# Patient Record
Sex: Male | Born: 1935 | Race: White | Hispanic: No | Smoking: Former smoker
Health system: Southern US, Community
[De-identification: ages and names within clinical notes are randomized; demographics above are authoritative.]

## PROBLEM LIST (undated history)

## (undated) DIAGNOSIS — I2510 Atherosclerotic heart disease of native coronary artery without angina pectoris: Secondary | ICD-10-CM

## (undated) DIAGNOSIS — I4891 Unspecified atrial fibrillation: Secondary | ICD-10-CM

## (undated) DIAGNOSIS — M1990 Unspecified osteoarthritis, unspecified site: Secondary | ICD-10-CM

## (undated) DIAGNOSIS — I509 Heart failure, unspecified: Secondary | ICD-10-CM

## (undated) DIAGNOSIS — Z789 Other specified health status: Secondary | ICD-10-CM

## (undated) DIAGNOSIS — F1090 Alcohol use, unspecified, uncomplicated: Secondary | ICD-10-CM

## (undated) DIAGNOSIS — Z7289 Other problems related to lifestyle: Secondary | ICD-10-CM

## (undated) DIAGNOSIS — I639 Cerebral infarction, unspecified: Secondary | ICD-10-CM

## (undated) DIAGNOSIS — E785 Hyperlipidemia, unspecified: Secondary | ICD-10-CM

## (undated) DIAGNOSIS — I10 Essential (primary) hypertension: Secondary | ICD-10-CM

## (undated) DIAGNOSIS — J449 Chronic obstructive pulmonary disease, unspecified: Secondary | ICD-10-CM

## (undated) HISTORY — DX: Alcohol use, unspecified, uncomplicated: F10.90

## (undated) HISTORY — DX: Other specified health status: Z78.9

## (undated) HISTORY — DX: Other problems related to lifestyle: Z72.89

## (undated) HISTORY — DX: Hyperlipidemia, unspecified: E78.5

## (undated) HISTORY — PX: TONSILLECTOMY: SUR1361

## (undated) HISTORY — PX: NECK SURGERY: SHX720

## (undated) HISTORY — DX: Unspecified atrial fibrillation: I48.91

## (undated) HISTORY — DX: Atherosclerotic heart disease of native coronary artery without angina pectoris: I25.10

## (undated) HISTORY — DX: Cerebral infarction, unspecified: I63.9

---

## 2003-11-08 ENCOUNTER — Other Ambulatory Visit: Payer: Self-pay

## 2005-01-15 ENCOUNTER — Emergency Department: Payer: Self-pay | Admitting: Emergency Medicine

## 2005-06-16 ENCOUNTER — Ambulatory Visit: Payer: Self-pay | Admitting: Internal Medicine

## 2005-08-20 ENCOUNTER — Ambulatory Visit: Payer: Self-pay | Admitting: Gastroenterology

## 2005-08-26 ENCOUNTER — Ambulatory Visit: Payer: Self-pay | Admitting: Internal Medicine

## 2005-09-01 ENCOUNTER — Emergency Department: Payer: Self-pay | Admitting: Emergency Medicine

## 2005-09-01 ENCOUNTER — Other Ambulatory Visit: Payer: Self-pay

## 2005-12-08 ENCOUNTER — Ambulatory Visit: Payer: Self-pay | Admitting: Pain Medicine

## 2005-12-22 ENCOUNTER — Ambulatory Visit: Payer: Self-pay | Admitting: Pain Medicine

## 2006-01-26 ENCOUNTER — Ambulatory Visit: Payer: Self-pay | Admitting: Pain Medicine

## 2006-02-08 ENCOUNTER — Ambulatory Visit: Payer: Self-pay | Admitting: Physician Assistant

## 2006-04-07 ENCOUNTER — Encounter: Admission: RE | Admit: 2006-04-07 | Discharge: 2006-04-07 | Payer: Self-pay | Admitting: Neurosurgery

## 2006-06-06 ENCOUNTER — Emergency Department: Payer: Self-pay | Admitting: Emergency Medicine

## 2006-06-15 ENCOUNTER — Observation Stay (HOSPITAL_COMMUNITY): Admission: RE | Admit: 2006-06-15 | Discharge: 2006-06-16 | Payer: Self-pay | Admitting: Neurosurgery

## 2006-07-12 ENCOUNTER — Encounter: Admission: RE | Admit: 2006-07-12 | Discharge: 2006-07-12 | Payer: Self-pay | Admitting: Neurosurgery

## 2006-09-16 ENCOUNTER — Ambulatory Visit: Payer: Self-pay | Admitting: Neurosurgery

## 2006-09-21 ENCOUNTER — Ambulatory Visit: Payer: Self-pay | Admitting: Pain Medicine

## 2006-10-08 ENCOUNTER — Inpatient Hospital Stay (HOSPITAL_COMMUNITY): Admission: RE | Admit: 2006-10-08 | Discharge: 2006-10-11 | Payer: Self-pay | Admitting: Neurosurgery

## 2006-10-27 ENCOUNTER — Ambulatory Visit: Payer: Self-pay | Admitting: Neurosurgery

## 2006-11-19 ENCOUNTER — Encounter: Admission: RE | Admit: 2006-11-19 | Discharge: 2006-11-19 | Payer: Self-pay | Admitting: Neurosurgery

## 2007-12-01 ENCOUNTER — Ambulatory Visit: Payer: Self-pay | Admitting: Internal Medicine

## 2009-01-02 ENCOUNTER — Ambulatory Visit: Payer: Self-pay | Admitting: Internal Medicine

## 2010-08-22 ENCOUNTER — Emergency Department: Payer: Self-pay | Admitting: Emergency Medicine

## 2010-08-26 NOTE — Op Note (Signed)
Scott Richard, Scott Richard              ACCOUNT NO.:  [id number]   MEDICAL RECORD NO.:  [id number]          PATIENT TYPE:  INP   LOCATION:  3310                         FACILITY:  [facility]   PHYSICIAN:  Donalee Citrin, M.D.        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   PROCEDURE:  Second phase is a posterior cervical fusion from C4 down to  T1 with the Vertex lateral mass system from C4 to C7 with pedicle screw  placed at T1 and bilateral laminotomies at C7-T1 to facilitate pedicle  screw placement.   SURGEON:  Donalee Citrin, M.D.   ASSISTANT:  Reinaldo Meeker, M.D.   ANESTHESIA:  General endotracheal anesthesia.   HISTORY OF PRESENT ILLNESS:  For details of the HPI and the first stage  of the operation were dictated in the previous operative note by Dr.  Aliene Beams. This is the second phase of the operation.   DESCRIPTION OF PROCEDURE:  After anterior cervical corpectomy and fusion  had been performed, the patient was transferred over and repositioned  prone in pins.  Immediate post positioning x-ray showed good alignment  of his corpectomy plate and trabecular metal strut. Then, the posterior  neck was prepped and draped in the usual sterile fashion.  An incision  was made extending from approximately C3 down to T2.  Subperiosteal  dissection was carried out at the lamina of C4, C5, C6, and T1.  The  lateral masses were then exposed at the same levels.  Intraoperative x-  ray confirmed localization of C4 spinous process and the C4 lateral  mass. Using the inferomedial quadrant of the lateral mass with  trajectory slightly deeper extending out to the superior lateral  quadrant, pilot holes were drilled with a high speed drill from C4 down  through C7.  Then, using a PPS drill with  12 mm guide, holes were  drilled first at C4 on the right.  This actually was caught up in a  posterior cervical bone spur so this was soft and had to be redirected,  repositioned, and a rescue screw had to be applied with a 14 mm rescue  screw at C4 on the right. The remainder of the lateral mass screws at  C5, C6, and C7 on the right as well as C4 through C7 on the left, were  all placed without event.  There was one rescue screw placed at C7 on  the patient's left side and this was a 14 mm 4.0 rescue screw.  The  remainder of the screws were 3.5 mm x 14 mm in the lateral masses.  All  screws had excellent purchase.  After this was completed, laminotomies  were performed to identify the T1 pedicle.  The C8 nerve root was also  identified and the C8 foramen was identified to get a field for the  superior aspect of the T1 pedicle.  Then using  fluoroscopy initially to  gauge trajectory, although fluoroscopic imaging was very poor at this  level, but using direct inspection of the pedicle through the laminotomy  defect, an 18 mm hole was hand drilled, probed each step along the way  to confirm good bony purchase and to be within the pedicle on each side.  Then, the neocortex was tapped and an 18 mm pedicle screw was inserted  on the left and right side with excellent purchase.  Both screws  appeared to have good trajectory based on the limited imaging we could  get at this level.  All screws had excellent purchase.  The C8 neural  foramen appeared to be unviolated due to probing with a nerve hook.  After this was performed, and all ten screws had been placed, the wound  was copiously irrigated and aggressive decortication was carried out at  the lateral masses and the lamina.  Then, rods were fashioned,  contoured, and inserted.  The top loading nuts were then torqued down  with a counter torque wrench to white knuckle tight and metal fatigue.  Then, the matrix bone substitute combined with  BMP was laid along the  lateral masses and lamina.  A Blake drain was inserted and the wound was  closed in layers with interrupted Vicryl with a running for subcuticular  for the skin. Prior to closure, fluoroscopy confirmed good positioning  of all screws and hardware and then post closure, all needle and sponge  counts were correct.           ______________________________  Donalee Citrin, M.D.     GC/MEDQ  D:  10/08/2006  T:  10/09/2006  Job:  [id number]

## 2010-08-26 NOTE — Discharge Summary (Signed)
[name], Scott Richard              ACCOUNT NO.:  [id number]   MEDICAL RECORD NO.:  [id number]          PATIENT TYPE:  INP   LOCATION:  3005                         FACILITY:  [facility]   PHYSICIAN:  Reinaldo Meeker, M.D. 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   PRIMARY OPERATIVE PROCEDURE:  Anterior cervical corpectomy with strut  graft followed by posterior cervical fusion with instrumentation.   HISTORY:  [name] is a 75 year old gentleman who underwent an  anterior cervical diskectomy a few months ago.  Unfortunately, he had  some collapse of his anterior column and developed marked kyphosis.  After discussing the options, it was elected to come in from the front,  take out his instrumentation, do a corpectomy, try and get some  extension and then put some instrumentation in from the back as well.  The patient understood the risks and benefits, and had an opportunity to  ask questions, wished to proceed, and was admitted at this time for the  above-mentioned procedure.   On October 08, 2006, the patient was taken to the operating room and  underwent the previously mentioned operation, tolerated it well, and had  an unremarkable postoperative course.  Over, subsequent days, he was  able to increase activity and advance his diet without difficulty.  His  wounds appear to be healing well.  It was felt that he could be  discharged home on October 11, 2006.  He was instructed to wear his brace  at all times.  His condition was markedly improved versus admission.  His positioning was also much better.           ______________________________  Reinaldo Meeker, M.D.     ROK/MEDQ  D:  11/11/2006  T:  11/11/2006  Job:  [id number]

## 2010-08-26 NOTE — Op Note (Signed)
NAMEARTAVIS, COWIE              ACCOUNT NO.:  [id number]   MEDICAL RECORD NO.:  [id number]          PATIENT TYPE:  INP   LOCATION:  3310                         FACILITY:  [facility]   PHYSICIAN:  Reinaldo Meeker, M.D. DATE OF BIRTH:  15-Mar-1936   DATE OF PROCEDURE:  10/08/2006  DATE OF DISCHARGE:                               OPERATIVE REPORT   PREOPERATIVE DIAGNOSIS:  Kyphotic deformity cervical spine, status post  anterior cervical diskectomy with fusion and plating.   POSTOPERATIVE DIAGNOSIS:  Kyphotic deformity cervical spine, status post  anterior cervical diskectomy with fusion and plating.   PROCEDURE:  Exploration of anterior cervical fusion C5-6, C6-7, followed  by removal of anterior cervical hardware C5-6, C6-7, followed by C5, C6,  and C7 anterior cervical corpectomy, followed by C4-T1 anterior cervical  fusion with trabecular metal strut graft. followed by anterior cervical  plating C4-T1.   SURGEON:  Reinaldo Meeker, M.D.   ASSISTANT:  Donalee Citrin, M.D.   PROCEDURE IN DETAIL:  After being placed in supine position in 5 pounds  of halter traction, the patient's neck was prepped and draped in the  usual sterile fashion.  Localizing fluoroscopy was used prior to  incision to identify the appropriate level.  Transverse incision was  made in the right anterior neck, started at the midline and headed  towards the medial aspect of the sternocleidomastoid muscle.  The  platysma muscle was then incised transversely, and the natural fascial  plane between the strap muscles medially and sternocleidomastoid  laterally was identified and followed down to the anterior aspect of the  cervical spine.  There was a lot of reactive tissue, and this was  dissected free until the midline of the spine was identified, and the  previous anterior cervical plate was visualized.  This was then removed.  Inspection of the fusions at C5-6 and C6-7 found the fusions to be  solid, with the  kyphotic deformity quite fixed.  A self-retaining  retractor was placed for exposure.  Starting at the C5 area, a high-  speed drill was used to perform a progressively deeper corpectomy, and  this was carried down through C6.  C7 was also found to be greatly  damaged, and it was elected to remove that as well in order to give a  good place for the strut graft to be supported on the top surface of T1.  When the bone had been drilled to a thin layer, it was removed in a  piecemeal fashion and gave excellent decompression of the underlying  spinal dura.  Generous lateral decompression was carried out  bilaterally.  Any bleeding was controlled with bipolar coagulation.  The  surfaces of C4 and T1 were then drilled to make parallel surfaces so  that the strut graft will be a good fit.  A 50 mm strut graft was then  chosen.  After getting once more confirmed hemostasis, the strut was  impacted, and fluoroscopy showed it to be in reasonably good position.  Because of the kyphotic deformity, it was hard to get a perfectly flush  fit at both ends,  but it was felt to be a good result.  An appropriate  length ventral anterior cervical plate was then chosen.  Under  fluoroscopic guidance, drill holes were placed, followed by placement of  15 mm screws at C4 and T1.  Fluoroscopy showed the upper screws to be in  good position.  It was hard to visualize the lower screws, but they  appeared to be good under direct visualization.  At this time, large  amounts of irrigation were carried out and bleeding controlled with  bipolar coagulation.  An epidural drain was brought in through a  separate stab wound incision and left anterior to the anterior cervical  plate.  The wound was then closed with interrupted Vicryl on the  platysma muscle, inverted 5-0  PDS on the subcuticular layer, and Steri-Strips on the skin.  A sterile  dressing was then applied.  The patient was flipped over for his  posterior  cervical fusion.    The rest of the dictation with the posterior cervical fusion will be  dictated by Dr. Wynetta Emery.           ______________________________  Reinaldo Meeker, M.D.     ROK/MEDQ  D:  10/08/2006  T:  10/09/2006  Job:  [id number]

## 2010-08-29 NOTE — Op Note (Signed)
Scott Richard, Scott Richard              ACCOUNT NO.:  [id number]   MEDICAL RECORD NO.:  [id number]          PATIENT TYPE:  INP   LOCATION:  2899                         FACILITY:  [facility]   PHYSICIAN:  Reinaldo Meeker, M.D. DATE OF BIRTH:  [birth year]   DATE OF PROCEDURE:  06/15/2006  DATE OF DISCHARGE:                               OPERATIVE REPORT   PREOPERATIVE DIAGNOSES:  Herniated disk and spinal stenosis, C5-6, C6-7.   POSTOPERATIVE DIAGNOSES:  Herniated disk and spinal stenosis, C5-6, C6-  7.   PROCEDURES:  C5-6 and C6-7 anterior cervical diskectomy with bone bank  fusion, followed by C5-6 and C6-7 anterior cervical plating with AcuFix  anterior cervical plate.   SURGEON:  Reinaldo Meeker, M.D.   ASSISTANT:  Kathaleen Maser. Pool, M.D.   PROCEDURES IN DETAIL:  After being placed in a supine position in 5  pounds of halter traction, the patient's neck was prepped and draped in  the usual sterile fashion.  Localizing fluoroscopy was used prior to  incision to identify the appropriate level.  A transverse incision was  made in the right anterior neck, starting at the midline and heading  towards the medial aspect of the sternocleidomastoid muscle.  The  platysma muscle was then incised transversely.  The natural fascial  plane between the strap muscles medially and the sternocleidomastoid  muscle laterally was identified and followed down to the anterior aspect  of the cervical spine.  The longus colli muscles were identified and  split in the midline and stripped away bilaterally with the Chief Operating Officer.  A self-retaining retractor was placed for  exposure and x-rays showed approach to the appropriate level.  Using a  15 blade, the annulus and disk at C5-6 and C6-7 were incised.  There was  tremendous bony overgrowth, and this was drilled down with the high-  speed drill until the C5, C6 and C7 vertebra were on a single plane.  Disks were then identified and  followed down with a drill due to the  fact they were so markedly collapsed.  This was done at both levels  until approximately 95% of the disk material and disk space had been  drilled open at both levels.  At this time, the microscope was draped,  brought into the field and used for the remainder of the case.  Starting  at C5-6, the remainder of the bony overgrowth was removed down to the  posterior cortical margin.  Bony spurs were then removed to decompress  the spinal dura and proximal foramina bilaterally.  There was a great  deal of bony overgrowth, but this was well decompressed.  Inspection  showed no evidence of residual compression.  A similar procedure was  then carried out at C6-7, once again removing the remainder of the bony  overgrowth until the spinal dura and proximal nerve roots were  identified bilaterally.  At this time, inspection was carried out at  both levels in all directions without any evidence of residual  compression that could be identified.  A large amount of irrigation was  carried out.  Measurements were taken and two 8-mm bone bank plugs were  reconstituted.  After irrigating once more and confirming hemostasis,  the plugs were impacted without difficulty, and fluoroscopy showed them  to be in good position.  An appropriate length AcuFix anterior cervical  plate was then chosen.  Under fluoroscopic guidance, drill holes were  then placed, followed by placement of 4 screws, 2 each in C5 and C7.  The C6 vertebral body was very flattened and there was very little on  the left.  It was felt that if an attempt had been made to put the  screws in, it would very likely have fractured the bone.  It was,  therefore, elected not to put screws into C6.  At this time, large  amounts of irrigation were carried out.  Any bleeding was controlled  with Bovie coagulation.  Final fluoroscopy showed the plate, screws and  plug to all be in good position.  Due to a bit of  ooze in the  prevertebral space, a Jackson-Pratt drain was brought in and left in the  prevertebral space.  The wound was then closed with interrupted  Vicryl on the platysma muscle, interrupted 5-0 PDS in the subcuticular  layer, and Steri-Strips on the skin.  Sterile dressings and a soft  collar were applied and the patient was extubated and taken to the  recovery room in stable condition.           ______________________________  Reinaldo Meeker, M.D.     ROK/MEDQ  D:  06/15/2006  T:  06/15/2006  Job:  [id number]   cc:   Henry A. Pool, M.D.

## 2011-01-28 LAB — BASIC METABOLIC PANEL
BUN: 18
Chloride: 102
Creatinine, Ser: 0.72
GFR calc non Af Amer: >60
Glucose, Bld: 92

## 2011-01-28 LAB — CBC
Hemoglobin: 14.4
MCHC: 34
Platelets: 271
RBC: 4.67

## 2011-12-25 ENCOUNTER — Ambulatory Visit: Payer: Self-pay | Admitting: Internal Medicine

## 2014-04-20 DIAGNOSIS — J449 Chronic obstructive pulmonary disease, unspecified: Secondary | ICD-10-CM | POA: Diagnosis not present

## 2014-05-14 DIAGNOSIS — I6789 Other cerebrovascular disease: Secondary | ICD-10-CM | POA: Diagnosis not present

## 2014-05-14 DIAGNOSIS — M5382 Other specified dorsopathies, cervical region: Secondary | ICD-10-CM | POA: Diagnosis not present

## 2014-05-21 DIAGNOSIS — J449 Chronic obstructive pulmonary disease, unspecified: Secondary | ICD-10-CM | POA: Diagnosis not present

## 2014-05-23 DIAGNOSIS — M79674 Pain in right toe(s): Secondary | ICD-10-CM | POA: Diagnosis not present

## 2014-05-23 DIAGNOSIS — M79675 Pain in left toe(s): Secondary | ICD-10-CM | POA: Diagnosis not present

## 2014-05-23 DIAGNOSIS — B351 Tinea unguium: Secondary | ICD-10-CM | POA: Diagnosis not present

## 2014-06-19 DIAGNOSIS — J449 Chronic obstructive pulmonary disease, unspecified: Secondary | ICD-10-CM | POA: Diagnosis not present

## 2014-07-06 DIAGNOSIS — J449 Chronic obstructive pulmonary disease, unspecified: Secondary | ICD-10-CM | POA: Diagnosis not present

## 2014-07-12 DIAGNOSIS — F17210 Nicotine dependence, cigarettes, uncomplicated: Secondary | ICD-10-CM | POA: Diagnosis not present

## 2014-07-12 DIAGNOSIS — R300 Dysuria: Secondary | ICD-10-CM | POA: Diagnosis not present

## 2014-07-12 DIAGNOSIS — Z0001 Encounter for general adult medical examination with abnormal findings: Secondary | ICD-10-CM | POA: Diagnosis not present

## 2014-07-12 DIAGNOSIS — J440 Chronic obstructive pulmonary disease with acute lower respiratory infection: Secondary | ICD-10-CM | POA: Diagnosis not present

## 2014-07-12 DIAGNOSIS — I10 Essential (primary) hypertension: Secondary | ICD-10-CM | POA: Diagnosis not present

## 2014-07-12 DIAGNOSIS — F411 Generalized anxiety disorder: Secondary | ICD-10-CM | POA: Diagnosis not present

## 2014-07-12 DIAGNOSIS — R05 Cough: Secondary | ICD-10-CM | POA: Diagnosis not present

## 2014-07-20 DIAGNOSIS — J449 Chronic obstructive pulmonary disease, unspecified: Secondary | ICD-10-CM | POA: Diagnosis not present

## 2014-08-09 DIAGNOSIS — F17210 Nicotine dependence, cigarettes, uncomplicated: Secondary | ICD-10-CM | POA: Diagnosis not present

## 2014-08-09 DIAGNOSIS — J449 Chronic obstructive pulmonary disease, unspecified: Secondary | ICD-10-CM | POA: Diagnosis not present

## 2014-08-19 DIAGNOSIS — J449 Chronic obstructive pulmonary disease, unspecified: Secondary | ICD-10-CM | POA: Diagnosis not present

## 2014-08-27 DIAGNOSIS — M150 Primary generalized (osteo)arthritis: Secondary | ICD-10-CM | POA: Diagnosis not present

## 2014-08-27 DIAGNOSIS — J449 Chronic obstructive pulmonary disease, unspecified: Secondary | ICD-10-CM | POA: Diagnosis not present

## 2014-08-27 DIAGNOSIS — M154 Erosive (osteo)arthritis: Secondary | ICD-10-CM | POA: Diagnosis not present

## 2014-08-27 DIAGNOSIS — M545 Low back pain: Secondary | ICD-10-CM | POA: Diagnosis not present

## 2014-08-27 DIAGNOSIS — G8921 Chronic pain due to trauma: Secondary | ICD-10-CM | POA: Diagnosis not present

## 2014-08-27 DIAGNOSIS — F1120 Opioid dependence, uncomplicated: Secondary | ICD-10-CM | POA: Diagnosis not present

## 2014-08-27 DIAGNOSIS — M542 Cervicalgia: Secondary | ICD-10-CM | POA: Diagnosis not present

## 2014-08-27 DIAGNOSIS — G8929 Other chronic pain: Secondary | ICD-10-CM | POA: Diagnosis not present

## 2014-09-19 DIAGNOSIS — J449 Chronic obstructive pulmonary disease, unspecified: Secondary | ICD-10-CM | POA: Diagnosis not present

## 2014-10-10 DIAGNOSIS — R0602 Shortness of breath: Secondary | ICD-10-CM | POA: Diagnosis not present

## 2014-10-19 DIAGNOSIS — J449 Chronic obstructive pulmonary disease, unspecified: Secondary | ICD-10-CM | POA: Diagnosis not present

## 2014-11-12 DIAGNOSIS — F17210 Nicotine dependence, cigarettes, uncomplicated: Secondary | ICD-10-CM | POA: Diagnosis not present

## 2014-11-12 DIAGNOSIS — R0602 Shortness of breath: Secondary | ICD-10-CM | POA: Diagnosis not present

## 2014-11-12 DIAGNOSIS — J069 Acute upper respiratory infection, unspecified: Secondary | ICD-10-CM | POA: Diagnosis not present

## 2014-11-12 DIAGNOSIS — J440 Chronic obstructive pulmonary disease with acute lower respiratory infection: Secondary | ICD-10-CM | POA: Diagnosis not present

## 2014-11-12 DIAGNOSIS — I10 Essential (primary) hypertension: Secondary | ICD-10-CM | POA: Diagnosis not present

## 2014-11-12 DIAGNOSIS — Z23 Encounter for immunization: Secondary | ICD-10-CM | POA: Diagnosis not present

## 2014-11-12 DIAGNOSIS — J441 Chronic obstructive pulmonary disease with (acute) exacerbation: Secondary | ICD-10-CM | POA: Diagnosis not present

## 2014-11-12 DIAGNOSIS — J309 Allergic rhinitis, unspecified: Secondary | ICD-10-CM | POA: Diagnosis not present

## 2014-12-10 DIAGNOSIS — M79675 Pain in left toe(s): Secondary | ICD-10-CM | POA: Diagnosis not present

## 2014-12-10 DIAGNOSIS — M79674 Pain in right toe(s): Secondary | ICD-10-CM | POA: Diagnosis not present

## 2014-12-10 DIAGNOSIS — B351 Tinea unguium: Secondary | ICD-10-CM | POA: Diagnosis not present

## 2014-12-24 DIAGNOSIS — Z79899 Other long term (current) drug therapy: Secondary | ICD-10-CM | POA: Diagnosis not present

## 2014-12-24 DIAGNOSIS — M542 Cervicalgia: Secondary | ICD-10-CM | POA: Diagnosis not present

## 2014-12-24 DIAGNOSIS — J449 Chronic obstructive pulmonary disease, unspecified: Secondary | ICD-10-CM | POA: Diagnosis not present

## 2014-12-24 DIAGNOSIS — M545 Low back pain: Secondary | ICD-10-CM | POA: Diagnosis not present

## 2014-12-24 DIAGNOSIS — M154 Erosive (osteo)arthritis: Secondary | ICD-10-CM | POA: Diagnosis not present

## 2014-12-24 DIAGNOSIS — M150 Primary generalized (osteo)arthritis: Secondary | ICD-10-CM | POA: Diagnosis not present

## 2014-12-24 DIAGNOSIS — G8929 Other chronic pain: Secondary | ICD-10-CM | POA: Diagnosis not present

## 2014-12-24 DIAGNOSIS — G8921 Chronic pain due to trauma: Secondary | ICD-10-CM | POA: Diagnosis not present

## 2014-12-24 DIAGNOSIS — F1120 Opioid dependence, uncomplicated: Secondary | ICD-10-CM | POA: Diagnosis not present

## 2015-01-14 DIAGNOSIS — J449 Chronic obstructive pulmonary disease, unspecified: Secondary | ICD-10-CM | POA: Diagnosis not present

## 2015-01-14 DIAGNOSIS — F17211 Nicotine dependence, cigarettes, in remission: Secondary | ICD-10-CM | POA: Diagnosis not present

## 2015-01-14 DIAGNOSIS — Z23 Encounter for immunization: Secondary | ICD-10-CM | POA: Diagnosis not present

## 2015-02-25 DIAGNOSIS — J449 Chronic obstructive pulmonary disease, unspecified: Secondary | ICD-10-CM | POA: Diagnosis not present

## 2015-03-21 DIAGNOSIS — F17210 Nicotine dependence, cigarettes, uncomplicated: Secondary | ICD-10-CM | POA: Diagnosis not present

## 2015-03-21 DIAGNOSIS — F411 Generalized anxiety disorder: Secondary | ICD-10-CM | POA: Diagnosis not present

## 2015-03-21 DIAGNOSIS — J440 Chronic obstructive pulmonary disease with acute lower respiratory infection: Secondary | ICD-10-CM | POA: Diagnosis not present

## 2015-03-21 DIAGNOSIS — R0602 Shortness of breath: Secondary | ICD-10-CM | POA: Diagnosis not present

## 2015-03-28 DIAGNOSIS — J449 Chronic obstructive pulmonary disease, unspecified: Secondary | ICD-10-CM | POA: Diagnosis not present

## 2015-04-26 DIAGNOSIS — J449 Chronic obstructive pulmonary disease, unspecified: Secondary | ICD-10-CM | POA: Diagnosis not present

## 2015-04-26 DIAGNOSIS — M542 Cervicalgia: Secondary | ICD-10-CM | POA: Diagnosis not present

## 2015-04-26 DIAGNOSIS — F1120 Opioid dependence, uncomplicated: Secondary | ICD-10-CM | POA: Diagnosis not present

## 2015-04-26 DIAGNOSIS — M154 Erosive (osteo)arthritis: Secondary | ICD-10-CM | POA: Diagnosis not present

## 2015-04-26 DIAGNOSIS — M150 Primary generalized (osteo)arthritis: Secondary | ICD-10-CM | POA: Diagnosis not present

## 2015-04-26 DIAGNOSIS — G8929 Other chronic pain: Secondary | ICD-10-CM | POA: Diagnosis not present

## 2015-04-26 DIAGNOSIS — M545 Low back pain: Secondary | ICD-10-CM | POA: Diagnosis not present

## 2015-04-26 DIAGNOSIS — G8921 Chronic pain due to trauma: Secondary | ICD-10-CM | POA: Diagnosis not present

## 2015-04-30 DIAGNOSIS — J449 Chronic obstructive pulmonary disease, unspecified: Secondary | ICD-10-CM | POA: Diagnosis not present

## 2015-05-13 DIAGNOSIS — J449 Chronic obstructive pulmonary disease, unspecified: Secondary | ICD-10-CM | POA: Diagnosis not present

## 2015-05-13 DIAGNOSIS — F17211 Nicotine dependence, cigarettes, in remission: Secondary | ICD-10-CM | POA: Diagnosis not present

## 2015-05-13 DIAGNOSIS — J9611 Chronic respiratory failure with hypoxia: Secondary | ICD-10-CM | POA: Diagnosis not present

## 2015-05-13 DIAGNOSIS — R0602 Shortness of breath: Secondary | ICD-10-CM | POA: Diagnosis not present

## 2015-05-31 DIAGNOSIS — M6281 Muscle weakness (generalized): Secondary | ICD-10-CM | POA: Diagnosis not present

## 2015-05-31 DIAGNOSIS — E639 Nutritional deficiency, unspecified: Secondary | ICD-10-CM | POA: Diagnosis not present

## 2015-05-31 DIAGNOSIS — I6990 Unspecified sequelae of unspecified cerebrovascular disease: Secondary | ICD-10-CM | POA: Diagnosis not present

## 2015-06-28 DIAGNOSIS — J440 Chronic obstructive pulmonary disease with acute lower respiratory infection: Secondary | ICD-10-CM | POA: Diagnosis not present

## 2015-06-28 DIAGNOSIS — E639 Nutritional deficiency, unspecified: Secondary | ICD-10-CM | POA: Diagnosis not present

## 2015-07-11 DIAGNOSIS — N390 Urinary tract infection, site not specified: Secondary | ICD-10-CM | POA: Diagnosis not present

## 2015-07-11 DIAGNOSIS — Z0001 Encounter for general adult medical examination with abnormal findings: Secondary | ICD-10-CM | POA: Diagnosis not present

## 2015-07-24 DIAGNOSIS — R0602 Shortness of breath: Secondary | ICD-10-CM | POA: Diagnosis not present

## 2015-07-24 DIAGNOSIS — Z0001 Encounter for general adult medical examination with abnormal findings: Secondary | ICD-10-CM | POA: Diagnosis not present

## 2015-07-24 DIAGNOSIS — F17210 Nicotine dependence, cigarettes, uncomplicated: Secondary | ICD-10-CM | POA: Diagnosis not present

## 2015-07-24 DIAGNOSIS — I6523 Occlusion and stenosis of bilateral carotid arteries: Secondary | ICD-10-CM | POA: Diagnosis not present

## 2015-07-24 DIAGNOSIS — J309 Allergic rhinitis, unspecified: Secondary | ICD-10-CM | POA: Diagnosis not present

## 2015-07-24 DIAGNOSIS — J449 Chronic obstructive pulmonary disease, unspecified: Secondary | ICD-10-CM | POA: Diagnosis not present

## 2015-07-24 DIAGNOSIS — I10 Essential (primary) hypertension: Secondary | ICD-10-CM | POA: Diagnosis not present

## 2015-07-24 DIAGNOSIS — Z1159 Encounter for screening for other viral diseases: Secondary | ICD-10-CM | POA: Diagnosis not present

## 2015-07-24 DIAGNOSIS — R251 Tremor, unspecified: Secondary | ICD-10-CM | POA: Diagnosis not present

## 2015-07-24 DIAGNOSIS — E782 Mixed hyperlipidemia: Secondary | ICD-10-CM | POA: Diagnosis not present

## 2015-07-29 DIAGNOSIS — E639 Nutritional deficiency, unspecified: Secondary | ICD-10-CM | POA: Diagnosis not present

## 2015-07-29 DIAGNOSIS — I6990 Unspecified sequelae of unspecified cerebrovascular disease: Secondary | ICD-10-CM | POA: Diagnosis not present

## 2015-07-29 DIAGNOSIS — M6281 Muscle weakness (generalized): Secondary | ICD-10-CM | POA: Diagnosis not present

## 2015-08-19 DIAGNOSIS — G8921 Chronic pain due to trauma: Secondary | ICD-10-CM | POA: Diagnosis not present

## 2015-08-19 DIAGNOSIS — M545 Low back pain: Secondary | ICD-10-CM | POA: Diagnosis not present

## 2015-08-19 DIAGNOSIS — G8929 Other chronic pain: Secondary | ICD-10-CM | POA: Diagnosis not present

## 2015-08-19 DIAGNOSIS — J449 Chronic obstructive pulmonary disease, unspecified: Secondary | ICD-10-CM | POA: Diagnosis not present

## 2015-08-19 DIAGNOSIS — M154 Erosive (osteo)arthritis: Secondary | ICD-10-CM | POA: Diagnosis not present

## 2015-08-19 DIAGNOSIS — M542 Cervicalgia: Secondary | ICD-10-CM | POA: Diagnosis not present

## 2015-08-19 DIAGNOSIS — F1120 Opioid dependence, uncomplicated: Secondary | ICD-10-CM | POA: Diagnosis not present

## 2015-08-19 DIAGNOSIS — M150 Primary generalized (osteo)arthritis: Secondary | ICD-10-CM | POA: Diagnosis not present

## 2015-08-28 DIAGNOSIS — M6281 Muscle weakness (generalized): Secondary | ICD-10-CM | POA: Diagnosis not present

## 2015-08-28 DIAGNOSIS — R0602 Shortness of breath: Secondary | ICD-10-CM | POA: Diagnosis not present

## 2015-08-28 DIAGNOSIS — E639 Nutritional deficiency, unspecified: Secondary | ICD-10-CM | POA: Diagnosis not present

## 2015-08-28 DIAGNOSIS — I6990 Unspecified sequelae of unspecified cerebrovascular disease: Secondary | ICD-10-CM | POA: Diagnosis not present

## 2015-08-29 DIAGNOSIS — M79674 Pain in right toe(s): Secondary | ICD-10-CM | POA: Diagnosis not present

## 2015-08-29 DIAGNOSIS — M79675 Pain in left toe(s): Secondary | ICD-10-CM | POA: Diagnosis not present

## 2015-08-29 DIAGNOSIS — B351 Tinea unguium: Secondary | ICD-10-CM | POA: Diagnosis not present

## 2015-09-04 DIAGNOSIS — I6523 Occlusion and stenosis of bilateral carotid arteries: Secondary | ICD-10-CM | POA: Diagnosis not present

## 2015-09-16 DIAGNOSIS — J449 Chronic obstructive pulmonary disease, unspecified: Secondary | ICD-10-CM | POA: Diagnosis not present

## 2015-09-16 DIAGNOSIS — J9611 Chronic respiratory failure with hypoxia: Secondary | ICD-10-CM | POA: Diagnosis not present

## 2015-09-16 DIAGNOSIS — F17210 Nicotine dependence, cigarettes, uncomplicated: Secondary | ICD-10-CM | POA: Diagnosis not present

## 2015-09-25 DIAGNOSIS — R0602 Shortness of breath: Secondary | ICD-10-CM | POA: Diagnosis not present

## 2015-10-03 DIAGNOSIS — I6990 Unspecified sequelae of unspecified cerebrovascular disease: Secondary | ICD-10-CM | POA: Diagnosis not present

## 2015-10-03 DIAGNOSIS — E639 Nutritional deficiency, unspecified: Secondary | ICD-10-CM | POA: Diagnosis not present

## 2015-10-03 DIAGNOSIS — M6281 Muscle weakness (generalized): Secondary | ICD-10-CM | POA: Diagnosis not present

## 2015-10-22 DIAGNOSIS — J449 Chronic obstructive pulmonary disease, unspecified: Secondary | ICD-10-CM | POA: Diagnosis not present

## 2015-10-22 DIAGNOSIS — F17210 Nicotine dependence, cigarettes, uncomplicated: Secondary | ICD-10-CM | POA: Diagnosis not present

## 2015-10-22 DIAGNOSIS — R0602 Shortness of breath: Secondary | ICD-10-CM | POA: Diagnosis not present

## 2015-10-22 DIAGNOSIS — I6523 Occlusion and stenosis of bilateral carotid arteries: Secondary | ICD-10-CM | POA: Diagnosis not present

## 2015-11-04 DIAGNOSIS — E639 Nutritional deficiency, unspecified: Secondary | ICD-10-CM | POA: Diagnosis not present

## 2015-11-04 DIAGNOSIS — M6281 Muscle weakness (generalized): Secondary | ICD-10-CM | POA: Diagnosis not present

## 2015-11-04 DIAGNOSIS — I6990 Unspecified sequelae of unspecified cerebrovascular disease: Secondary | ICD-10-CM | POA: Diagnosis not present

## 2015-12-02 DIAGNOSIS — F1120 Opioid dependence, uncomplicated: Secondary | ICD-10-CM | POA: Diagnosis not present

## 2015-12-02 DIAGNOSIS — M154 Erosive (osteo)arthritis: Secondary | ICD-10-CM | POA: Diagnosis not present

## 2015-12-02 DIAGNOSIS — G8921 Chronic pain due to trauma: Secondary | ICD-10-CM | POA: Diagnosis not present

## 2015-12-02 DIAGNOSIS — M542 Cervicalgia: Secondary | ICD-10-CM | POA: Diagnosis not present

## 2015-12-02 DIAGNOSIS — M150 Primary generalized (osteo)arthritis: Secondary | ICD-10-CM | POA: Diagnosis not present

## 2015-12-02 DIAGNOSIS — M545 Low back pain: Secondary | ICD-10-CM | POA: Diagnosis not present

## 2015-12-02 DIAGNOSIS — J449 Chronic obstructive pulmonary disease, unspecified: Secondary | ICD-10-CM | POA: Diagnosis not present

## 2015-12-02 DIAGNOSIS — G8929 Other chronic pain: Secondary | ICD-10-CM | POA: Diagnosis not present

## 2015-12-05 DIAGNOSIS — I6990 Unspecified sequelae of unspecified cerebrovascular disease: Secondary | ICD-10-CM | POA: Diagnosis not present

## 2015-12-05 DIAGNOSIS — E639 Nutritional deficiency, unspecified: Secondary | ICD-10-CM | POA: Diagnosis not present

## 2015-12-05 DIAGNOSIS — M6281 Muscle weakness (generalized): Secondary | ICD-10-CM | POA: Diagnosis not present

## 2016-01-07 DIAGNOSIS — I6990 Unspecified sequelae of unspecified cerebrovascular disease: Secondary | ICD-10-CM | POA: Diagnosis not present

## 2016-01-07 DIAGNOSIS — E639 Nutritional deficiency, unspecified: Secondary | ICD-10-CM | POA: Diagnosis not present

## 2016-01-07 DIAGNOSIS — M6281 Muscle weakness (generalized): Secondary | ICD-10-CM | POA: Diagnosis not present

## 2016-02-10 DIAGNOSIS — E639 Nutritional deficiency, unspecified: Secondary | ICD-10-CM | POA: Diagnosis not present

## 2016-02-10 DIAGNOSIS — M6281 Muscle weakness (generalized): Secondary | ICD-10-CM | POA: Diagnosis not present

## 2016-02-10 DIAGNOSIS — I6990 Unspecified sequelae of unspecified cerebrovascular disease: Secondary | ICD-10-CM | POA: Diagnosis not present

## 2016-02-20 DIAGNOSIS — J441 Chronic obstructive pulmonary disease with (acute) exacerbation: Secondary | ICD-10-CM | POA: Diagnosis not present

## 2016-02-20 DIAGNOSIS — J309 Allergic rhinitis, unspecified: Secondary | ICD-10-CM | POA: Diagnosis not present

## 2016-02-20 DIAGNOSIS — F17210 Nicotine dependence, cigarettes, uncomplicated: Secondary | ICD-10-CM | POA: Diagnosis not present

## 2016-02-20 DIAGNOSIS — R251 Tremor, unspecified: Secondary | ICD-10-CM | POA: Diagnosis not present

## 2016-02-20 DIAGNOSIS — F411 Generalized anxiety disorder: Secondary | ICD-10-CM | POA: Diagnosis not present

## 2016-02-20 DIAGNOSIS — I10 Essential (primary) hypertension: Secondary | ICD-10-CM | POA: Diagnosis not present

## 2016-03-12 DIAGNOSIS — M6281 Muscle weakness (generalized): Secondary | ICD-10-CM | POA: Diagnosis not present

## 2016-03-12 DIAGNOSIS — I6990 Unspecified sequelae of unspecified cerebrovascular disease: Secondary | ICD-10-CM | POA: Diagnosis not present

## 2016-03-12 DIAGNOSIS — E639 Nutritional deficiency, unspecified: Secondary | ICD-10-CM | POA: Diagnosis not present

## 2016-03-30 DIAGNOSIS — F1120 Opioid dependence, uncomplicated: Secondary | ICD-10-CM | POA: Diagnosis not present

## 2016-03-30 DIAGNOSIS — M545 Low back pain: Secondary | ICD-10-CM | POA: Diagnosis not present

## 2016-03-30 DIAGNOSIS — M154 Erosive (osteo)arthritis: Secondary | ICD-10-CM | POA: Diagnosis not present

## 2016-03-30 DIAGNOSIS — G8929 Other chronic pain: Secondary | ICD-10-CM | POA: Diagnosis not present

## 2016-03-30 DIAGNOSIS — Z79899 Other long term (current) drug therapy: Secondary | ICD-10-CM | POA: Diagnosis not present

## 2016-03-30 DIAGNOSIS — M150 Primary generalized (osteo)arthritis: Secondary | ICD-10-CM | POA: Diagnosis not present

## 2016-03-30 DIAGNOSIS — G8921 Chronic pain due to trauma: Secondary | ICD-10-CM | POA: Diagnosis not present

## 2016-03-30 DIAGNOSIS — J449 Chronic obstructive pulmonary disease, unspecified: Secondary | ICD-10-CM | POA: Diagnosis not present

## 2016-03-30 DIAGNOSIS — M542 Cervicalgia: Secondary | ICD-10-CM | POA: Diagnosis not present

## 2016-04-14 DIAGNOSIS — M6281 Muscle weakness (generalized): Secondary | ICD-10-CM | POA: Diagnosis not present

## 2016-04-14 DIAGNOSIS — E639 Nutritional deficiency, unspecified: Secondary | ICD-10-CM | POA: Diagnosis not present

## 2016-04-14 DIAGNOSIS — I6990 Unspecified sequelae of unspecified cerebrovascular disease: Secondary | ICD-10-CM | POA: Diagnosis not present

## 2016-05-16 DIAGNOSIS — I6990 Unspecified sequelae of unspecified cerebrovascular disease: Secondary | ICD-10-CM | POA: Diagnosis not present

## 2016-05-16 DIAGNOSIS — E639 Nutritional deficiency, unspecified: Secondary | ICD-10-CM | POA: Diagnosis not present

## 2016-05-16 DIAGNOSIS — M6281 Muscle weakness (generalized): Secondary | ICD-10-CM | POA: Diagnosis not present

## 2016-06-17 ENCOUNTER — Emergency Department: Payer: Medicare HMO

## 2016-06-17 ENCOUNTER — Encounter: Payer: Self-pay | Admitting: Emergency Medicine

## 2016-06-17 ENCOUNTER — Inpatient Hospital Stay
Admission: EM | Admit: 2016-06-17 | Discharge: 2016-06-22 | DRG: 190 | Disposition: A | Discharge status: Home Health | Payer: Medicare HMO | Attending: Internal Medicine | Admitting: Internal Medicine

## 2016-06-17 DIAGNOSIS — J9621 Acute and chronic respiratory failure with hypoxia: Secondary | ICD-10-CM | POA: Diagnosis not present

## 2016-06-17 DIAGNOSIS — I10 Essential (primary) hypertension: Secondary | ICD-10-CM | POA: Diagnosis not present

## 2016-06-17 DIAGNOSIS — R05 Cough: Secondary | ICD-10-CM | POA: Diagnosis not present

## 2016-06-17 DIAGNOSIS — G250 Essential tremor: Secondary | ICD-10-CM | POA: Diagnosis present

## 2016-06-17 DIAGNOSIS — G2581 Restless legs syndrome: Secondary | ICD-10-CM | POA: Diagnosis present

## 2016-06-17 DIAGNOSIS — W109XXA Fall (on) (from) unspecified stairs and steps, initial encounter: Secondary | ICD-10-CM | POA: Diagnosis present

## 2016-06-17 DIAGNOSIS — J441 Chronic obstructive pulmonary disease with (acute) exacerbation: Secondary | ICD-10-CM | POA: Diagnosis not present

## 2016-06-17 DIAGNOSIS — F17200 Nicotine dependence, unspecified, uncomplicated: Secondary | ICD-10-CM | POA: Diagnosis present

## 2016-06-17 DIAGNOSIS — J9601 Acute respiratory failure with hypoxia: Secondary | ICD-10-CM

## 2016-06-17 DIAGNOSIS — J189 Pneumonia, unspecified organism: Secondary | ICD-10-CM | POA: Diagnosis present

## 2016-06-17 DIAGNOSIS — M1990 Unspecified osteoarthritis, unspecified site: Secondary | ICD-10-CM | POA: Diagnosis not present

## 2016-06-17 DIAGNOSIS — J440 Chronic obstructive pulmonary disease with acute lower respiratory infection: Secondary | ICD-10-CM | POA: Diagnosis not present

## 2016-06-17 DIAGNOSIS — S2232XA Fracture of one rib, left side, initial encounter for closed fracture: Secondary | ICD-10-CM | POA: Diagnosis present

## 2016-06-17 DIAGNOSIS — J9622 Acute and chronic respiratory failure with hypercapnia: Secondary | ICD-10-CM | POA: Diagnosis not present

## 2016-06-17 DIAGNOSIS — J9602 Acute respiratory failure with hypercapnia: Secondary | ICD-10-CM

## 2016-06-17 DIAGNOSIS — I119 Hypertensive heart disease without heart failure: Secondary | ICD-10-CM | POA: Diagnosis not present

## 2016-06-17 DIAGNOSIS — Z23 Encounter for immunization: Secondary | ICD-10-CM | POA: Diagnosis not present

## 2016-06-17 DIAGNOSIS — J9600 Acute respiratory failure, unspecified whether with hypoxia or hypercapnia: Secondary | ICD-10-CM | POA: Diagnosis not present

## 2016-06-17 DIAGNOSIS — R0602 Shortness of breath: Secondary | ICD-10-CM | POA: Diagnosis not present

## 2016-06-17 DIAGNOSIS — F17210 Nicotine dependence, cigarettes, uncomplicated: Secondary | ICD-10-CM | POA: Diagnosis not present

## 2016-06-17 HISTORY — DX: Unspecified osteoarthritis, unspecified site: M19.90

## 2016-06-17 HISTORY — DX: Chronic obstructive pulmonary disease, unspecified: J44.9

## 2016-06-17 HISTORY — DX: Essential (primary) hypertension: I10

## 2016-06-17 LAB — COMPREHENSIVE METABOLIC PANEL
ALBUMIN: 4.4 g/dL (ref 3.5–5.0)
ALK PHOS: 64 U/L (ref 38–126)
ALT: 27 U/L (ref 17–63)
ANION GAP: 5 (ref 5–15)
AST: 44 U/L — ABNORMAL HIGH (ref 15–41)
BILIRUBIN TOTAL: 0.8 mg/dL (ref 0.3–1.2)
BUN: 20 mg/dL (ref 6–20)
CALCIUM: 9.7 mg/dL (ref 8.9–10.3)
CO2: 34 mmol/L — ABNORMAL HIGH (ref 22–32)
Chloride: 96 mmol/L — ABNORMAL LOW (ref 101–111)
Creatinine, Ser: 0.75 mg/dL (ref 0.61–1.24)
GFR calc Af Amer: >60 mL/min (ref >60)
GFR calc non Af Amer: >60 mL/min (ref >60)
GLUCOSE: 118 mg/dL — AB (ref 65–99)
Potassium: 4.7 mmol/L (ref 3.5–5.1)
Sodium: 135 mmol/L (ref 135–145)
TOTAL PROTEIN: 8.4 g/dL — AB (ref 6.5–8.1)

## 2016-06-17 LAB — CBC
HCT: 47.8 % (ref 40.0–52.0)
Hemoglobin: 15.8 g/dL (ref 13.0–18.0)
MCH: 29.7 pg (ref 26.0–34.0)
MCHC: 33 g/dL (ref 32.0–36.0)
MCV: 90 fL (ref 80.0–100.0)
PLATELETS: 257 10*3/uL (ref 150–440)
RBC: 5.31 MIL/uL (ref 4.40–5.90)
RDW: 14.6 % — ABNORMAL HIGH (ref 11.5–14.5)
WBC: 21.9 10*3/uL — ABNORMAL HIGH (ref 3.8–10.6)

## 2016-06-17 LAB — TROPONIN I: Troponin I: <0.03 ng/mL (ref <0.03)

## 2016-06-17 LAB — BRAIN NATRIURETIC PEPTIDE: B Natriuretic Peptide: 105 pg/mL — ABNORMAL HIGH (ref 0.0–100.0)

## 2016-06-17 MED ORDER — ACETAMINOPHEN 325 MG PO TABS: 650.0000 mg | ORAL_TABLET | ORAL | Status: DC | PRN

## 2016-06-17 MED ORDER — IPRATROPIUM-ALBUTEROL 0.5-2.5 (3) MG/3ML IN SOLN: 3.0000 mL | RESPIRATORY_TRACT | Status: DC | PRN

## 2016-06-17 MED ORDER — MELOXICAM 7.5 MG PO TABS
15.0000 mg | ORAL_TABLET | Freq: Every day | ORAL | Status: DC
  Administered 2016-06-18: 15 mg via ORAL
  Filled 2016-06-17: qty 2

## 2016-06-17 MED ORDER — LORAZEPAM 0.5 MG PO TABS
0.5000 mg | ORAL_TABLET | Freq: Every day | ORAL | Status: DC
  Administered 2016-06-18 – 2016-06-22 (×5): 0.5 mg via ORAL
  Filled 2016-06-17 (×5): qty 1

## 2016-06-17 MED ORDER — MOMETASONE FURO-FORMOTEROL FUM 200-5 MCG/ACT IN AERO
2.0000 | INHALATION_SPRAY | Freq: Two times a day (BID) | RESPIRATORY_TRACT | Status: DC
  Filled 2016-06-17: qty 8.8

## 2016-06-17 MED ORDER — HEPARIN SODIUM (PORCINE) 5000 UNIT/ML IJ SOLN
5000.0000 [IU] | Freq: Three times a day (TID) | INTRAMUSCULAR | Status: DC
  Administered 2016-06-18: 5000 [IU] via SUBCUTANEOUS
  Filled 2016-06-17: qty 1

## 2016-06-17 MED ORDER — ORAL CARE MOUTH RINSE
15.0000 mL | Freq: Two times a day (BID) | OROMUCOSAL | Status: DC
  Administered 2016-06-18 – 2016-06-21 (×5): 15 mL via OROMUCOSAL

## 2016-06-17 MED ORDER — SODIUM CHLORIDE 0.9 % IV SOLN: 250.0000 mL | INTRAVENOUS | Status: DC | PRN

## 2016-06-17 MED ORDER — CHLORHEXIDINE GLUCONATE 0.12 % MT SOLN
15.0000 mL | Freq: Two times a day (BID) | OROMUCOSAL | Status: DC
  Administered 2016-06-18 – 2016-06-22 (×10): 15 mL via OROMUCOSAL
  Filled 2016-06-17 (×9): qty 15

## 2016-06-17 MED ORDER — ALBUTEROL SULFATE (2.5 MG/3ML) 0.083% IN NEBU: 5.0000 mg | INHALATION_SOLUTION | Freq: Once | RESPIRATORY_TRACT | Status: DC

## 2016-06-17 MED ORDER — IPRATROPIUM-ALBUTEROL 0.5-2.5 (3) MG/3ML IN SOLN
3.0000 mL | Freq: Once | RESPIRATORY_TRACT | Status: AC
  Administered 2016-06-17: 3 mL via RESPIRATORY_TRACT
  Filled 2016-06-17: qty 3

## 2016-06-17 MED ORDER — OXYCODONE HCL 5 MG PO TABS
5.0000 mg | ORAL_TABLET | Freq: Four times a day (QID) | ORAL | Status: DC
  Administered 2016-06-18 – 2016-06-19 (×5): 5 mg via ORAL
  Filled 2016-06-17 (×5): qty 1

## 2016-06-17 MED ORDER — ONDANSETRON HCL 4 MG/2ML IJ SOLN: 4.0000 mg | Freq: Four times a day (QID) | INTRAMUSCULAR | Status: DC | PRN

## 2016-06-17 MED ORDER — LEVOFLOXACIN IN D5W 750 MG/150ML IV SOLN
750.0000 mg | Freq: Once | INTRAVENOUS | Status: AC
  Administered 2016-06-17: 750 mg via INTRAVENOUS
  Filled 2016-06-17: qty 150

## 2016-06-17 MED ORDER — LEVOFLOXACIN 500 MG PO TABS: 750.0000 mg | ORAL_TABLET | Freq: Every day | ORAL | Status: DC

## 2016-06-17 MED ORDER — METHYLPREDNISOLONE SODIUM SUCC 125 MG IJ SOLR
125.0000 mg | Freq: Once | INTRAMUSCULAR | Status: AC
  Administered 2016-06-17: 125 mg via INTRAVENOUS
  Filled 2016-06-17: qty 2

## 2016-06-17 NOTE — H&P (Signed)
PULMONARY / CRITICAL CARE MEDICINE   Name: Scott Richard MRN: [id number] DOB: Aug 12, 1935    ADMISSION DATE:  06/17/2016 CONSULTATION DATE: 06/17/16  REFERRING MD:  Dr. Mayford Knife CHIEF COMPLAINT:  Shortness of breath  HISTORY OF PRESENT ILLNESS:   Scott Richard is a 81 yo male with PMH significant for COPD, Tobacco abuse and Hypertension. Patient had increased shortness of breath, cough and congestion for past 3 days.  Patient also reports that he fell from steps this evening around 05:30 pm.  His shortness of breath has worsened since he fell. CXR was concerning for left 5th rib fracture and atypical PNA.  Patient was noted to be hypoxic with sats down to 67%.Patient was placed on BiPAP and PCCM team called to admit the patient. PAST MEDICAL HISTORY :  He  has a past medical history of Arthritis; COPD (chronic obstructive pulmonary disease) (HCC); and Hypertension.  PAST SURGICAL HISTORY: He  has no past surgical history on file.  No Known Allergies  No current facility-administered medications on file prior to encounter.    No current outpatient prescriptions on file prior to encounter.    FAMILY HISTORY:  His has no family status information on file.    SOCIAL HISTORY: He  reports that he has been smoking.  He has never used smokeless tobacco. He reports that he does not drink alcohol.  REVIEW OF SYSTEMS:   Unable to obtain as the patient is on BiPAP  SUBJECTIVE:  Unable to obtain as the patient is on BiPAP  VITAL SIGNS: BP 114/90   Pulse (!) 109   Temp 98.1 F (36.7 C) (Oral)   Resp (!) 21   Ht 5\' 10"  (1.778 m)   Wt 68.9 kg (152 lb)   SpO2 95%   BMI 21.81 kg/m   HEMODYNAMICS:    VENTILATOR SETTINGS: FiO2 (%):  [40 %] 40 %  INTAKE / OUTPUT: No intake/output data recorded.  PHYSICAL EXAMINATION: General:  Elderly gentleman, on BiPAP, in no distress Neuro: Awake, alert, oriented HEENT:  AT,[place],no JVD,PERRLA Cardiovascular:  S1S2,regular, no m/r/g  noted Lungs:  Diminished bibasilar, no wheezes, crackles, rhonchi noted Abdomen:  Soft, NT,+BS Musculoskeletal:  Active ROM,  No edema/cyanosis noted Skin:  Warm , dry and intact  LABS:  BMET  Recent Labs Lab 06/17/16 2106  NA 135  K 4.7  CL 96*  CO2 34*  BUN 20  CREATININE 0.75  GLUCOSE 118*    Electrolytes  Recent Labs Lab 06/17/16 2106  CALCIUM 9.7    CBC  Recent Labs Lab 06/17/16 2106  WBC 21.9*  HGB 15.8  HCT 47.8  PLT 257    Coag's No results for input(s): APTT, INR in the last 168 hours.  Sepsis Markers No results for input(s): LATICACIDVEN, PROCALCITON, O2SATVEN in the last 168 hours.  ABG No results for input(s): PHART, PCO2ART, PO2ART in the last 168 hours.  Liver Enzymes  Recent Labs Lab 06/17/16 2106  AST 44*  ALT 27  ALKPHOS 64  BILITOT 0.8  ALBUMIN 4.4    Cardiac Enzymes  Recent Labs Lab 06/17/16 2106  TROPONINI <0.03    Glucose No results for input(s): GLUCAP in the last 168 hours.  Imaging Dg Chest 1 View  Result Date: 06/17/2016 CLINICAL DATA:  Increasing shortness of breath, coughing and congestion for 3 days. Fall. History of COPD. EXAM: CHEST 1 VIEW COMPARISON:  Chest radiograph December 25, 2011 FINDINGS: Cardiac silhouette is mildly enlarged and unchanged. Mediastinal silhouette is nonsuspicious, mildly  calcified aortic knob. Diffuse interstitial prominence with LEFT lung base strandy densities. Elevated RIGHT hemidiaphragm is unchanged with gas distended colonic intra positioning. No pneumothorax. Nondisplaced LEFT fifth rib fracture may be acute. Old RIGHT rib fractures. ACDF and facet screws. Osteopenia. IMPRESSION: LEFT fifth rib fracture may be acute.  Old RIGHT rib fractures. Stable cardiomegaly, increasing interstitial prominence concerning for atypical infection or pulmonary edema. Bibasilar atelectasis. Electronically Signed   By: Awilda Metroourtnay  Bloomer M.D.   On: 06/17/2016 21:48     STUDIES:   none  CULTURES: 3/7 [place]>>  ANTIBIOTICS: Levaquin>>  SIGNIFICANT EVENTS: 3/7 Patient admitted with shortness of breath related to atelectasis vs COPD.  LINES/TUBES: NONE   ASSESSMENT / PLAN:  PULMONARY A: Acute Hypoxic/Hypercarbic Respiratory failure CAP Current smoker Bibasilar Atelectasis P:   Continue BiPAP, wean as tolerated Levaquin X5 days Tobacco cessation counseling Bronchodilators PRN Incentive Spirometry  CARDIOVASCULAR A:  Hx of Hypertension P:  Continuous telemetry Normotensive currently  RENAL A:   No active issues P:   Replace electrolytes as needed Follow BMET intermittently   GASTROINTESTINAL A:   No active issues P:   Npo for now, will start a diet in morning  HEMATOLOGIC A:   No active issues P:  Heparin for DVT prophylaxis Transfuse per usual guidelines  INFECTIOUS A:   CAP Leukocytosis P:   Continue levaquin Follow cultures. CBC Monitor fever  ENDOCRINE A:   No active issues P:   Blood glucose intermittently with BMP  NEUROLOGIC A:   Pain related to left rib fracture P:    Continue Oxycodone   FAMILY  - Updates: Sister was updated regarding the plan of care.     Bincy Varughese,AG-ACNP Pulmonary and Critical Care Medicine [facility]  06/17/2016, 11:24 PM   PCCM ATTENDING ATTESTATION:  I have evaluated patient with the APP Varughese, reviewed database in its entirety and discussed care plan in detail. In addition, this patient was discussed on multidisciplinary rounds.    Pt of Dr Welton FlakesKhan, still smoking, longstanding COPD, admitted with COPD exac. Transiently on BiPAP. Now comfortable on [place] O2  Important exam findings: No distress on [place] O2 @ 3 lpm [place] HEENT: very poor dentition, otherwise normal No JVD Distant BS, scattered wheezes, no acc muscle use Reg, no M NABS No edema  Major problems addressed by PCCM team: Acute on chronic respiratory failure Smoker Apparently severe COPD @  baseline AECOPD - improving    PLAN/REC: Transfer to [facility] to assume his care beginning in AM 03/09  PCCM will sign off as of then I spoke with Dr Welton FlakesKhan who will see him in follow up after discharge or sooner PRN Decrease methylpred to 40 mg q 12 hrs Continue nebulized steroids and BDs - I have adjusted regimen Continue empiric levofloxacin On discharge, he should go home on his previous meds plus a complete a pred taper and course of abx    Billy Fischeravid Deunta Beneke, MD PCCM service Mobile [phone] Pager [phone]

## 2016-06-17 NOTE — Progress Notes (Signed)
Antibiotic renal dosing  Antibiotic orders reviewed and are currently dosed appropriately for renal function.  Pharmacy will follow and manage as indicated.

## 2016-06-17 NOTE — ED Triage Notes (Addendum)
Pt to triage in wheelchair due to SOB. Pt c/o increasing SOB, cough and congestion x3 days. Pt reports a mechanical fall while coming down steps in home, worsening SOB after fall. Pt has HX of COPD, wet lung sounds on assessment.  Pt denies N/V and chest pain. Pt has O2 and nebulizer treatments at home but denies use. Pts O2 is 67% on RA in triage.

## 2016-06-17 NOTE — ED Notes (Signed)
EDP states no blood cultures at this time

## 2016-06-17 NOTE — ED Notes (Signed)
Resp. Therapist in room to place pt on Bipap.

## 2016-06-17 NOTE — ED Notes (Signed)
Pt tolerating Bipap well at this time.

## 2016-06-17 NOTE — ED Provider Notes (Addendum)
[facility] Emergency Department Provider Note        Time seen: ----------------------------------------- 9:10 PM on 06/17/2016 -----------------------------------------    I have reviewed the triage vital signs and the nursing notes.   HISTORY  Chief Complaint Shortness of Breath    HPI Scott Richard is a 81 y.o. male who presents to the ER for shortness of breath. Patient has had increasing shortness breath and cough with congestion for 3 days. Patient also reports a mechanical fall coming down the steps at home. Shortness of breath has worsened since then he is complaining of left chest pain. He has history of COPD. Room air saturations were 67% in triage. He denies fevers or chills.   Past Medical History:  Diagnosis Date  . Arthritis   . COPD (chronic obstructive pulmonary disease) (HCC)   . Hypertension     There are no active problems to display for this patient.   History reviewed. No pertinent surgical history.  Allergies Patient has no known allergies.  Social History Social History  Substance Use Topics  . Smoking status: Current Every Day Smoker  . Smokeless tobacco: Never Used  . Alcohol use No    Review of Systems Constitutional: Negative for fever. Cardiovascular: Positive for chest pain Respiratory: Positive shortness of breath Gastrointestinal: Negative for abdominal pain, vomiting and diarrhea. Genitourinary: Negative for dysuria. Musculoskeletal: Positive for left-sided rib pain Skin: Negative for rash. Neurological: Negative for headaches, focal weakness or numbness.  10-point ROS otherwise negative.  ____________________________________________   PHYSICAL EXAM:  VITAL SIGNS: ED Triage Vitals  Enc Vitals Group     BP 06/17/16 2100 (!) 205/113     Pulse Rate 06/17/16 2100 76     Resp 06/17/16 2100 (!) 26     Temp 06/17/16 2100 98.1 F (36.7 C)     Temp Source 06/17/16 2100 Oral     SpO2 06/17/16  2100 (!) 67 %     Weight 06/17/16 2056 152 lb (68.9 kg)     Height 06/17/16 2056 5\' 10"  (1.778 m)     Head Circumference --      Peak Flow --      Pain Score --      Pain Loc --      Pain Edu? --      Excl. in GC? --     Constitutional: Alert and oriented. Moderate distress Eyes: Conjunctivae are normal. PERRL. Normal extraocular movements. ENT   Head: Normocephalic and atraumatic.   Nose: No congestion/rhinnorhea.   Mouth/Throat: Mucous membranes are moist.   Neck: No stridor. Cardiovascular: Normal rate, regular rhythm. No murmurs, rubs, or gallops. Respiratory: Tachypnea with wheezing and rhonchi bilaterally Gastrointestinal: Soft and nontender. Normal bowel sounds Musculoskeletal: Nontender with normal range of motion in all extremities. No lower extremity tenderness nor edema. Left chest wall and rib tenderness Neurologic:  Normal speech and language. No gross focal neurologic deficits are appreciated.  Skin:  Skin is warm, dry and intact. No rash noted. Psychiatric: Mood and affect are normal. Speech and behavior are normal.  ____________________________________________  EKG: Interpreted by me.Sinus rhythm with first-degree AV block, normal QRS, normal QT, left axis deviation, possible inferior infarct age indeterminate  ____________________________________________  ED COURSE:  Pertinent labs & imaging results that were available during my care of the patient were reviewed by me and considered in my medical decision making (see chart for details). Patient presents to the ER for shortness of breath and likely COPD exacerbation ,  located by fall today. We will assess with labs and imaging. He received DuoNeb since steroids.   Procedures ____________________________________________   LABS (pertinent positives/negatives)  Labs Reviewed  CBC - Abnormal; Notable for the following:       Result Value   WBC 21.9 (*)    RDW 14.6 (*)    All other components  within normal limits  BRAIN NATRIURETIC PEPTIDE - Abnormal; Notable for the following:    B Natriuretic Peptide 105.0 (*)    All other components within normal limits  COMPREHENSIVE METABOLIC PANEL - Abnormal; Notable for the following:    Chloride 96 (*)    CO2 34 (*)    Glucose, Bld 118 (*)    Total Protein 8.4 (*)    AST 44 (*)    All other components within normal limits  BLOOD GAS, VENOUS - Abnormal; Notable for the following:    pCO2, Ven 81 (*)    Bicarbonate 37.2 (*)    Acid-Base Excess 7.2 (*)    All other components within normal limits  TROPONIN I   CRITICAL CARE Performed by: Emily FilbertWilliams, Luan Maberry E   Total critical care time: 30 minutes  Critical care time was exclusive of separately billable procedures and treating other patients.  Critical care was necessary to treat or prevent imminent or life-threatening deterioration.  Critical care was time spent personally by me on the following activities: development of treatment plan with patient and/or surrogate as well as nursing, discussions with consultants, evaluation of patient's response to treatment, examination of patient, obtaining history from patient or surrogate, ordering and performing treatments and interventions, ordering and review of laboratory studies, ordering and review of radiographic studies, pulse oximetry and re-evaluation of patient's condition.  RADIOLOGY Images were viewed by me  Chest x-ray  IMPRESSION: LEFT fifth rib fracture may be acute. Old RIGHT rib fractures.  Stable cardiomegaly, increasing interstitial prominence concerning for atypical infection or pulmonary edema.  Bibasilar atelectasis. ____________________________________________  FINAL ASSESSMENT AND PLAN  COPD exacerbation, fall, hypoxia, possible pneumonia  Plan: Patient with labs and imaging as dictated above. Patient presented with worsening shortness of breath. He has findings concerning for acute respiratory failure  with hypoxia and hypercarbia. He is placed on BiPAP. I will order IV antibiotics to cover for community-acquired pneumonia. He will need to be admitted to the hospital for further evaluation.   Emily FilbertWilliams, Taiwana Willison E, MD   Note: This note was generated in part or whole with voice recognition software. Voice recognition is usually quite accurate but there are transcription errors that can and very often do occur. I apologize for any typographical errors that were not detected and corrected.     Emily FilbertJonathan E Dennis Killilea, MD 06/17/16 [id number]    Emily FilbertJonathan E Yulonda Wheeling, MD 06/17/16 2209

## 2016-06-18 LAB — BASIC METABOLIC PANEL
Anion gap: 9 (ref 5–15)
BUN: 24 mg/dL — ABNORMAL HIGH (ref 6–20)
CHLORIDE: 96 mmol/L — AB (ref 101–111)
CO2: 26 mmol/L (ref 22–32)
Calcium: 9.3 mg/dL (ref 8.9–10.3)
Creatinine, Ser: 1.05 mg/dL (ref 0.61–1.24)
GFR calc non Af Amer: >60 mL/min (ref >60)
Glucose, Bld: 160 mg/dL — ABNORMAL HIGH (ref 65–99)
POTASSIUM: 4.6 mmol/L (ref 3.5–5.1)
Sodium: 131 mmol/L — ABNORMAL LOW (ref 135–145)

## 2016-06-18 LAB — MAGNESIUM: Magnesium: 1.7 mg/dL (ref 1.7–2.4)

## 2016-06-18 LAB — CBC
HEMATOCRIT: 41.4 % (ref 40.0–52.0)
Hemoglobin: 13.6 g/dL (ref 13.0–18.0)
MCH: 30 pg (ref 26.0–34.0)
MCHC: 32.8 g/dL (ref 32.0–36.0)
MCV: 91.4 fL (ref 80.0–100.0)
Platelets: 202 10*3/uL (ref 150–440)
RBC: 4.53 MIL/uL (ref 4.40–5.90)
RDW: 15 % — AB (ref 11.5–14.5)
WBC: 13 10*3/uL — AB (ref 3.8–10.6)

## 2016-06-18 LAB — MRSA PCR SCREENING: MRSA BY PCR: NEGATIVE

## 2016-06-18 LAB — PHOSPHORUS: Phosphorus: 3 mg/dL (ref 2.5–4.6)

## 2016-06-18 LAB — GLUCOSE, CAPILLARY: GLUCOSE-CAPILLARY: 174 mg/dL — AB (ref 65–99)

## 2016-06-18 MED ORDER — ENOXAPARIN SODIUM 40 MG/0.4ML ~~LOC~~ SOLN
40.0000 mg | SUBCUTANEOUS | Status: DC
  Administered 2016-06-18 – 2016-06-22 (×5): 40 mg via SUBCUTANEOUS
  Filled 2016-06-18 (×5): qty 0.4

## 2016-06-18 MED ORDER — METHYLPREDNISOLONE SODIUM SUCC 40 MG IJ SOLR
40.0000 mg | Freq: Two times a day (BID) | INTRAMUSCULAR | Status: DC
  Administered 2016-06-18 – 2016-06-22 (×9): 40 mg via INTRAVENOUS
  Filled 2016-06-18 (×9): qty 1

## 2016-06-18 MED ORDER — BUDESONIDE 0.25 MG/2ML IN SUSP
0.2500 mg | Freq: Four times a day (QID) | RESPIRATORY_TRACT | Status: DC
  Administered 2016-06-18 – 2016-06-21 (×13): 0.25 mg via RESPIRATORY_TRACT
  Filled 2016-06-18 (×14): qty 2

## 2016-06-18 MED ORDER — PRAMIPEXOLE DIHYDROCHLORIDE 0.25 MG PO TABS
0.1250 mg | ORAL_TABLET | Freq: Two times a day (BID) | ORAL | Status: DC
  Administered 2016-06-18 – 2016-06-19 (×3): 0.125 mg via ORAL
  Filled 2016-06-18 (×4): qty 1

## 2016-06-18 MED ORDER — LEVOFLOXACIN 500 MG PO TABS
500.0000 mg | ORAL_TABLET | Freq: Every day | ORAL | Status: DC
  Administered 2016-06-18 – 2016-06-21 (×4): 500 mg via ORAL
  Filled 2016-06-18 (×4): qty 1

## 2016-06-18 MED ORDER — IPRATROPIUM-ALBUTEROL 0.5-2.5 (3) MG/3ML IN SOLN
3.0000 mL | Freq: Four times a day (QID) | RESPIRATORY_TRACT | Status: DC
Start: 2016-06-18 — End: 2016-06-21
  Administered 2016-06-18 – 2016-06-21 (×13): 3 mL via RESPIRATORY_TRACT
  Filled 2016-06-18 (×13): qty 3

## 2016-06-18 MED ORDER — VITAMIN D 1000 UNITS PO TABS
1000.0000 [IU] | ORAL_TABLET | Freq: Every day | ORAL | Status: DC
  Administered 2016-06-18 – 2016-06-22 (×5): 1000 [IU] via ORAL
  Filled 2016-06-18 (×5): qty 1

## 2016-06-18 MED ORDER — PNEUMOCOCCAL VAC POLYVALENT 25 MCG/0.5ML IJ INJ
0.5000 mL | INJECTION | INTRAMUSCULAR | Status: AC
  Administered 2016-06-19: 0.5 mL via INTRAMUSCULAR
  Filled 2016-06-18: qty 0.5

## 2016-06-18 MED ORDER — INFLUENZA VAC SPLIT QUAD 0.5 ML IM SUSY
0.5000 mL | PREFILLED_SYRINGE | INTRAMUSCULAR | Status: AC
  Administered 2016-06-19: 0.5 mL via INTRAMUSCULAR
  Filled 2016-06-18: qty 0.5

## 2016-06-18 MED ORDER — BUDESONIDE 0.25 MG/2ML IN SUSP
0.2500 mg | Freq: Four times a day (QID) | RESPIRATORY_TRACT | Status: DC
  Administered 2016-06-18: 0.25 mg via RESPIRATORY_TRACT
  Filled 2016-06-18: qty 2

## 2016-06-18 MED ORDER — IPRATROPIUM-ALBUTEROL 0.5-2.5 (3) MG/3ML IN SOLN
3.0000 mL | Freq: Four times a day (QID) | RESPIRATORY_TRACT | Status: DC
  Administered 2016-06-18: 3 mL via RESPIRATORY_TRACT
  Filled 2016-06-18: qty 3

## 2016-06-18 MED ORDER — ADULT MULTIVITAMIN W/MINERALS CH
1.0000 | ORAL_TABLET | Freq: Every day | ORAL | Status: DC
  Administered 2016-06-18 – 2016-06-22 (×5): 1 via ORAL
  Filled 2016-06-18 (×5): qty 1

## 2016-06-18 NOTE — Progress Notes (Signed)
Patient being transferred out of ICU. Signout received from Dr. Sung AmabileSimonds. Admitted with COPD exacerbation. Patient was on BiPAP. Now off BiPAP and on nasal cannula oxygen. Hospitalist will ground from 06/19/2016.

## 2016-06-18 NOTE — Progress Notes (Signed)
Pt taken off bipap, placed on 6lpm [place], sats 96%, respiratory rate 20/min, no respiratory distress noted, will continue to monitor

## 2016-06-19 LAB — BLOOD GAS, VENOUS
ACID-BASE EXCESS: 7.2 mmol/L — AB (ref 0.0–2.0)
Bicarbonate: 37.2 mmol/L — ABNORMAL HIGH (ref 20.0–28.0)
FIO2: 1
PATIENT TEMPERATURE: 37
pCO2, Ven: 81 mmHg (ref 44.0–60.0)
pH, Ven: 7.27 (ref 7.250–7.430)

## 2016-06-19 MED ORDER — PROPRANOLOL HCL 10 MG PO TABS
10.0000 mg | ORAL_TABLET | Freq: Three times a day (TID) | ORAL | Status: DC
  Administered 2016-06-19 – 2016-06-22 (×9): 10 mg via ORAL
  Filled 2016-06-19 (×9): qty 1

## 2016-06-19 MED ORDER — GUAIFENESIN ER 600 MG PO TB12
600.0000 mg | ORAL_TABLET | Freq: Two times a day (BID) | ORAL | Status: DC
  Administered 2016-06-19 – 2016-06-22 (×7): 600 mg via ORAL
  Filled 2016-06-19 (×7): qty 1

## 2016-06-19 MED ORDER — OXYCODONE HCL 5 MG PO TABS
5.0000 mg | ORAL_TABLET | Freq: Four times a day (QID) | ORAL | Status: DC | PRN
  Administered 2016-06-22: 5 mg via ORAL
  Filled 2016-06-19: qty 1

## 2016-06-19 MED ORDER — PRAMIPEXOLE DIHYDROCHLORIDE 0.25 MG PO TABS
0.2500 mg | ORAL_TABLET | Freq: Two times a day (BID) | ORAL | Status: DC
  Administered 2016-06-19 – 2016-06-22 (×6): 0.25 mg via ORAL
  Filled 2016-06-19 (×6): qty 1

## 2016-06-19 MED ORDER — TRAMADOL HCL 50 MG PO TABS
50.0000 mg | ORAL_TABLET | Freq: Two times a day (BID) | ORAL | Status: DC
  Administered 2016-06-19 – 2016-06-22 (×7): 50 mg via ORAL
  Filled 2016-06-19 (×7): qty 1

## 2016-06-19 NOTE — Progress Notes (Signed)
[employer] - [place] at [facility]   PATIENT NAME: Scott CoveWilbur Richard    MR#:  [id number]  DATE OF BIRTH:  02/15/1936  SUBJECTIVE:  CHIEF COMPLAINT:   Chief Complaint  Patient presents with  . Shortness of Breath   - has tremors at rest - coughing, breathing is improving   REVIEW OF SYSTEMS:  Review of Systems  Constitutional: Positive for malaise/fatigue. Negative for chills and fever.  HENT: Negative for ear discharge, hearing loss and nosebleeds.   Eyes: Negative for blurred vision and double vision.  Respiratory: Positive for cough and shortness of breath. Negative for wheezing.   Cardiovascular: Negative for chest pain and palpitations.  Gastrointestinal: Negative for abdominal pain, constipation, diarrhea, nausea and vomiting.  Genitourinary: Negative for dysuria.  Musculoskeletal: Negative for myalgias.  Neurological: Positive for tremors. Negative for dizziness, speech change, focal weakness, seizures and headaches.  Psychiatric/Behavioral: Negative for depression.    DRUG ALLERGIES:  No Known Allergies  VITALS:  Blood pressure 125/62, pulse 72, temperature 97.5 F (36.4 C), temperature source Oral, resp. rate 20, height 5\' 6"  (1.676 m), weight 60.8 kg (134 lb), SpO2 97 %.  PHYSICAL EXAMINATION:  Physical Exam  GENERAL:  81 y.o.-year-old patient lying in the bed with no acute distress.  EYES: Pupils equal, round, reactive to light and accommodation. No scleral icterus. Extraocular muscles intact.  HEENT: Head atraumatic, normocephalic. Oropharynx and nasopharynx clear.  NECK:  Supple, no jugular venous distention. No thyroid enlargement, no tenderness.  LUNGS: coarse breath sounds bilaterally, no wheezing, rales or crepitation. No use of accessory muscles of respiration.  CARDIOVASCULAR: S1, S2 normal. No rubs, or gallops. 3/6 systolic murmur present ABDOMEN: Soft, nontender, nondistended. Bowel sounds present. No organomegaly or mass.    EXTREMITIES: No pedal edema, cyanosis, or clubbing.  NEUROLOGIC: Cranial nerves II through XII are intact. Muscle strength 5/5 in all extremities. Sensation intact. Gait not checked. Global weakness present Significant tremors of right hand and left leg jerks PSYCHIATRIC: The patient is alert and oriented x 2  SKIN: No obvious rash, lesion, or ulcer.    LABORATORY PANEL:   CBC  Recent Labs Lab 06/18/16 0429  WBC 13.0*  HGB 13.6  HCT 41.4  PLT 202   ------------------------------------------------------------------------------------------------------------------  Chemistries   Recent Labs Lab 06/17/16 2106 06/18/16 0429  NA 135 131*  K 4.7 4.6  CL 96* 96*  CO2 34* 26  GLUCOSE 118* 160*  BUN 20 24*  CREATININE 0.75 1.05  CALCIUM 9.7 9.3  MG  --  1.7  AST 44*  --   ALT 27  --   ALKPHOS 64  --   BILITOT 0.8  --    ------------------------------------------------------------------------------------------------------------------  Cardiac Enzymes  Recent Labs Lab 06/17/16 2106  TROPONINI <0.03   ------------------------------------------------------------------------------------------------------------------  RADIOLOGY:  Dg Chest 1 View  Result Date: 06/17/2016 CLINICAL DATA:  Increasing shortness of breath, coughing and congestion for 3 days. Fall. History of COPD. EXAM: CHEST 1 VIEW COMPARISON:  Chest radiograph December 25, 2011 FINDINGS: Cardiac silhouette is mildly enlarged and unchanged. Mediastinal silhouette is nonsuspicious, mildly calcified aortic knob. Diffuse interstitial prominence with LEFT lung base strandy densities. Elevated RIGHT hemidiaphragm is unchanged with gas distended colonic intra positioning. No pneumothorax. Nondisplaced LEFT fifth rib fracture may be acute. Old RIGHT rib fractures. ACDF and facet screws. Osteopenia. IMPRESSION: LEFT fifth rib fracture may be acute.  Old RIGHT rib fractures. Stable cardiomegaly, increasing interstitial  prominence concerning for atypical infection or pulmonary edema. Bibasilar atelectasis.  Electronically Signed   By: Awilda Metro M.D.   On: 06/17/2016 21:48    EKG:   Orders placed or performed during the hospital encounter of 06/17/16  . ED EKG  . ED EKG  . EKG 12-Lead  . EKG 12-Lead    ASSESSMENT AND PLAN:   81 year old male with past medical history significant for COPD on as needed home oxygen, arthritis, hypertension and ongoing smoking presents to the hospital secondary to difficulty breathing.  #1 acute on chronic respiratory failure-secondary to COPD exacerbation and community-acquired pneumonia. -Required BiPAP on admission. Transfer out of ICU. -Currently on 3 L oxygen. Still has congested cough. -Add flutter valve. Continue Levaquin. -Continue steroids and nebulizers for today. -Physical therapy consult for ambulation.  #2 benign essential tremors-add propranolol and monitor  #3 restless leg syndrome-continue Mirapex, increased dose today.  #4 DVT prophylaxis-on Lovenox     All the records are reviewed and case discussed with Care Management/Social Workerr. Management plans discussed with the patient, family and they are in agreement.  CODE STATUS: Full Code  TOTAL TIME TAKING CARE OF THIS PATIENT: 38 minutes.   POSSIBLE D/C IN 1-2 DAYS, DEPENDING ON CLINICAL CONDITION.   Enid Baas M.D on 06/19/2016 at 2:06 PM  Between 7am to 6pm - Pager - [phone]  After 6pm go to [email or web address] - Social research officer, government  [facility] [place] [facility]  Office  [phone]  CC: Primary care physician; [facility]

## 2016-06-19 NOTE — Care Management Important Message (Signed)
Important Message  Patient Details  Name: Irven EasterlyWilbur G Corne MRN: [id number] Date of Birth: 07/28/1935   Medicare Important Message Given:  Yes    Chapman FitchBOWEN, Mallori Araque T, RN 06/19/2016, 3:04 PM

## 2016-06-19 NOTE — Evaluation (Signed)
Physical Therapy Evaluation Patient Details Name: Scott Richard MRN: [id number] DOB: 06-21-1935 Today's Date: 06/19/2016   History of Present Illness  Pt is a 81 y.o. male presenting to hospital with increasing SOB and cough with congestion x3 days; pt also with mechanical fall down steps at home; O2 67% in triage at ED.  Pt admitted with acute hypoxic/hypercarbic respiratory failure; COPD exacerbation.  Imaging showing acute L 5th rib fx and old R rib fx's.  PMH includes COPD, htn, (+) smoker.  Pt verbally reports breaking his neck (and s/p surgery) about 6 years ago with UE and LE tremors since.  Clinical Impression  Prior to hospital admission, pt was modified independent (used quad cane in home and power w/c in community).  Pt lives with his sister in mobile home with ramp entrance.  Currently pt is SBA supine to sit and min assist with transfers and ambulation 10 feet with RW.  Pt overall very tremulous with movement/activity; pt falling backwards with multiple attempts at standing with RW requiring assist.  Pt's O2 sats 91% or greater on 3 L O2 via nasal cannula with activity during session.  Pt not very concise/clear with answers to questions during session.  Pt would benefit from skilled PT to address noted impairments and functional limitations.  Recommend pt discharge to STR when medically appropriate.    Follow Up Recommendations SNF    Equipment Recommendations  Rolling walker with 5" wheels (pt already owns RW)    Recommendations for Other Services       Precautions / Restrictions Precautions Precautions: Fall Precaution Comments: Keep O2 92-97% Restrictions Weight Bearing Restrictions: No      Mobility  Bed Mobility Overal bed mobility: Needs Assistance Bed Mobility: Supine to Sit     Supine to sit: Supervision;HOB elevated     General bed mobility comments: increased effort to perform on own; heavy use of bed rail  Transfers Overall transfer level: Needs  assistance Equipment used: Rolling walker (2 wheeled) Transfers: Sit to/from [employer] Sit to Stand: Min assist Stand pivot transfers: Min assist (bed to chair)       General transfer comment: pt requiring multiple attempts to stand (pt falling backwards onto bed 3 trials requiring assist and cueing to shift weight forward)  Ambulation/Gait Ambulation/Gait assistance: Min assist Ambulation Distance (Feet): 10 Feet Assistive device: Rolling walker (2 wheeled)   Gait velocity: decreased   General Gait Details: decrease B step length/foot clearance/heelstrike; tremulous in general UE's and LE's  Stairs            Wheelchair Mobility    Modified Rankin (Stroke Patients Only)       Balance Overall balance assessment: Needs assistance;History of Falls Sitting-balance support: Bilateral upper extremity supported;Feet supported Sitting balance-Leahy Scale: Fair Sitting balance - Comments: static sitting   Standing balance support: Bilateral upper extremity supported (on RW) Standing balance-Leahy Scale: Fair Standing balance comment: static standing                             Pertinent Vitals/Pain Pain Assessment: 0-10 Pain Score: 2  Pain Location: L ribs Pain Descriptors / Indicators: Sore Pain Intervention(s): Limited activity within patient's tolerance;Monitored during session;Repositioned  HR WFL during session.    Home Living Family/patient expects to be discharged to:: Private residence Living Arrangements: Other relatives (Pt's sister) Available Help at Discharge: Family Type of Home: Mobile home Home Access: Ramped entrance (or 3 STE with  L railing)     Home Layout: One level Home Equipment: Walker - 2 wheels;Cane - quad;Wheelchair - power      Prior Function Level of Independence: Independent with assistive device(s)         Comments: Pt utilizes quad cane in home and motorized wheelchair outside/in community.  Pt  reports recent fall is only fall in past 6 months.     Hand Dominance        Extremity/Trunk Assessment   Upper Extremity Assessment Upper Extremity Assessment: Generalized weakness    Lower Extremity Assessment Lower Extremity Assessment: Generalized weakness (pt appears to have intention tremors/movement L>R LE complicating ability to perform MMT)       Communication   Communication: No difficulties  Cognition Arousal/Alertness: Awake/alert Behavior During Therapy: WFL for tasks assessed/performed Overall Cognitive Status:  (Oriented to person and place.)                      General Comments   Nursing cleared pt for participation in physical therapy.  Pt agreeable to PT session.    Exercises Total Joint Exercises Ankle Circles/Pumps: AROM;Strengthening;Both;10 reps;Supine Heel Slides: AROM;Strengthening;Both;10 reps;Supine  Vc's required for above exercise technique.   Assessment/Plan    PT Assessment Patient needs continued PT services  PT Problem List Decreased strength;Decreased balance;Decreased mobility;Decreased knowledge of use of DME       PT Treatment Interventions DME instruction;Gait training;Therapeutic activities;Therapeutic exercise;Balance training;Functional mobility training;Neuromuscular re-education;Patient/family education    PT Goals (Current goals can be found in the Care Plan section)  Acute Rehab PT Goals Patient Stated Goal: to get stronger PT Goal Formulation: With patient Time For Goal Achievement: 07/03/16 Potential to Achieve Goals: Good    Frequency Min 2X/week   Barriers to discharge Decreased caregiver support      Co-evaluation               End of Session Equipment Utilized During Treatment: Gait belt;Oxygen (3 L O2 via nasal cannula) Activity Tolerance: Patient tolerated treatment well Patient left: in chair;with call bell/phone within reach;with chair alarm set Nurse Communication: Mobility  status;Precautions PT Visit Diagnosis: Unsteadiness on feet (R26.81);Other abnormalities of gait and mobility (R26.89);History of falling (Z91.81);Muscle weakness (generalized) (M62.81)         Time: 1423-1450 PT Time Calculation (min) (ACUTE ONLY): 27 min   Charges:   PT Evaluation $PT Eval Low Complexity: 1 Procedure PT Treatments $Therapeutic Activity: 8-22 mins   PT G CodesHendricks Richard:         Scott Richard, PT 06/19/16, 3:21 PM [phone]

## 2016-06-20 NOTE — Clinical Social Work Note (Signed)
CSW visited patient at bedside to discuss dc planning. The patient uses O2 chronically at 2L. The patient is declining SNF and is A&O x4. RNCM is aware. CSW signing off; however, please consult should any other needs arise or if patient changes mind.

## 2016-06-20 NOTE — Progress Notes (Signed)
[employer] - [place] at [facility]   PATIENT NAME: Scott Richard    MR#:  [id number]  DATE OF BIRTH:  [birth year]  SUBJECTIVE:  CHIEF COMPLAINT:   Chief Complaint  Patient presents with  . Shortness of Breath   - feels better, needs rehab - breathing improving, on o2  REVIEW OF SYSTEMS:  Review of Systems  Constitutional: Positive for malaise/fatigue. Negative for chills and fever.  HENT: Negative for ear discharge, hearing loss and nosebleeds.   Eyes: Negative for blurred vision and double vision.  Respiratory: Positive for cough and shortness of breath. Negative for wheezing.   Cardiovascular: Negative for chest pain and palpitations.  Gastrointestinal: Negative for abdominal pain, constipation, diarrhea, nausea and vomiting.  Genitourinary: Negative for dysuria.  Musculoskeletal: Negative for myalgias.  Neurological: Positive for tremors. Negative for dizziness, speech change, focal weakness, seizures and headaches.  Psychiatric/Behavioral: Negative for depression.    DRUG ALLERGIES:  No Known Allergies  VITALS:  Blood pressure 140/61, pulse 63, temperature 97.6 F (36.4 C), temperature source Oral, resp. rate 16, height 5\' 6"  (1.676 m), weight 55.8 kg (123 lb), SpO2 97 %.  PHYSICAL EXAMINATION:  Physical Exam  GENERAL:  81 y.o.-year-old patient lying in the bed with no acute distress.  EYES: Pupils equal, round, reactive to light and accommodation. No scleral icterus. Extraocular muscles intact.  HEENT: Head atraumatic, normocephalic. Oropharynx and nasopharynx clear.  NECK:  Supple, no jugular venous distention. No thyroid enlargement, no tenderness.  LUNGS: coarse breath sounds bilaterally, no wheezing, rales or crepitation. No use of accessory muscles of respiration.  CARDIOVASCULAR: S1, S2 normal. No rubs, or gallops. 3/6 systolic murmur present ABDOMEN: Soft, nontender, nondistended. Bowel sounds present. No organomegaly or mass.    EXTREMITIES: No pedal edema, cyanosis, or clubbing.  Significant scrotal swelling is present NEUROLOGIC: Cranial nerves II through XII are intact. Muscle strength 5/5 in all extremities. Sensation intact. Gait not checked. Global weakness present tremors are better but has left leg jerks PSYCHIATRIC: The patient is alert and oriented x 3 SKIN: No obvious rash, lesion, or ulcer.    LABORATORY PANEL:   CBC  Recent Labs Lab 06/18/16 0429  WBC 13.0*  HGB 13.6  HCT 41.4  PLT 202   ------------------------------------------------------------------------------------------------------------------  Chemistries   Recent Labs Lab 06/17/16 2106 06/18/16 0429  NA 135 131*  K 4.7 4.6  CL 96* 96*  CO2 34* 26  GLUCOSE 118* 160*  BUN 20 24*  CREATININE 0.75 1.05  CALCIUM 9.7 9.3  MG  --  1.7  AST 44*  --   ALT 27  --   ALKPHOS 64  --   BILITOT 0.8  --    ------------------------------------------------------------------------------------------------------------------  Cardiac Enzymes  Recent Labs Lab 06/17/16 2106  TROPONINI <0.03   ------------------------------------------------------------------------------------------------------------------  RADIOLOGY:  No results found.  EKG:   Orders placed or performed during the hospital encounter of 06/17/16  . ED EKG  . ED EKG  . EKG 12-Lead  . EKG 12-Lead    ASSESSMENT AND PLAN:   81 year old male with past medical history significant for COPD on as needed home oxygen, arthritis, hypertension and ongoing smoking presents to the hospital secondary to difficulty breathing.  #1 acute on chronic respiratory failure-secondary to COPD exacerbation and community-acquired pneumonia. -Required BiPAP on admission.  -Currently on 3 L oxygen. Still has congested cough. -added flutter valve. Continue Levaquin. -Continue steroids and nebulizers. -Physical therapy consulted and recommended rehabilitation  #2 benign essential  tremors-added propranolol  with some improvement and monitor  #3 restless leg syndrome-continue Mirapex  #4 DVT prophylaxis-on Lovenox   Social worker consulted. Will call sister and update today.   All the records are reviewed and case discussed with Care Management/Social Workerr. Management plans discussed with the patient, family and they are in agreement.  CODE STATUS: Full Code  TOTAL TIME TAKING CARE OF THIS PATIENT: 36 minutes.   POSSIBLE D/C on MONDAY, DEPENDING ON CLINICAL CONDITION.   Quinton Voth M.D on 06/20/2016 at 12:03 PM  Between 7am to 6pm - Pager - [phone]  After 6pm go to [email or web address] - Social research officer, governmentpassword EPAS [facility]  [facility] [place] [facility]  Office  [phone]  CC: Primary care physician; [facility]

## 2016-06-21 LAB — BASIC METABOLIC PANEL
Anion gap: 4 — ABNORMAL LOW (ref 5–15)
BUN: 25 mg/dL — AB (ref 6–20)
CALCIUM: 9 mg/dL (ref 8.9–10.3)
CHLORIDE: 91 mmol/L — AB (ref 101–111)
CO2: 37 mmol/L — AB (ref 22–32)
CREATININE: 0.6 mg/dL — AB (ref 0.61–1.24)
GFR calc non Af Amer: >60 mL/min (ref >60)
GLUCOSE: 116 mg/dL — AB (ref 65–99)
Potassium: 4.6 mmol/L (ref 3.5–5.1)
Sodium: 132 mmol/L — ABNORMAL LOW (ref 135–145)

## 2016-06-21 MED ORDER — BUDESONIDE 0.25 MG/2ML IN SUSP
0.2500 mg | Freq: Two times a day (BID) | RESPIRATORY_TRACT | Status: DC
  Administered 2016-06-22: 0.25 mg via RESPIRATORY_TRACT
  Filled 2016-06-21: qty 2

## 2016-06-21 MED ORDER — IPRATROPIUM-ALBUTEROL 0.5-2.5 (3) MG/3ML IN SOLN
3.0000 mL | Freq: Three times a day (TID) | RESPIRATORY_TRACT | Status: DC
  Administered 2016-06-22: 3 mL via RESPIRATORY_TRACT
  Filled 2016-06-21: qty 3

## 2016-06-21 NOTE — Progress Notes (Signed)
[facility] - [place] at [facility]   PATIENT NAME: Scott Richard    MR#:  [id number]  DATE OF BIRTH:  1935/05/12  SUBJECTIVE:  CHIEF COMPLAINT:   Chief Complaint  Patient presents with  . Shortness of Breath   - Improving, breathing is better - using flutter valve, refused rehab  REVIEW OF SYSTEMS:  Review of Systems  Constitutional: Negative for chills, fever and malaise/fatigue.  HENT: Negative for ear discharge, hearing loss and nosebleeds.   Eyes: Negative for blurred vision and double vision.  Respiratory: Positive for cough and shortness of breath. Negative for wheezing.   Cardiovascular: Negative for chest pain and palpitations.  Gastrointestinal: Negative for abdominal pain, constipation, diarrhea, nausea and vomiting.  Genitourinary: Negative for dysuria.  Musculoskeletal: Positive for falls. Negative for myalgias.  Neurological: Positive for tremors. Negative for dizziness, speech change, focal weakness, seizures and headaches.  Psychiatric/Behavioral: Negative for depression.    DRUG ALLERGIES:  No Known Allergies  VITALS:  Blood pressure (!) 142/72, pulse 69, temperature 97.9 F (36.6 C), temperature source Oral, resp. rate 20, height 5\' 6"  (1.676 m), weight 55.3 kg (122 lb), SpO2 95 %.  PHYSICAL EXAMINATION:  Physical Exam  GENERAL:  81 y.o.-year-old patient lying in the bed with no acute distress.  EYES: Pupils equal, round, reactive to light and accommodation. No scleral icterus. Extraocular muscles intact.  HEENT: Head atraumatic, normocephalic. Oropharynx and nasopharynx clear.  NECK:  Supple, no jugular venous distention. No thyroid enlargement, no tenderness.  LUNGS: coarse breath sounds bilaterally, no wheezing, rales or crepitation. No use of accessory muscles of respiration.  CARDIOVASCULAR: S1, S2 normal. No rubs, or gallops. 3/6 systolic murmur present ABDOMEN: Soft, nontender, nondistended. Bowel sounds present. No  organomegaly or mass.  EXTREMITIES: No pedal edema, cyanosis, or clubbing.  Significant scrotal swelling is present NEUROLOGIC: Cranial nerves II through XII are intact. Muscle strength 5/5 in all extremities. Sensation intact. Gait not checked. Global weakness present tremors are better but has left leg jerks PSYCHIATRIC: The patient is alert and oriented x 3 SKIN: No obvious rash, lesion, or ulcer.    LABORATORY PANEL:   CBC  Recent Labs Lab 06/18/16 0429  WBC 13.0*  HGB 13.6  HCT 41.4  PLT 202   ------------------------------------------------------------------------------------------------------------------  Chemistries   Recent Labs Lab 06/17/16 2106 06/18/16 0429 06/21/16 0323  NA 135 131* 132*  K 4.7 4.6 4.6  CL 96* 96* 91*  CO2 34* 26 37*  GLUCOSE 118* 160* 116*  BUN 20 24* 25*  CREATININE 0.75 1.05 0.60*  CALCIUM 9.7 9.3 9.0  MG  --  1.7  --   AST 44*  --   --   ALT 27  --   --   ALKPHOS 64  --   --   BILITOT 0.8  --   --    ------------------------------------------------------------------------------------------------------------------  Cardiac Enzymes  Recent Labs Lab 06/17/16 2106  TROPONINI <0.03   ------------------------------------------------------------------------------------------------------------------  RADIOLOGY:  No results found.  EKG:   Orders placed or performed during the hospital encounter of 06/17/16  . ED EKG  . ED EKG  . EKG 12-Lead  . EKG 12-Lead    ASSESSMENT AND PLAN:   81 year old male with past medical history significant for COPD on as needed home oxygen, arthritis, hypertension and ongoing smoking presents to the hospital secondary to difficulty breathing.  #1 acute on chronic respiratory failure-secondary to COPD exacerbation and community-acquired pneumonia. -Required BiPAP on admission.  -Currently on 3 L  oxygen. Still has congested cough. -added flutter valve. Continue Levaquin. -Continue steroids  and nebulizers. -Physical therapy consulted and recommended rehabilitation  #2 benign essential tremors-added propranolol with some improvement and monitor  #3 restless leg syndrome-continue Mirapex  #4 DVT prophylaxis-on Lovenox   PT recommended rehab, patient refused Will set up home health at discharge   All the records are reviewed and case discussed with Care Management/Social Workerr. Management plans discussed with the patient, family and they are in agreement.  CODE STATUS: Full Code  TOTAL TIME TAKING CARE OF THIS PATIENT: 36 minutes.   POSSIBLE D/C on MONDAY, DEPENDING ON CLINICAL CONDITION.   Enid BaasKALISETTI,Mayrani Khamis M.D on 06/21/2016 at 11:57 AM  Between 7am to 6pm - Pager - [phone]  After 6pm go to [email or web address] - Social research officer, governmentpassword EPAS [facility]  [facility] [place] [facility]  Office  [phone]  CC: Primary care physician; [facility]

## 2016-06-22 DIAGNOSIS — Z23 Encounter for immunization: Secondary | ICD-10-CM | POA: Diagnosis not present

## 2016-06-22 MED ORDER — LEVOFLOXACIN 500 MG PO TABS: 500.0000 mg | ORAL_TABLET | Freq: Every day | ORAL | 0 refills | Status: DC

## 2016-06-22 MED ORDER — PREDNISONE 10 MG (21) PO TBPK: ORAL_TABLET | ORAL | 0 refills | Status: DC

## 2016-06-22 MED ORDER — PROPRANOLOL HCL 10 MG PO TABS: 10.0000 mg | ORAL_TABLET | Freq: Three times a day (TID) | ORAL | 2 refills | Status: DC

## 2016-06-22 MED ORDER — OXYCODONE HCL 5 MG PO TABS: 5.0000 mg | ORAL_TABLET | Freq: Four times a day (QID) | ORAL | 0 refills | Status: DC | PRN

## 2016-06-22 MED ORDER — PRAMIPEXOLE DIHYDROCHLORIDE 0.5 MG PO TABS: 0.5000 mg | ORAL_TABLET | Freq: Two times a day (BID) | ORAL | 2 refills | Status: DC

## 2016-06-22 MED ORDER — IPRATROPIUM-ALBUTEROL 0.5-2.5 (3) MG/3ML IN SOLN: 3.0000 mL | Freq: Four times a day (QID) | RESPIRATORY_TRACT | 0 refills | Status: DC | PRN

## 2016-06-22 MED ORDER — GUAIFENESIN ER 600 MG PO TB12: 600.0000 mg | ORAL_TABLET | Freq: Two times a day (BID) | ORAL | 0 refills | Status: DC

## 2016-06-22 NOTE — Discharge Summary (Signed)
[facility] - [place] at [facility]   PATIENT NAME: Scott Richard    MR#:  [id number]  DATE OF BIRTH:  [birth year]  DATE OF ADMISSION:  06/17/2016   ADMITTING PHYSICIAN: Merwyn Katos, MD  DATE OF DISCHARGE: 06/22/16  PRIMARY CARE PHYSICIAN: [facility]   ADMISSION DIAGNOSIS:   COPD exacerbation (HCC) [J44.1] Acute respiratory failure with hypoxia and hypercapnia (HCC) [J96.01, J96.02] Closed fracture of one rib of left side, initial encounter [S22.32XA]  DISCHARGE DIAGNOSIS:   Active Problems:   COPD exacerbation (HCC)   SECONDARY DIAGNOSIS:   Past Medical History:  Diagnosis Date  . Arthritis   . COPD (chronic obstructive pulmonary disease) (HCC)   . Hypertension     HOSPITAL COURSE:   81 year old male with past medical history significant for COPD on as needed home oxygen, arthritis, hypertension and ongoing smoking presents to the hospital secondary to difficulty breathing.  #1 Acute on chronic respiratory failure-secondary to COPD exacerbation and community-acquired pneumonia. -Required BiPAP on admission.  -Currently on 2 L oxygen. Not on home o2- wean as tolerated or qualify for home o2- strongly counseled against smoking. -added flutter valve. Continue Levaquin to finish off course -Continue steroid taper and nebulizers. -Physical therapy consulted and recommended rehabilitation- but patient wants to go home  #2 benign essential tremors-added propranolol with some improvement and monitor  #3 restless leg syndrome-continue Mirapex- dose increased  #4 Arthritis- continue prn pain meds   PT recommended rehab, patient refused Set up home health at discharge   DISCHARGE CONDITIONS:   Guarded  CONSULTS OBTAINED:   Pulmonary critical care   DRUG ALLERGIES:   No Known Allergies DISCHARGE MEDICATIONS:   Allergies as of 06/22/2016   No Known Allergies     Medication List    TAKE these  medications   fluticasone 50 MCG/ACT nasal spray Commonly known as:  FLONASE Place 2 sprays into both nostrils daily.   guaiFENesin 600 MG 12 hr tablet Commonly known as:  MUCINEX Take 1 tablet (600 mg total) by mouth 2 (two) times daily.   ipratropium-albuterol 0.5-2.5 (3) MG/3ML Soln Commonly known as:  DUONEB Take 3 mLs by nebulization every 6 (six) hours as needed (wheezing).   levofloxacin 500 MG tablet Commonly known as:  LEVAQUIN Take 1 tablet (500 mg total) by mouth daily. X 3 more days   LORazepam 0.5 MG tablet Commonly known as:  ATIVAN Take 1 tablet by mouth daily.   meloxicam 15 MG tablet Commonly known as:  MOBIC Take 1 tablet by mouth daily.   multivitamin with minerals Tabs tablet Take 1 tablet by mouth daily.   oxyCODONE 5 MG immediate release tablet Commonly known as:  Oxy IR/ROXICODONE Take 1 tablet (5 mg total) by mouth every 6 (six) hours as needed for moderate pain. What changed:  when to take this  reasons to take this   pramipexole 0.5 MG tablet Commonly known as:  MIRAPEX Take 1 tablet (0.5 mg total) by mouth 2 (two) times daily. What changed:  medication strength  how much to take   predniSONE 10 MG (21) Tbpk tablet Commonly known as:  STERAPRED UNI-PAK 21 TAB 6 tabs PO x 1 day 5 tabs PO x 1 day 4 tabs PO x 1 day 3 tabs PO x 1 day 2 tabs PO x 1 day 1 tab PO x 1 day and stop   propranolol 10 MG tablet Commonly known as:  INDERAL Take 1 tablet (10 mg total)  by mouth 3 (three) times daily.   SYMBICORT 160-4.5 MCG/ACT inhaler Generic drug:  budesonide-formoterol Inhale 2 puffs into the lungs daily.   VITAMIN D-1000 MAX ST 1000 units tablet Generic drug:  Cholecalciferol Take 1 tablet by mouth daily.            Durable Medical Equipment        Start     Ordered   06/22/16 [phone]  For home use only DME oxygen  Once    Question Answer Comment  Mode or (Route) Nasal cannula   Liters per Minute 2   Frequency Continuous  (stationary and portable oxygen unit needed)   Oxygen conserving device Yes   Oxygen delivery system Gas      06/22/16 0815       DISCHARGE INSTRUCTIONS:   1. PCP f/u in 1-2 weeks  DIET:   Cardiac diet  ACTIVITY:   Activity as tolerated  OXYGEN:   Home Oxygen: Yes.    Oxygen Delivery: 2 liters/min via Patient connected to nasal cannula oxygen  DISCHARGE LOCATION:   home   If you experience worsening of your admission symptoms, develop shortness of breath, life threatening emergency, suicidal or homicidal thoughts you must seek medical attention immediately by calling 911 or calling your MD immediately  if symptoms less severe.  You Must read complete instructions/literature along with all the possible adverse reactions/side effects for all the Medicines you take and that have been prescribed to you. Take any new Medicines after you have completely understood and accpet all the possible adverse reactions/side effects.   Please note  You were cared for by a hospitalist during your hospital stay. If you have any questions about your discharge medications or the care you received while you were in the hospital after you are discharged, you can call the unit and asked to speak with the hospitalist on call if the hospitalist that took care of you is not available. Once you are discharged, your primary care physician will handle any further medical issues. Please note that NO REFILLS for any discharge medications will be authorized once you are discharged, as it is imperative that you return to your primary care physician (or establish a relationship with a primary care physician if you do not have one) for your aftercare needs so that they can reassess your need for medications and monitor your lab values.    On the day of Discharge:  VITAL SIGNS:   Blood pressure 121/73, pulse 68, temperature 98.1 F (36.7 C), temperature source Oral, resp. rate 20, height 5\' 6"  (1.676 m),  weight 50.3 kg (111 lb), SpO2 (!) 85 %.  PHYSICAL EXAMINATION:    GENERAL:  81 y.o.-year-old patient lying in the bed with no acute distress.  EYES: Pupils equal, round, reactive to light and accommodation. No scleral icterus. Extraocular muscles intact.  HEENT: Head atraumatic, normocephalic. Oropharynx and nasopharynx clear.  NECK:  Supple, no jugular venous distention. No thyroid enlargement, no tenderness.  LUNGS: improving breath sounds bilaterally, scattered wheezing, no rales or crepitation. No use of accessory muscles of respiration.  CARDIOVASCULAR: S1, S2 normal. No rubs, or gallops. 3/6 systolic murmur present ABDOMEN: Soft, nontender, nondistended. Bowel sounds present. No organomegaly or mass.  EXTREMITIES: No pedal edema, cyanosis, or clubbing.  Significant scrotal swelling is present NEUROLOGIC: Cranial nerves II through XII are intact. Muscle strength 5/5 in all extremities. Sensation intact. Gait not checked. Global weakness present tremors are better but has left leg jerks PSYCHIATRIC: The  patient is alert and oriented x 3 SKIN: No obvious rash, lesion, or ulcer.   DATA REVIEW:   CBC  Recent Labs Lab 06/18/16 0429  WBC 13.0*  HGB 13.6  HCT 41.4  PLT 202    Chemistries   Recent Labs Lab 06/17/16 2106 06/18/16 0429 06/21/16 0323  NA 135 131* 132*  K 4.7 4.6 4.6  CL 96* 96* 91*  CO2 34* 26 37*  GLUCOSE 118* 160* 116*  BUN 20 24* 25*  CREATININE 0.75 1.05 0.60*  CALCIUM 9.7 9.3 9.0  MG  --  1.7  --   AST 44*  --   --   ALT 27  --   --   ALKPHOS 64  --   --   BILITOT 0.8  --   --      Microbiology Results  Results for orders placed or performed during the hospital encounter of 06/17/16  MRSA PCR Screening     Status: None   Collection Time: 06/18/16 12:54 AM  Result Value Ref Range Status   MRSA by PCR NEGATIVE NEGATIVE Final    Comment:        The GeneXpert MRSA Assay (FDA approved for NASAL specimens only), is one component of  a comprehensive MRSA colonization surveillance program. It is not intended to diagnose MRSA infection nor to guide or monitor treatment for MRSA infections.     RADIOLOGY:  No results found.   Management plans discussed with the patient, family and they are in agreement.  CODE STATUS:     Code Status Orders        Start     Ordered   06/17/16 2320  Full code  Continuous     06/17/16 2320    Code Status History    Date Active Date Inactive Code Status Order ID Comments User Context   This patient has a current code status but no historical code status.      TOTAL TIME TAKING CARE OF THIS PATIENT: 38 minutes.    Enid Baas M.D on 06/22/2016 at 8:23 AM  Between 7am to 6pm - Pager - [phone]  After 6pm go to [email or web address] - Social research officer, government  [facility] [place] [facility]  Office  [phone]  CC: Primary care physician; [facility]   Note: This dictation was prepared with Dragon dictation along with smaller phrase technology. Any transcriptional errors that result from this process are unintentional.

## 2016-06-22 NOTE — Care Management Important Message (Signed)
Important Message  Patient Details  Name: Scott Richard MRN: [id number] Date of Birth: Jun 23, 1935   Medicare Important Message Given:  Yes    Chapman FitchBOWEN, Broden Holt T, RN 06/22/2016, 12:12 PM

## 2016-06-22 NOTE — Care Management (Signed)
Patient to discharge home today. Patient admitted with respiratory failure.  Patient lives at home with sister.  Home health orders placed for RN, PT and SW.  Orders placed for O2, walker, and nebulizer.  Patient was provided with home health agency preference.  Patient states that he does not have preference of agency. [facility]  RW and portable O2 tank delivered to room.  Nebulizer and Home O2 to be delivered today.  Family to transport at discharge.  RNCM signing off.

## 2016-06-22 NOTE — Progress Notes (Signed)
Physical Therapy Treatment Patient Details Name: Scott Richard MRN: [id number] DOB: April 09, 1936 Today's Date: 06/22/2016    History of Present Illness Pt is a 81 y.o. male presenting to hospital with increasing SOB and cough with congestion x3 days; pt also with mechanical fall down steps at home; O2 67% in triage at ED.  Pt admitted with acute hypoxic/hypercarbic respiratory failure; COPD exacerbation.  Imaging showing acute L 5th rib fx and old R rib fx's.  PMH includes COPD, htn, (+) smoker.  Pt verbally reports breaking his neck (and s/p surgery) about 6 years ago with UE and LE tremors since.    PT Comments    Pt presents with deficits in strength, transfers, mobility, gait, balance, and activity tolerance.  Pt required SBA with extra time and effort with bed mob task and CGA with sit to/from stand transfers with verbal cues for sequencing.  Pt able to amb 1 x 40' and 1 x 20' with QC and min A for stability.  Recommended pt to use RW for improved stability/decreased fall risk with amb.  Pt's SpO2 on 2LO2/min 95-98% at rest but dropped to 83% after amb.  SpO2 returned to >/= 92% in 30-45 sec upon sitting, nursing notified.  Pt will benefit from PT services to address above deficits for decreased caregiver assistance upon discharge.     Follow Up Recommendations  SNF     Equipment Recommendations  Rolling walker with 5" wheels    Recommendations for Other Services       Precautions / Restrictions Precautions Precautions: Fall Restrictions Weight Bearing Restrictions: No    Mobility  Bed Mobility Overal bed mobility: Needs Assistance Bed Mobility: Supine to Sit;Sit to Supine     Supine to sit: Supervision Sit to supine: Supervision   General bed mobility comments: increased time and effort to perform bed mob tasks; heavy use of bed rail  Transfers Overall transfer level: Needs assistance Equipment used: Rolling walker (2 wheeled) Transfers: Sit to/from Stand Sit to  Stand: Min guard         General transfer comment: Mod verbal cues for sequencing during transfers from elevated EOB  Ambulation/Gait Ambulation/Gait assistance: Min assist Ambulation Distance (Feet): 40 Feet Assistive device: Quad cane Gait Pattern/deviations: Decreased step length - left;Decreased step length - right;Drifts right/left   Gait velocity interpretation: <1.8 ft/sec, indicative of risk for recurrent falls General Gait Details: General unsteadiness during amb with QC with occasional Min A to prevent LOB   Stairs            Wheelchair Mobility    Modified Rankin (Stroke Patients Only)       Balance Overall balance assessment: Needs assistance Sitting-balance support: No upper extremity supported Sitting balance-Leahy Scale: Fair Sitting balance - Comments: Some posterior LOB in sitting during seated therex but fair stability in static sitting   Standing balance support: Single extremity supported Standing balance-Leahy Scale: Fair                      Cognition Arousal/Alertness: Awake/alert Behavior During Therapy: WFL for tasks assessed/performed Overall Cognitive Status: Within Functional Limits for tasks assessed                      Exercises Total Joint Exercises Ankle Circles/Pumps: AROM;Both;10 reps Quad Sets: AROM;Both;10 reps Gluteal Sets: AROM;Both;10 reps Heel Slides: AROM;Both;10 reps Hip ABduction/ADduction: AROM;Both;10 reps Long Arc Quad: AROM;Both;10 reps Knee Flexion: AROM;Both;10 reps Marching in Standing: AROM;Both;10 reps  General Comments        Pertinent Vitals/Pain Pain Assessment: No/denies pain    Home Living                      Prior Function            PT Goals (current goals can now be found in the care plan section)      Frequency    Min 2X/week      PT Plan Current plan remains appropriate    Co-evaluation             End of Session Equipment Utilized  During Treatment: Gait belt;Oxygen Activity Tolerance: Patient tolerated treatment well Patient left: in bed;with bed alarm set;with call bell/phone within reach Nurse Communication: Mobility status;Other (comment) (SpO2 levels during session)       Time: [id number] PT Time Calculation (min) (ACUTE ONLY): 30 min  Charges:  $Gait Training: 8-22 mins $Therapeutic Exercise: 8-22 mins                    G Codes:       Elly Modena. Scott Tjay Velazquez PT, DPT 06/22/16, 11:24 AM

## 2016-06-22 NOTE — Progress Notes (Signed)
SATURATION QUALIFICATIONS: (This note is used to comply with regulatory documentation for home oxygen)  Patient Saturations on Room Air at Rest = 84%  Patient Saturations on Room Air while Ambulating = 79%  Patient Saturations on 2Liters of oxygen while Ambulating = 89%  Please briefly explain why patient needs home oxygen:

## 2016-06-23 DIAGNOSIS — F17210 Nicotine dependence, cigarettes, uncomplicated: Secondary | ICD-10-CM | POA: Diagnosis not present

## 2016-06-23 DIAGNOSIS — S2232XD Fracture of one rib, left side, subsequent encounter for fracture with routine healing: Secondary | ICD-10-CM | POA: Diagnosis not present

## 2016-06-23 DIAGNOSIS — J440 Chronic obstructive pulmonary disease with acute lower respiratory infection: Secondary | ICD-10-CM | POA: Diagnosis not present

## 2016-06-23 DIAGNOSIS — R0602 Shortness of breath: Secondary | ICD-10-CM | POA: Diagnosis not present

## 2016-06-23 DIAGNOSIS — G2581 Restless legs syndrome: Secondary | ICD-10-CM | POA: Diagnosis not present

## 2016-06-23 DIAGNOSIS — J069 Acute upper respiratory infection, unspecified: Secondary | ICD-10-CM | POA: Diagnosis not present

## 2016-06-23 DIAGNOSIS — J189 Pneumonia, unspecified organism: Secondary | ICD-10-CM | POA: Diagnosis not present

## 2016-06-23 DIAGNOSIS — F17211 Nicotine dependence, cigarettes, in remission: Secondary | ICD-10-CM | POA: Diagnosis not present

## 2016-06-23 DIAGNOSIS — G250 Essential tremor: Secondary | ICD-10-CM | POA: Diagnosis not present

## 2016-06-23 DIAGNOSIS — J449 Chronic obstructive pulmonary disease, unspecified: Secondary | ICD-10-CM | POA: Diagnosis not present

## 2016-06-23 DIAGNOSIS — J9611 Chronic respiratory failure with hypoxia: Secondary | ICD-10-CM | POA: Diagnosis not present

## 2016-06-23 DIAGNOSIS — I10 Essential (primary) hypertension: Secondary | ICD-10-CM | POA: Diagnosis not present

## 2016-06-23 DIAGNOSIS — J441 Chronic obstructive pulmonary disease with (acute) exacerbation: Secondary | ICD-10-CM | POA: Diagnosis not present

## 2016-06-23 DIAGNOSIS — M1990 Unspecified osteoarthritis, unspecified site: Secondary | ICD-10-CM | POA: Diagnosis not present

## 2016-06-24 DIAGNOSIS — J189 Pneumonia, unspecified organism: Secondary | ICD-10-CM | POA: Diagnosis not present

## 2016-06-24 DIAGNOSIS — I10 Essential (primary) hypertension: Secondary | ICD-10-CM | POA: Diagnosis not present

## 2016-06-24 DIAGNOSIS — S2232XD Fracture of one rib, left side, subsequent encounter for fracture with routine healing: Secondary | ICD-10-CM | POA: Diagnosis not present

## 2016-06-24 DIAGNOSIS — F17210 Nicotine dependence, cigarettes, uncomplicated: Secondary | ICD-10-CM | POA: Diagnosis not present

## 2016-06-24 DIAGNOSIS — G250 Essential tremor: Secondary | ICD-10-CM | POA: Diagnosis not present

## 2016-06-24 DIAGNOSIS — G2581 Restless legs syndrome: Secondary | ICD-10-CM | POA: Diagnosis not present

## 2016-06-24 DIAGNOSIS — J441 Chronic obstructive pulmonary disease with (acute) exacerbation: Secondary | ICD-10-CM | POA: Diagnosis not present

## 2016-06-24 DIAGNOSIS — M1990 Unspecified osteoarthritis, unspecified site: Secondary | ICD-10-CM | POA: Diagnosis not present

## 2016-06-24 DIAGNOSIS — J440 Chronic obstructive pulmonary disease with acute lower respiratory infection: Secondary | ICD-10-CM | POA: Diagnosis not present

## 2016-06-25 DIAGNOSIS — G250 Essential tremor: Secondary | ICD-10-CM | POA: Diagnosis not present

## 2016-06-25 DIAGNOSIS — J189 Pneumonia, unspecified organism: Secondary | ICD-10-CM | POA: Diagnosis not present

## 2016-06-25 DIAGNOSIS — G2581 Restless legs syndrome: Secondary | ICD-10-CM | POA: Diagnosis not present

## 2016-06-25 DIAGNOSIS — J440 Chronic obstructive pulmonary disease with acute lower respiratory infection: Secondary | ICD-10-CM | POA: Diagnosis not present

## 2016-06-25 DIAGNOSIS — M1990 Unspecified osteoarthritis, unspecified site: Secondary | ICD-10-CM | POA: Diagnosis not present

## 2016-06-25 DIAGNOSIS — F17210 Nicotine dependence, cigarettes, uncomplicated: Secondary | ICD-10-CM | POA: Diagnosis not present

## 2016-06-25 DIAGNOSIS — J441 Chronic obstructive pulmonary disease with (acute) exacerbation: Secondary | ICD-10-CM | POA: Diagnosis not present

## 2016-06-25 DIAGNOSIS — S2232XD Fracture of one rib, left side, subsequent encounter for fracture with routine healing: Secondary | ICD-10-CM | POA: Diagnosis not present

## 2016-06-25 DIAGNOSIS — I10 Essential (primary) hypertension: Secondary | ICD-10-CM | POA: Diagnosis not present

## 2016-06-26 DIAGNOSIS — G250 Essential tremor: Secondary | ICD-10-CM | POA: Diagnosis not present

## 2016-06-26 DIAGNOSIS — M1990 Unspecified osteoarthritis, unspecified site: Secondary | ICD-10-CM | POA: Diagnosis not present

## 2016-06-26 DIAGNOSIS — I10 Essential (primary) hypertension: Secondary | ICD-10-CM | POA: Diagnosis not present

## 2016-06-26 DIAGNOSIS — F17210 Nicotine dependence, cigarettes, uncomplicated: Secondary | ICD-10-CM | POA: Diagnosis not present

## 2016-06-26 DIAGNOSIS — J441 Chronic obstructive pulmonary disease with (acute) exacerbation: Secondary | ICD-10-CM | POA: Diagnosis not present

## 2016-06-26 DIAGNOSIS — J440 Chronic obstructive pulmonary disease with acute lower respiratory infection: Secondary | ICD-10-CM | POA: Diagnosis not present

## 2016-06-26 DIAGNOSIS — S2232XD Fracture of one rib, left side, subsequent encounter for fracture with routine healing: Secondary | ICD-10-CM | POA: Diagnosis not present

## 2016-06-26 DIAGNOSIS — G2581 Restless legs syndrome: Secondary | ICD-10-CM | POA: Diagnosis not present

## 2016-06-26 DIAGNOSIS — J189 Pneumonia, unspecified organism: Secondary | ICD-10-CM | POA: Diagnosis not present

## 2016-06-29 DIAGNOSIS — G250 Essential tremor: Secondary | ICD-10-CM | POA: Diagnosis not present

## 2016-06-29 DIAGNOSIS — S2232XD Fracture of one rib, left side, subsequent encounter for fracture with routine healing: Secondary | ICD-10-CM | POA: Diagnosis not present

## 2016-06-29 DIAGNOSIS — J440 Chronic obstructive pulmonary disease with acute lower respiratory infection: Secondary | ICD-10-CM | POA: Diagnosis not present

## 2016-06-29 DIAGNOSIS — J441 Chronic obstructive pulmonary disease with (acute) exacerbation: Secondary | ICD-10-CM | POA: Diagnosis not present

## 2016-06-29 DIAGNOSIS — I10 Essential (primary) hypertension: Secondary | ICD-10-CM | POA: Diagnosis not present

## 2016-06-29 DIAGNOSIS — G2581 Restless legs syndrome: Secondary | ICD-10-CM | POA: Diagnosis not present

## 2016-06-29 DIAGNOSIS — F17210 Nicotine dependence, cigarettes, uncomplicated: Secondary | ICD-10-CM | POA: Diagnosis not present

## 2016-06-29 DIAGNOSIS — J189 Pneumonia, unspecified organism: Secondary | ICD-10-CM | POA: Diagnosis not present

## 2016-06-29 DIAGNOSIS — M1990 Unspecified osteoarthritis, unspecified site: Secondary | ICD-10-CM | POA: Diagnosis not present

## 2016-06-30 ENCOUNTER — Ambulatory Visit (INDEPENDENT_AMBULATORY_CARE_PROVIDER_SITE_OTHER): Payer: Medicare HMO | Admitting: Urology

## 2016-06-30 ENCOUNTER — Encounter: Payer: Self-pay | Admitting: Urology

## 2016-06-30 VITALS — BP 110/64 | HR 60 | Ht 66.0 in | Wt 155.8 lb

## 2016-06-30 DIAGNOSIS — J440 Chronic obstructive pulmonary disease with acute lower respiratory infection: Secondary | ICD-10-CM | POA: Diagnosis not present

## 2016-06-30 DIAGNOSIS — S2232XD Fracture of one rib, left side, subsequent encounter for fracture with routine healing: Secondary | ICD-10-CM | POA: Diagnosis not present

## 2016-06-30 DIAGNOSIS — I10 Essential (primary) hypertension: Secondary | ICD-10-CM | POA: Diagnosis not present

## 2016-06-30 DIAGNOSIS — M1990 Unspecified osteoarthritis, unspecified site: Secondary | ICD-10-CM | POA: Diagnosis not present

## 2016-06-30 DIAGNOSIS — F17210 Nicotine dependence, cigarettes, uncomplicated: Secondary | ICD-10-CM | POA: Diagnosis not present

## 2016-06-30 DIAGNOSIS — N430 Encysted hydrocele: Secondary | ICD-10-CM | POA: Diagnosis not present

## 2016-06-30 DIAGNOSIS — G250 Essential tremor: Secondary | ICD-10-CM | POA: Diagnosis not present

## 2016-06-30 DIAGNOSIS — G2581 Restless legs syndrome: Secondary | ICD-10-CM | POA: Diagnosis not present

## 2016-06-30 DIAGNOSIS — J441 Chronic obstructive pulmonary disease with (acute) exacerbation: Secondary | ICD-10-CM | POA: Diagnosis not present

## 2016-06-30 DIAGNOSIS — J189 Pneumonia, unspecified organism: Secondary | ICD-10-CM | POA: Diagnosis not present

## 2016-06-30 NOTE — Progress Notes (Signed)
06/30/2016 9:45 AM   Scott Richard 04/08/1936 [id number]  [facility] No [facility] on file  Chief Complaint  Patient presents with  . Groin Swelling    HPI: He's had scrotal swelling for 2 years. He has no discomfort and it doesn't limit his activities, but it is very heavy and makes it harder for him to get around given he already has CV/pulm and deconditioning issues. The problem is constant and stable. Associated with LE edema,  CHF, severe COPD/O2 use (was admitted earlier this month for these issues). No other aggravating or alleviating factors. He has no bothersome LUTS. He's had no gross hematuria. No Gu , history or surgery.   PMH: Past Medical History:  Diagnosis Date  . Arthritis   . COPD (chronic obstructive pulmonary disease) (HCC)   . Hypertension     Surgical History: Past Surgical History:  Procedure Laterality Date  . NECK SURGERY    . TONSILLECTOMY      Home Medications:  Allergies as of 06/30/2016   No Known Allergies     Medication List       Accurate as of 06/30/16  9:45 AM. Always use your most recent med list.          fluticasone 50 MCG/ACT nasal spray Commonly known as:  FLONASE Place 2 sprays into both nostrils daily.   guaiFENesin 600 MG 12 hr tablet Commonly known as:  MUCINEX Take 1 tablet (600 mg total) by mouth 2 (two) times daily.   ipratropium-albuterol 0.5-2.5 (3) MG/3ML Soln Commonly known as:  DUONEB Take 3 mLs by nebulization every 6 (six) hours as needed (wheezing).   levofloxacin 500 MG tablet Commonly known as:  LEVAQUIN Take 1 tablet (500 mg total) by mouth daily. X 3 more days   LORazepam 0.5 MG tablet Commonly known as:  ATIVAN Take 1 tablet by mouth daily.   meloxicam 15 MG tablet Commonly known as:  MOBIC Take 1 tablet by mouth daily.   multivitamin with minerals Tabs tablet Take 1 tablet by mouth daily.   oxyCODONE 5 MG immediate release tablet Commonly  known as:  Oxy IR/ROXICODONE Take 1 tablet (5 mg total) by mouth every 6 (six) hours as needed for moderate pain.   pramipexole 0.5 MG tablet Commonly known as:  MIRAPEX Take 1 tablet (0.5 mg total) by mouth 2 (two) times daily.   predniSONE 10 MG (21) Tbpk tablet Commonly known as:  STERAPRED UNI-PAK 21 TAB 6 tabs PO x 1 day 5 tabs PO x 1 day 4 tabs PO x 1 day 3 tabs PO x 1 day 2 tabs PO x 1 day 1 tab PO x 1 day and stop   propranolol 10 MG tablet Commonly known as:  INDERAL Take 1 tablet (10 mg total) by mouth 3 (three) times daily.   SYMBICORT 160-4.5 MCG/ACT inhaler Generic drug:  budesonide-formoterol Inhale 2 puffs into the lungs daily.   VITAMIN D-1000 MAX ST 1000 units tablet Generic drug:  Cholecalciferol Take 1 tablet by mouth daily.       Allergies: No Known Allergies  Family History: Family History  Problem Relation Age of Onset  . Prostate cancer Neg Hx   . Bladder Cancer Neg Hx   . Kidney cancer Neg Hx     Social History:  reports that he has been smoking.  He has never used smokeless tobacco. He reports that he does not drink alcohol or use drugs.  ROS: UROLOGY  Frequent Urination?: No Hard to postpone urination?: No Burning/pain with urination?: No Get up at night to urinate?: Yes Leakage of urine?: No Urine stream starts and stops?: No Trouble starting stream?: No Do you have to strain to urinate?: No Blood in urine?: No Urinary tract infection?: No Sexually transmitted disease?: No Injury to kidneys or bladder?: No Painful intercourse?: No Weak stream?: No Erection problems?: No Penile pain?: No  Gastrointestinal Nausea?: No Vomiting?: No Indigestion/heartburn?: No Diarrhea?: No Constipation?: No  Constitutional Fever: No Night sweats?: No Weight loss?: No Fatigue?: No  Skin Skin rash/lesions?: No Itching?: No  Eyes Blurred vision?: No Double vision?: No  Ears/Nose/Throat Sore throat?: No Sinus problems?:  No  Hematologic/Lymphatic Swollen glands?: No Easy bruising?: Yes  Cardiovascular Leg swelling?: Yes Chest pain?: No  Respiratory Cough?: Yes Shortness of breath?: Yes  Endocrine Excessive thirst?: No  Musculoskeletal Back pain?: Yes Joint pain?: Yes  Neurological Headaches?: No Dizziness?: No  Psychologic Depression?: No Anxiety?: No  Physical Exam: BP 110/64 (BP Location: Left Arm, Patient Position: Sitting, Cuff Size: Normal)   Pulse 60   Ht 5\' 6"  (1.676 m)   Wt 70.7 kg (155 lb 12.8 oz)   BMI 25.15 kg/m   Constitutional:  Alert and oriented, No acute distress, elderly, thin, oxygen use. HEENT: [place] AT, moist mucus membranes.  Trachea midline, no masses. Cardiovascular: No clubbing, cyanosis, or edema. Respiratory: Normal respiratory effort, no increased work of breathing, coarse cough. GI: Abdomen is soft, nontender, nondistended, no abdominal masses GU: No CVA tenderness. Penis - no phimosis, normal foreskin Scrotum - right testicle palpably normal, large left hydrocele, cannot palpate testicle DRE - prostate 30 grams, smooth, no hard area or nodule  Skin: No rashes, bruises or suspicious lesions. Lymph: No cervical or inguinal adenopathy. Neurologic: Grossly intact, no focal deficits, moving all 4 extremities. Psychiatric: Normal mood and affect.  Laboratory Data: Lab Results  Component Value Date   WBC 13.0 (H) 06/18/2016   HGB 13.6 06/18/2016   HCT 41.4 06/18/2016   MCV 91.4 06/18/2016   PLT 202 06/18/2016    Lab Results  Component Value Date   CREATININE 0.60 (L) 06/21/2016    No results found for: PSA  No results found for: TESTOSTERONE  No results found for: HGBA1C  Urinalysis No results found for: COLORURINE, APPEARANCEUR, LABSPEC, PHURINE, GLUCOSEU, HGBUR, BILIRUBINUR, KETONESUR, PROTEINUR, UROBILINOGEN, NITRITE, LEUKOCYTESUR  Pertinent Imaging:   Assessment & Plan:   Left hydrocele - will set up for a scrotal u/s. Discussed  surveillance, hydrocele repair or drainage (infection, bleeding, recurrance). He's not a good surgical candidate, therefore if he's bothered we could consider drainage in office after U/S. An 53 angiocath works well.   There are no diagnoses linked to this encounter.  No Follow-up on file.  Jerilee Field, MD  [facility] [address], [address] [place], [place] [address] [phone]

## 2016-07-02 DIAGNOSIS — J189 Pneumonia, unspecified organism: Secondary | ICD-10-CM | POA: Diagnosis not present

## 2016-07-02 DIAGNOSIS — G2581 Restless legs syndrome: Secondary | ICD-10-CM | POA: Diagnosis not present

## 2016-07-02 DIAGNOSIS — J441 Chronic obstructive pulmonary disease with (acute) exacerbation: Secondary | ICD-10-CM | POA: Diagnosis not present

## 2016-07-02 DIAGNOSIS — G250 Essential tremor: Secondary | ICD-10-CM | POA: Diagnosis not present

## 2016-07-02 DIAGNOSIS — M1990 Unspecified osteoarthritis, unspecified site: Secondary | ICD-10-CM | POA: Diagnosis not present

## 2016-07-02 DIAGNOSIS — F17210 Nicotine dependence, cigarettes, uncomplicated: Secondary | ICD-10-CM | POA: Diagnosis not present

## 2016-07-02 DIAGNOSIS — I10 Essential (primary) hypertension: Secondary | ICD-10-CM | POA: Diagnosis not present

## 2016-07-02 DIAGNOSIS — J440 Chronic obstructive pulmonary disease with acute lower respiratory infection: Secondary | ICD-10-CM | POA: Diagnosis not present

## 2016-07-02 DIAGNOSIS — S2232XD Fracture of one rib, left side, subsequent encounter for fracture with routine healing: Secondary | ICD-10-CM | POA: Diagnosis not present

## 2016-07-06 ENCOUNTER — Ambulatory Visit
Admission: RE | Admit: 2016-07-06 | Discharge: 2016-07-06 | Disposition: A | Discharge status: Home / Self Care | Payer: Medicare HMO | Source: Ambulatory Visit | Attending: Internal Medicine | Admitting: Internal Medicine

## 2016-07-06 ENCOUNTER — Other Ambulatory Visit: Payer: Self-pay | Admitting: Internal Medicine

## 2016-07-06 DIAGNOSIS — J449 Chronic obstructive pulmonary disease, unspecified: Secondary | ICD-10-CM | POA: Diagnosis not present

## 2016-07-06 DIAGNOSIS — R0602 Shortness of breath: Secondary | ICD-10-CM | POA: Diagnosis not present

## 2016-07-06 DIAGNOSIS — I10 Essential (primary) hypertension: Secondary | ICD-10-CM | POA: Diagnosis not present

## 2016-07-06 DIAGNOSIS — J9690 Respiratory failure, unspecified, unspecified whether with hypoxia or hypercapnia: Secondary | ICD-10-CM

## 2016-07-06 DIAGNOSIS — M1990 Unspecified osteoarthritis, unspecified site: Secondary | ICD-10-CM | POA: Diagnosis not present

## 2016-07-06 DIAGNOSIS — J90 Pleural effusion, not elsewhere classified: Secondary | ICD-10-CM | POA: Insufficient documentation

## 2016-07-06 DIAGNOSIS — I2781 Cor pulmonale (chronic): Secondary | ICD-10-CM | POA: Diagnosis not present

## 2016-07-06 DIAGNOSIS — J9811 Atelectasis: Secondary | ICD-10-CM | POA: Insufficient documentation

## 2016-07-06 DIAGNOSIS — J189 Pneumonia, unspecified organism: Secondary | ICD-10-CM | POA: Insufficient documentation

## 2016-07-06 DIAGNOSIS — J441 Chronic obstructive pulmonary disease with (acute) exacerbation: Secondary | ICD-10-CM | POA: Diagnosis not present

## 2016-07-06 DIAGNOSIS — J9611 Chronic respiratory failure with hypoxia: Secondary | ICD-10-CM | POA: Insufficient documentation

## 2016-07-06 DIAGNOSIS — S2232XD Fracture of one rib, left side, subsequent encounter for fracture with routine healing: Secondary | ICD-10-CM | POA: Diagnosis not present

## 2016-07-06 DIAGNOSIS — F17210 Nicotine dependence, cigarettes, uncomplicated: Secondary | ICD-10-CM | POA: Diagnosis not present

## 2016-07-06 DIAGNOSIS — G2581 Restless legs syndrome: Secondary | ICD-10-CM | POA: Diagnosis not present

## 2016-07-06 DIAGNOSIS — R05 Cough: Secondary | ICD-10-CM | POA: Diagnosis not present

## 2016-07-06 DIAGNOSIS — G250 Essential tremor: Secondary | ICD-10-CM | POA: Diagnosis not present

## 2016-07-06 DIAGNOSIS — G894 Chronic pain syndrome: Secondary | ICD-10-CM | POA: Diagnosis not present

## 2016-07-06 DIAGNOSIS — J440 Chronic obstructive pulmonary disease with acute lower respiratory infection: Secondary | ICD-10-CM | POA: Diagnosis not present

## 2016-07-08 DIAGNOSIS — J441 Chronic obstructive pulmonary disease with (acute) exacerbation: Secondary | ICD-10-CM | POA: Diagnosis not present

## 2016-07-08 DIAGNOSIS — G250 Essential tremor: Secondary | ICD-10-CM | POA: Diagnosis not present

## 2016-07-08 DIAGNOSIS — M1990 Unspecified osteoarthritis, unspecified site: Secondary | ICD-10-CM | POA: Diagnosis not present

## 2016-07-08 DIAGNOSIS — J189 Pneumonia, unspecified organism: Secondary | ICD-10-CM | POA: Diagnosis not present

## 2016-07-08 DIAGNOSIS — J440 Chronic obstructive pulmonary disease with acute lower respiratory infection: Secondary | ICD-10-CM | POA: Diagnosis not present

## 2016-07-08 DIAGNOSIS — F17210 Nicotine dependence, cigarettes, uncomplicated: Secondary | ICD-10-CM | POA: Diagnosis not present

## 2016-07-08 DIAGNOSIS — I10 Essential (primary) hypertension: Secondary | ICD-10-CM | POA: Diagnosis not present

## 2016-07-08 DIAGNOSIS — S2232XD Fracture of one rib, left side, subsequent encounter for fracture with routine healing: Secondary | ICD-10-CM | POA: Diagnosis not present

## 2016-07-08 DIAGNOSIS — G2581 Restless legs syndrome: Secondary | ICD-10-CM | POA: Diagnosis not present

## 2016-07-09 DIAGNOSIS — R600 Localized edema: Secondary | ICD-10-CM | POA: Diagnosis not present

## 2016-07-09 DIAGNOSIS — J441 Chronic obstructive pulmonary disease with (acute) exacerbation: Secondary | ICD-10-CM | POA: Diagnosis not present

## 2016-07-09 DIAGNOSIS — J209 Acute bronchitis, unspecified: Secondary | ICD-10-CM | POA: Diagnosis not present

## 2016-07-13 ENCOUNTER — Ambulatory Visit
Admission: RE | Admit: 2016-07-13 | Discharge: 2016-07-13 | Disposition: A | Discharge status: Home / Self Care | Payer: Medicare HMO | Source: Ambulatory Visit | Attending: Urology | Admitting: Urology

## 2016-07-13 DIAGNOSIS — N442 Benign cyst of testis: Secondary | ICD-10-CM | POA: Insufficient documentation

## 2016-07-13 DIAGNOSIS — N430 Encysted hydrocele: Secondary | ICD-10-CM | POA: Diagnosis not present

## 2016-07-13 DIAGNOSIS — N433 Hydrocele, unspecified: Secondary | ICD-10-CM | POA: Diagnosis not present

## 2016-07-14 DIAGNOSIS — G2581 Restless legs syndrome: Secondary | ICD-10-CM | POA: Diagnosis not present

## 2016-07-14 DIAGNOSIS — I10 Essential (primary) hypertension: Secondary | ICD-10-CM | POA: Diagnosis not present

## 2016-07-14 DIAGNOSIS — M1990 Unspecified osteoarthritis, unspecified site: Secondary | ICD-10-CM | POA: Diagnosis not present

## 2016-07-14 DIAGNOSIS — G250 Essential tremor: Secondary | ICD-10-CM | POA: Diagnosis not present

## 2016-07-14 DIAGNOSIS — J440 Chronic obstructive pulmonary disease with acute lower respiratory infection: Secondary | ICD-10-CM | POA: Diagnosis not present

## 2016-07-14 DIAGNOSIS — S2232XD Fracture of one rib, left side, subsequent encounter for fracture with routine healing: Secondary | ICD-10-CM | POA: Diagnosis not present

## 2016-07-14 DIAGNOSIS — J189 Pneumonia, unspecified organism: Secondary | ICD-10-CM | POA: Diagnosis not present

## 2016-07-14 DIAGNOSIS — F17210 Nicotine dependence, cigarettes, uncomplicated: Secondary | ICD-10-CM | POA: Diagnosis not present

## 2016-07-14 DIAGNOSIS — J441 Chronic obstructive pulmonary disease with (acute) exacerbation: Secondary | ICD-10-CM | POA: Diagnosis not present

## 2016-07-15 ENCOUNTER — Ambulatory Visit (INDEPENDENT_AMBULATORY_CARE_PROVIDER_SITE_OTHER): Payer: Medicare HMO | Admitting: Urology

## 2016-07-15 VITALS — BP 99/60 | HR 75 | Ht 69.0 in | Wt 154.1 lb

## 2016-07-15 DIAGNOSIS — S2232XD Fracture of one rib, left side, subsequent encounter for fracture with routine healing: Secondary | ICD-10-CM | POA: Diagnosis not present

## 2016-07-15 DIAGNOSIS — F17210 Nicotine dependence, cigarettes, uncomplicated: Secondary | ICD-10-CM | POA: Diagnosis not present

## 2016-07-15 DIAGNOSIS — M1990 Unspecified osteoarthritis, unspecified site: Secondary | ICD-10-CM | POA: Diagnosis not present

## 2016-07-15 DIAGNOSIS — J189 Pneumonia, unspecified organism: Secondary | ICD-10-CM | POA: Diagnosis not present

## 2016-07-15 DIAGNOSIS — I10 Essential (primary) hypertension: Secondary | ICD-10-CM | POA: Diagnosis not present

## 2016-07-15 DIAGNOSIS — G2581 Restless legs syndrome: Secondary | ICD-10-CM | POA: Diagnosis not present

## 2016-07-15 DIAGNOSIS — J441 Chronic obstructive pulmonary disease with (acute) exacerbation: Secondary | ICD-10-CM | POA: Diagnosis not present

## 2016-07-15 DIAGNOSIS — N433 Hydrocele, unspecified: Secondary | ICD-10-CM

## 2016-07-15 DIAGNOSIS — G250 Essential tremor: Secondary | ICD-10-CM | POA: Diagnosis not present

## 2016-07-15 DIAGNOSIS — J440 Chronic obstructive pulmonary disease with acute lower respiratory infection: Secondary | ICD-10-CM | POA: Diagnosis not present

## 2016-07-15 NOTE — Progress Notes (Signed)
07/15/2016 10:48 AM   Scott Richard Aug 21, 1935 [id number]  Referring provider: [facility] No address on file  Chief Complaint  Patient presents with  . Follow-up    hydrocele    HPI: The patient is a 81 year old male who presents today for follow-up of scrotal swelling. He underwent a scrotal ultrasound which showed a 17.8 cm left hydrocele. He reports no discomfort from it and that it does not limit his activities. His biggest concern was that the swelling may be related to cancer.  Of note, the patient is a very poor surgical candidate. His lower extremity edema, CHF, severe COPD with O2 use. He also has issues with deconditioning. He was recently discharged from the hospital for acute on chronic respiratory failure requiring BiPAP on admission.   PMH: Past Medical History:  Diagnosis Date  . Arthritis   . COPD (chronic obstructive pulmonary disease) (HCC)   . Hypertension     Surgical History: Past Surgical History:  Procedure Laterality Date  . NECK SURGERY    . TONSILLECTOMY      Home Medications:  Allergies as of 07/15/2016   No Known Allergies     Medication List       Accurate as of 07/15/16 10:48 AM. Always use your most recent med list.          doxycycline 100 MG capsule Commonly known as:  VIBRAMYCIN Take 100 mg by mouth 2 (two) times daily.   fluticasone 50 MCG/ACT nasal spray Commonly known as:  FLONASE Place 2 sprays into both nostrils daily.   furosemide 20 MG tablet Commonly known as:  LASIX Take 20 mg by mouth.   guaiFENesin 600 MG 12 hr tablet Commonly known as:  MUCINEX Take 1 tablet (600 mg total) by mouth 2 (two) times daily.   ipratropium-albuterol 0.5-2.5 (3) MG/3ML Soln Commonly known as:  DUONEB Take 3 mLs by nebulization every 6 (six) hours as needed (wheezing).   levofloxacin 500 MG tablet Commonly known as:  LEVAQUIN Take 1 tablet (500 mg total) by mouth daily. X 3 more days   LORazepam  0.5 MG tablet Commonly known as:  ATIVAN Take 1 tablet by mouth daily.   meloxicam 15 MG tablet Commonly known as:  MOBIC Take 1 tablet by mouth daily.   multivitamin with minerals Tabs tablet Take 1 tablet by mouth daily.   oxyCODONE 5 MG immediate release tablet Commonly known as:  Oxy IR/ROXICODONE Take 1 tablet (5 mg total) by mouth every 6 (six) hours as needed for moderate pain.   pramipexole 0.5 MG tablet Commonly known as:  MIRAPEX Take 1 tablet (0.5 mg total) by mouth 2 (two) times daily.   predniSONE 10 MG (21) Tbpk tablet Commonly known as:  STERAPRED UNI-PAK 21 TAB 6 tabs PO x 1 day 5 tabs PO x 1 day 4 tabs PO x 1 day 3 tabs PO x 1 day 2 tabs PO x 1 day 1 tab PO x 1 day and stop   propranolol 10 MG tablet Commonly known as:  INDERAL Take 1 tablet (10 mg total) by mouth 3 (three) times daily.   SYMBICORT 160-4.5 MCG/ACT inhaler Generic drug:  budesonide-formoterol Inhale 2 puffs into the lungs daily.   VITAMIN D-1000 MAX ST 1000 units tablet Generic drug:  Cholecalciferol Take 1 tablet by mouth daily.       Allergies: No Known Allergies  Family History: Family History  Problem Relation Age of Onset  . Prostate cancer  Neg Hx   . Bladder Cancer Neg Hx   . Kidney cancer Neg Hx     Social History:  reports that he has been smoking.  He has never used smokeless tobacco. He reports that he does not drink alcohol or use drugs.  ROS: UROLOGY Frequent Urination?: Yes Hard to postpone urination?: No Burning/pain with urination?: No Get up at night to urinate?: Yes Leakage of urine?: No Urine stream starts and stops?: No Trouble starting stream?: No Do you have to strain to urinate?: No Blood in urine?: No Urinary tract infection?: No Sexually transmitted disease?: No Injury to kidneys or bladder?: No Painful intercourse?: No Weak stream?: No Erection problems?: No Penile pain?: No  Gastrointestinal Nausea?: No Vomiting?:  No Indigestion/heartburn?: No Diarrhea?: No Constipation?: No  Constitutional Fever: No Night sweats?: No Weight loss?: No Fatigue?: No  Skin Skin rash/lesions?: No Itching?: No  Eyes Blurred vision?: No Double vision?: No  Ears/Nose/Throat Sore throat?: No Sinus problems?: No  Hematologic/Lymphatic Swollen glands?: No Easy bruising?: Yes  Cardiovascular Leg swelling?: Yes Chest pain?: No  Respiratory Cough?: Yes Shortness of breath?: Yes  Endocrine Excessive thirst?: Yes  Musculoskeletal Back pain?: Yes Joint pain?: No  Neurological Headaches?: No Dizziness?: No  Psychologic Depression?: No Anxiety?: No  Physical Exam: BP 99/60   Pulse 75   Ht  (1.753 m)   Wt 154 lb 1.6 oz (69.9 kg)   BMI 22.76 kg/m   Constitutional:  Alert and oriented, No acute distress. HEENT: [place] AT, moist mucus membranes.  Trachea midline, no masses. Cardiovascular: No clubbing, cyanosis, or edema. Respiratory: Normal respiratory effort, no increased work of breathing. GI: Abdomen is soft, nontender, nondistended, no abdominal masses GU: No CVA tenderness.  Skin: No rashes, bruises or suspicious lesions. Lymph: No cervical or inguinal adenopathy. Neurologic: Grossly intact, no focal deficits, moving all 4 extremities. Psychiatric: Normal mood and affect.  Laboratory Data: Lab Results  Component Value Date   WBC 13.0 (H) 06/18/2016   HGB 13.6 06/18/2016   HCT 41.4 06/18/2016   MCV 91.4 06/18/2016   PLT 202 06/18/2016    Lab Results  Component Value Date   CREATININE 0.60 (L) 06/21/2016    No results found for: PSA  No results found for: TESTOSTERONE  No results found for: HGBA1C  Urinalysis No results found for: COLORURINE, APPEARANCEUR, LABSPEC, PHURINE, GLUCOSEU, HGBUR, BILIRUBINUR, KETONESUR, PROTEINUR, UROBILINOGEN, NITRITE, LEUKOCYTESUR  Pertinent Imaging: Scrotal ultrasound reviewed as above  Assessment & Plan:   1. Left hydrocele I  discussed with the patient that his swelling is related to his hydrocele as confirmed by ultrasound. We discussed that the only way to remove these as a surgical operation though his medical comorbidities would make it not a candidate for this. In regards to drainage with a needle in the office today, I have recommended against this. It will probably come back likely and a short. Of time. This puts him at risk for infection and spontaneous bleeding. The risk of draining his hydrocele in the office today outweigh the benefits especially considering it will return in short order..  Return if symptoms worsen or fail to improve.  Hildred Laser, MD  [facility] [address], [address] [place], [place] [address] [phone]

## 2016-07-21 DIAGNOSIS — F17210 Nicotine dependence, cigarettes, uncomplicated: Secondary | ICD-10-CM | POA: Diagnosis not present

## 2016-07-21 DIAGNOSIS — M1990 Unspecified osteoarthritis, unspecified site: Secondary | ICD-10-CM | POA: Diagnosis not present

## 2016-07-21 DIAGNOSIS — J189 Pneumonia, unspecified organism: Secondary | ICD-10-CM | POA: Diagnosis not present

## 2016-07-21 DIAGNOSIS — G250 Essential tremor: Secondary | ICD-10-CM | POA: Diagnosis not present

## 2016-07-21 DIAGNOSIS — J441 Chronic obstructive pulmonary disease with (acute) exacerbation: Secondary | ICD-10-CM | POA: Diagnosis not present

## 2016-07-21 DIAGNOSIS — J440 Chronic obstructive pulmonary disease with acute lower respiratory infection: Secondary | ICD-10-CM | POA: Diagnosis not present

## 2016-07-21 DIAGNOSIS — G2581 Restless legs syndrome: Secondary | ICD-10-CM | POA: Diagnosis not present

## 2016-07-21 DIAGNOSIS — S2232XD Fracture of one rib, left side, subsequent encounter for fracture with routine healing: Secondary | ICD-10-CM | POA: Diagnosis not present

## 2016-07-21 DIAGNOSIS — I10 Essential (primary) hypertension: Secondary | ICD-10-CM | POA: Diagnosis not present

## 2016-07-22 DIAGNOSIS — M1990 Unspecified osteoarthritis, unspecified site: Secondary | ICD-10-CM | POA: Diagnosis not present

## 2016-07-22 DIAGNOSIS — G2581 Restless legs syndrome: Secondary | ICD-10-CM | POA: Diagnosis not present

## 2016-07-22 DIAGNOSIS — J189 Pneumonia, unspecified organism: Secondary | ICD-10-CM | POA: Diagnosis not present

## 2016-07-22 DIAGNOSIS — J440 Chronic obstructive pulmonary disease with acute lower respiratory infection: Secondary | ICD-10-CM | POA: Diagnosis not present

## 2016-07-22 DIAGNOSIS — J441 Chronic obstructive pulmonary disease with (acute) exacerbation: Secondary | ICD-10-CM | POA: Diagnosis not present

## 2016-07-22 DIAGNOSIS — I10 Essential (primary) hypertension: Secondary | ICD-10-CM | POA: Diagnosis not present

## 2016-07-22 DIAGNOSIS — G250 Essential tremor: Secondary | ICD-10-CM | POA: Diagnosis not present

## 2016-07-22 DIAGNOSIS — F17210 Nicotine dependence, cigarettes, uncomplicated: Secondary | ICD-10-CM | POA: Diagnosis not present

## 2016-07-22 DIAGNOSIS — S2232XD Fracture of one rib, left side, subsequent encounter for fracture with routine healing: Secondary | ICD-10-CM | POA: Diagnosis not present

## 2016-07-23 DIAGNOSIS — J441 Chronic obstructive pulmonary disease with (acute) exacerbation: Secondary | ICD-10-CM | POA: Diagnosis not present

## 2016-07-27 DIAGNOSIS — G8921 Chronic pain due to trauma: Secondary | ICD-10-CM | POA: Diagnosis not present

## 2016-07-27 DIAGNOSIS — M150 Primary generalized (osteo)arthritis: Secondary | ICD-10-CM | POA: Diagnosis not present

## 2016-07-27 DIAGNOSIS — M542 Cervicalgia: Secondary | ICD-10-CM | POA: Diagnosis not present

## 2016-07-27 DIAGNOSIS — M545 Low back pain: Secondary | ICD-10-CM | POA: Diagnosis not present

## 2016-07-27 DIAGNOSIS — G8929 Other chronic pain: Secondary | ICD-10-CM | POA: Diagnosis not present

## 2016-07-27 DIAGNOSIS — F1120 Opioid dependence, uncomplicated: Secondary | ICD-10-CM | POA: Diagnosis not present

## 2016-07-27 DIAGNOSIS — M154 Erosive (osteo)arthritis: Secondary | ICD-10-CM | POA: Diagnosis not present

## 2016-07-27 DIAGNOSIS — J449 Chronic obstructive pulmonary disease, unspecified: Secondary | ICD-10-CM | POA: Diagnosis not present

## 2016-07-27 DIAGNOSIS — Z79899 Other long term (current) drug therapy: Secondary | ICD-10-CM | POA: Diagnosis not present

## 2016-07-28 ENCOUNTER — Emergency Department
Admission: EM | Admit: 2016-07-28 | Discharge: 2016-07-29 | Disposition: A | Discharge status: Home / Self Care | Payer: Medicare HMO | Attending: Emergency Medicine | Admitting: Emergency Medicine

## 2016-07-28 ENCOUNTER — Emergency Department: Payer: Medicare HMO

## 2016-07-28 DIAGNOSIS — M1990 Unspecified osteoarthritis, unspecified site: Secondary | ICD-10-CM | POA: Diagnosis not present

## 2016-07-28 DIAGNOSIS — Z79899 Other long term (current) drug therapy: Secondary | ICD-10-CM | POA: Insufficient documentation

## 2016-07-28 DIAGNOSIS — S299XXA Unspecified injury of thorax, initial encounter: Secondary | ICD-10-CM | POA: Diagnosis not present

## 2016-07-28 DIAGNOSIS — F17200 Nicotine dependence, unspecified, uncomplicated: Secondary | ICD-10-CM | POA: Insufficient documentation

## 2016-07-28 DIAGNOSIS — J449 Chronic obstructive pulmonary disease, unspecified: Secondary | ICD-10-CM | POA: Diagnosis not present

## 2016-07-28 DIAGNOSIS — R609 Edema, unspecified: Secondary | ICD-10-CM

## 2016-07-28 DIAGNOSIS — J440 Chronic obstructive pulmonary disease with acute lower respiratory infection: Secondary | ICD-10-CM | POA: Diagnosis not present

## 2016-07-28 DIAGNOSIS — M7989 Other specified soft tissue disorders: Secondary | ICD-10-CM | POA: Diagnosis present

## 2016-07-28 DIAGNOSIS — F17210 Nicotine dependence, cigarettes, uncomplicated: Secondary | ICD-10-CM | POA: Diagnosis not present

## 2016-07-28 DIAGNOSIS — S2232XD Fracture of one rib, left side, subsequent encounter for fracture with routine healing: Secondary | ICD-10-CM | POA: Diagnosis not present

## 2016-07-28 DIAGNOSIS — G2581 Restless legs syndrome: Secondary | ICD-10-CM | POA: Diagnosis not present

## 2016-07-28 DIAGNOSIS — R0602 Shortness of breath: Secondary | ICD-10-CM | POA: Diagnosis not present

## 2016-07-28 DIAGNOSIS — R600 Localized edema: Secondary | ICD-10-CM | POA: Diagnosis not present

## 2016-07-28 DIAGNOSIS — J441 Chronic obstructive pulmonary disease with (acute) exacerbation: Secondary | ICD-10-CM | POA: Diagnosis not present

## 2016-07-28 DIAGNOSIS — I10 Essential (primary) hypertension: Secondary | ICD-10-CM | POA: Insufficient documentation

## 2016-07-28 DIAGNOSIS — G250 Essential tremor: Secondary | ICD-10-CM | POA: Diagnosis not present

## 2016-07-28 DIAGNOSIS — J189 Pneumonia, unspecified organism: Secondary | ICD-10-CM | POA: Diagnosis not present

## 2016-07-28 LAB — CBC WITH DIFFERENTIAL/PLATELET
BASOS ABS: 0 10*3/uL (ref 0–0.1)
BASOS PCT: 1 %
EOS ABS: 0.1 10*3/uL (ref 0–0.7)
Eosinophils Relative: 2 %
HCT: 36.4 % — ABNORMAL LOW (ref 40.0–52.0)
Hemoglobin: 12.3 g/dL — ABNORMAL LOW (ref 13.0–18.0)
Lymphocytes Relative: 10 %
Lymphs Abs: 0.7 10*3/uL — ABNORMAL LOW (ref 1.0–3.6)
MCH: 30.5 pg (ref 26.0–34.0)
MCHC: 33.9 g/dL (ref 32.0–36.0)
MCV: 89.8 fL (ref 80.0–100.0)
MONO ABS: 1 10*3/uL (ref 0.2–1.0)
Monocytes Relative: 14 %
NEUTROS ABS: 5.4 10*3/uL (ref 1.4–6.5)
Neutrophils Relative %: 73 %
PLATELETS: 235 10*3/uL (ref 150–440)
RBC: 4.05 MIL/uL — ABNORMAL LOW (ref 4.40–5.90)
RDW: 13.8 % (ref 11.5–14.5)
WBC: 7.3 10*3/uL (ref 3.8–10.6)

## 2016-07-28 LAB — COMPREHENSIVE METABOLIC PANEL
ALBUMIN: 3.4 g/dL — AB (ref 3.5–5.0)
ALT: 22 U/L (ref 17–63)
ANION GAP: 5 (ref 5–15)
AST: 39 U/L (ref 15–41)
Alkaline Phosphatase: 88 U/L (ref 38–126)
BUN: 9 mg/dL (ref 6–20)
CO2: 43 mmol/L — ABNORMAL HIGH (ref 22–32)
Calcium: 9.1 mg/dL (ref 8.9–10.3)
Chloride: 81 mmol/L — ABNORMAL LOW (ref 101–111)
Creatinine, Ser: 0.57 mg/dL — ABNORMAL LOW (ref 0.61–1.24)
GFR calc Af Amer: >60 mL/min (ref >60)
GFR calc non Af Amer: >60 mL/min (ref >60)
GLUCOSE: 119 mg/dL — AB (ref 65–99)
POTASSIUM: 4.1 mmol/L (ref 3.5–5.1)
Sodium: 129 mmol/L — ABNORMAL LOW (ref 135–145)
Total Bilirubin: 0.6 mg/dL (ref 0.3–1.2)
Total Protein: 6.4 g/dL — ABNORMAL LOW (ref 6.5–8.1)

## 2016-07-28 LAB — TROPONIN I

## 2016-07-28 LAB — BRAIN NATRIURETIC PEPTIDE: B Natriuretic Peptide: 266 pg/mL — ABNORMAL HIGH (ref 0.0–100.0)

## 2016-07-28 MED ORDER — IPRATROPIUM-ALBUTEROL 0.5-2.5 (3) MG/3ML IN SOLN: 3.0000 mL | Freq: Once | RESPIRATORY_TRACT | Status: DC

## 2016-07-28 MED ORDER — PREDNISONE 20 MG PO TABS
40.0000 mg | ORAL_TABLET | Freq: Every day | ORAL | 0 refills | Status: DC
Start: 2016-07-28 — End: 2018-02-01

## 2016-07-28 MED ORDER — METHYLPREDNISOLONE SODIUM SUCC 125 MG IJ SOLR
125.0000 mg | Freq: Once | INTRAMUSCULAR | Status: AC
  Administered 2016-07-28: 125 mg via INTRAVENOUS
  Filled 2016-07-28: qty 2

## 2016-07-28 MED ORDER — FUROSEMIDE 10 MG/ML IJ SOLN
60.0000 mg | Freq: Once | INTRAMUSCULAR | Status: AC
  Administered 2016-07-28: 60 mg via INTRAVENOUS
  Filled 2016-07-28: qty 8

## 2016-07-28 MED ORDER — ALBUTEROL SULFATE (2.5 MG/3ML) 0.083% IN NEBU: 5.0000 mg | INHALATION_SOLUTION | Freq: Once | RESPIRATORY_TRACT | Status: DC

## 2016-07-28 NOTE — ED Provider Notes (Signed)
[facility] Emergency Department Provider Note   ____________________________________________   I have reviewed the triage vital signs and the nursing notes.   HISTORY  Chief Complaint Leg Swelling   History limited by: Not Limited   HPI Scott Richard is a 81 y.o. male who presents to the emergency department today because of concerns for shortness of breath and decreased oxygen saturation at home. Furthermore the patient is concern for bilateral leg swelling. Patient was discharged from the hospital last month for COPD exacerbation. Family states that shortly after leaving the hospital he started developing lower extremity swelling. This has gone progressively worse. Patient was started on a diuretic a few days ago per family. The patient has felt some shortness of breath. Denies any fevers. No cough.    Past Medical History:  Diagnosis Date  . Arthritis   . COPD (chronic obstructive pulmonary disease) (HCC)   . Hypertension     Patient Active Problem List   Diagnosis Date Noted  . COPD exacerbation (HCC) 06/17/2016    Past Surgical History:  Procedure Laterality Date  . NECK SURGERY    . TONSILLECTOMY      Prior to Admission medications   Medication Sig Start Date End Date Taking? Authorizing Provider  Cholecalciferol (VITAMIN D-1000 MAX ST) 1000 units tablet Take 1 tablet by mouth daily.   Yes Historical Provider, MD  fluticasone (FLONASE) 50 MCG/ACT nasal spray Place 2 sprays into both nostrils daily. 06/01/16  Yes Historical Provider, MD  furosemide (LASIX) 20 MG tablet Take 20 mg by mouth.   Yes Historical Provider, MD  guaiFENesin (MUCINEX) 600 MG 12 hr tablet Take 600 mg by mouth 2 (two) times daily.   Yes Historical Provider, MD  ipratropium-albuterol (DUONEB) 0.5-2.5 (3) MG/3ML SOLN Take 3 mLs by nebulization every 6 (six) hours as needed (wheezing). 06/22/16  Yes Enid Baas, MD  LORazepam (ATIVAN) 0.5 MG tablet Take 1 tablet by  mouth daily.   Yes Historical Provider, MD  meloxicam (MOBIC) 7.5 MG tablet Take 1 tablet by mouth daily.   Yes Historical Provider, MD  Multiple Vitamin (MULTIVITAMIN WITH MINERALS) TABS tablet Take 1 tablet by mouth daily.   Yes Historical Provider, MD  oxyCODONE (OXY IR/ROXICODONE) 5 MG immediate release tablet Take 1 tablet (5 mg total) by mouth every 6 (six) hours as needed for moderate pain. 06/22/16  Yes Enid Baas, MD  pramipexole (MIRAPEX) 0.5 MG tablet Take 1 tablet (0.5 mg total) by mouth 2 (two) times daily. 06/22/16  Yes Enid Baas, MD  propranolol (INDERAL) 10 MG tablet Take 1 tablet (10 mg total) by mouth 3 (three) times daily. 06/22/16  Yes Enid Baas, MD  SYMBICORT 160-4.5 MCG/ACT inhaler Inhale 2 puffs into the lungs daily. 06/01/16  Yes Historical Provider, MD    Allergies Patient has no known allergies.  Family History  Problem Relation Age of Onset  . Prostate cancer Neg Hx   . Bladder Cancer Neg Hx   . Kidney cancer Neg Hx     Social History Social History  Substance Use Topics  . Smoking status: Current Every Day Smoker  . Smokeless tobacco: Never Used  . Alcohol use No    Review of Systems  Constitutional: Negative for fever. Cardiovascular: Negative for chest pain. Respiratory: Positive for shortness of breath. Gastrointestinal: Negative for abdominal pain, vomiting and diarrhea. Musculoskeletal: Positive for leg swelling. Skin: Negative for rash. Neurological: Negative for headaches, focal weakness or numbness.  10-point ROS otherwise negative.  ____________________________________________   PHYSICAL EXAM:  VITAL SIGNS: ED Triage Vitals  Enc Vitals Group     BP 07/28/16 1806 (!) 123/53     Pulse Rate 07/28/16 1806 61     Resp 07/28/16 1806 16     Temp 07/28/16 1806 97.6 F (36.4 C)     Temp src --      SpO2 07/28/16 1806 93 %     Weight 07/28/16 1805 154 lb (69.9 kg)     Height 07/28/16 1805  (1.753 m)     Head  Circumference --      Peak Flow --      Pain Score 07/28/16 2006 0   Constitutional: Alert and oriented. Well appearing and in no distress. Eyes: Conjunctivae are normal. Normal extraocular movements. ENT   Head: Normocephalic and atraumatic.   Nose: No congestion/rhinnorhea.   Mouth/Throat: Mucous membranes are moist.   Neck: No stridor. Hematological/Lymphatic/Immunilogical: No cervical lymphadenopathy. Cardiovascular: Normal rate, regular rhythm.  No murmurs, rubs, or gallops.  Respiratory: Normal respiratory effort without tachypnea nor retractions. Breath sounds are clear and equal bilaterally. No wheezes/rales/rhonchi. Gastrointestinal: Soft and non tender. No rebound. No guarding.  Genitourinary: Deferred Musculoskeletal: Normal range of motion in all extremities. 2+ bilateral lower extremity swelling. Neurologic:  Normal speech and language. No gross focal neurologic deficits are appreciated.  Skin:  Skin is warm, dry and intact. No rash noted. Psychiatric: Mood and affect are normal. Speech and behavior are normal. Patient exhibits appropriate insight and judgment.  ____________________________________________    LABS (pertinent positives/negatives)  Labs Reviewed  CBC WITH DIFFERENTIAL/PLATELET - Abnormal; Notable for the following:       Result Value   RBC 4.05 (*)    Hemoglobin 12.3 (*)    HCT 36.4 (*)    Lymphs Abs 0.7 (*)    All other components within normal limits  COMPREHENSIVE METABOLIC PANEL - Abnormal; Notable for the following:    Sodium 129 (*)    Chloride 81 (*)    CO2 43 (*)    Glucose, Bld 119 (*)    Creatinine, Ser 0.57 (*)    Total Protein 6.4 (*)    Albumin 3.4 (*)    All other components within normal limits  BRAIN NATRIURETIC PEPTIDE - Abnormal; Notable for the following:    B Natriuretic Peptide 266.0 (*)    All other components within normal limits  TROPONIN I     ____________________________________________   EKG  I,  Phineas Semen, attending physician, personally viewed and interpreted this EKG  EKG Time: 1818 Rate: 63 Rhythm: atrial fibrillation vs sinus rhythm with 1st degree av block Axis: normal Intervals: qtc 380 QRS: low voltage ST changes: no st elevation Impression: abnormal ekg, artifact present   ____________________________________________    RADIOLOGY  CXR IMPRESSION: Stable small left pleural effusion and associated left basilar atelectasis. No new pulmonary process identified.  ____________________________________________   PROCEDURES  Procedures  ____________________________________________   INITIAL IMPRESSION / ASSESSMENT AND PLAN / ED COURSE  Pertinent labs & imaging results that were available during my care of the patient were reviewed by me and considered in my medical decision making (see chart for details).  Patient presented to the emergency department today because of concerns for hypoxia and shortness breath and leg swelling. No crackles or wheezes heard on exam. Most notable finding was the lower extremity edema. Patient was given Lasix here with good urine output. We did and then ambulate the patient on his home  oxygen and he did desat into the 80s. I did at this time recommend admission for the patient. Patient however was adamant that he did not want to be admitted to the hospital. I tried to stress to the patient that I thought it would be best so he can continue to give him treatments and help manage his shortness of breath. However since patient was insistent that did discuss that he should go up on his home oxygen especially when ambulating. Additionally I stressed to the patient importance of following up with his primary care doctor. Will give patient a course of steroids if some of the shortness of breath is secondary to the COPD.  ____________________________________________   FINAL CLINICAL IMPRESSION(S) / ED DIAGNOSES  Final diagnoses:   Peripheral edema  Shortness of breath     Note: This dictation was prepared with Dragon dictation. Any transcriptional errors that result from this process are unintentional     Phineas Semen, MD 07/29/16 0002

## 2016-07-28 NOTE — ED Notes (Signed)
Helped pt use urinal

## 2016-07-28 NOTE — ED Notes (Signed)
Pt ambulated, assisted by this tech and Nellie, Charity fundraiser. Patient on 3L of O2. Stats dropped as low as 87% walking. Pt assisted back to room.

## 2016-07-28 NOTE — ED Triage Notes (Signed)
C/O lower leg swelling and low oxygen saturations with movement x 3 weeks.

## 2016-07-28 NOTE — Discharge Instructions (Signed)
Please seek medical attention for any high fevers, chest pain, shortness of breath, change in behavior, persistent vomiting, bloody stool or any other new or concerning symptoms.  

## 2016-07-29 DIAGNOSIS — S2232XD Fracture of one rib, left side, subsequent encounter for fracture with routine healing: Secondary | ICD-10-CM | POA: Diagnosis not present

## 2016-07-29 DIAGNOSIS — F17210 Nicotine dependence, cigarettes, uncomplicated: Secondary | ICD-10-CM | POA: Diagnosis not present

## 2016-07-29 DIAGNOSIS — I10 Essential (primary) hypertension: Secondary | ICD-10-CM | POA: Diagnosis not present

## 2016-07-29 DIAGNOSIS — J441 Chronic obstructive pulmonary disease with (acute) exacerbation: Secondary | ICD-10-CM | POA: Diagnosis not present

## 2016-07-29 DIAGNOSIS — G2581 Restless legs syndrome: Secondary | ICD-10-CM | POA: Diagnosis not present

## 2016-07-29 DIAGNOSIS — G250 Essential tremor: Secondary | ICD-10-CM | POA: Diagnosis not present

## 2016-07-29 DIAGNOSIS — J189 Pneumonia, unspecified organism: Secondary | ICD-10-CM | POA: Diagnosis not present

## 2016-07-29 DIAGNOSIS — J440 Chronic obstructive pulmonary disease with acute lower respiratory infection: Secondary | ICD-10-CM | POA: Diagnosis not present

## 2016-07-29 DIAGNOSIS — M1990 Unspecified osteoarthritis, unspecified site: Secondary | ICD-10-CM | POA: Diagnosis not present

## 2016-07-30 DIAGNOSIS — J189 Pneumonia, unspecified organism: Secondary | ICD-10-CM | POA: Diagnosis not present

## 2016-07-30 DIAGNOSIS — G2581 Restless legs syndrome: Secondary | ICD-10-CM | POA: Diagnosis not present

## 2016-07-30 DIAGNOSIS — G250 Essential tremor: Secondary | ICD-10-CM | POA: Diagnosis not present

## 2016-07-30 DIAGNOSIS — S2232XD Fracture of one rib, left side, subsequent encounter for fracture with routine healing: Secondary | ICD-10-CM | POA: Diagnosis not present

## 2016-07-30 DIAGNOSIS — J440 Chronic obstructive pulmonary disease with acute lower respiratory infection: Secondary | ICD-10-CM | POA: Diagnosis not present

## 2016-07-30 DIAGNOSIS — F17210 Nicotine dependence, cigarettes, uncomplicated: Secondary | ICD-10-CM | POA: Diagnosis not present

## 2016-07-30 DIAGNOSIS — I10 Essential (primary) hypertension: Secondary | ICD-10-CM | POA: Diagnosis not present

## 2016-07-30 DIAGNOSIS — J441 Chronic obstructive pulmonary disease with (acute) exacerbation: Secondary | ICD-10-CM | POA: Diagnosis not present

## 2016-07-30 DIAGNOSIS — M1990 Unspecified osteoarthritis, unspecified site: Secondary | ICD-10-CM | POA: Diagnosis not present

## 2016-07-31 DIAGNOSIS — J189 Pneumonia, unspecified organism: Secondary | ICD-10-CM | POA: Diagnosis not present

## 2016-07-31 DIAGNOSIS — M1990 Unspecified osteoarthritis, unspecified site: Secondary | ICD-10-CM | POA: Diagnosis not present

## 2016-07-31 DIAGNOSIS — G250 Essential tremor: Secondary | ICD-10-CM | POA: Diagnosis not present

## 2016-07-31 DIAGNOSIS — G2581 Restless legs syndrome: Secondary | ICD-10-CM | POA: Diagnosis not present

## 2016-07-31 DIAGNOSIS — J441 Chronic obstructive pulmonary disease with (acute) exacerbation: Secondary | ICD-10-CM | POA: Diagnosis not present

## 2016-07-31 DIAGNOSIS — F17210 Nicotine dependence, cigarettes, uncomplicated: Secondary | ICD-10-CM | POA: Diagnosis not present

## 2016-07-31 DIAGNOSIS — S2232XD Fracture of one rib, left side, subsequent encounter for fracture with routine healing: Secondary | ICD-10-CM | POA: Diagnosis not present

## 2016-07-31 DIAGNOSIS — I10 Essential (primary) hypertension: Secondary | ICD-10-CM | POA: Diagnosis not present

## 2016-07-31 DIAGNOSIS — J440 Chronic obstructive pulmonary disease with acute lower respiratory infection: Secondary | ICD-10-CM | POA: Diagnosis not present

## 2016-08-03 DIAGNOSIS — S2232XD Fracture of one rib, left side, subsequent encounter for fracture with routine healing: Secondary | ICD-10-CM | POA: Diagnosis not present

## 2016-08-03 DIAGNOSIS — M1990 Unspecified osteoarthritis, unspecified site: Secondary | ICD-10-CM | POA: Diagnosis not present

## 2016-08-03 DIAGNOSIS — G2581 Restless legs syndrome: Secondary | ICD-10-CM | POA: Diagnosis not present

## 2016-08-03 DIAGNOSIS — G250 Essential tremor: Secondary | ICD-10-CM | POA: Diagnosis not present

## 2016-08-03 DIAGNOSIS — J441 Chronic obstructive pulmonary disease with (acute) exacerbation: Secondary | ICD-10-CM | POA: Diagnosis not present

## 2016-08-03 DIAGNOSIS — I10 Essential (primary) hypertension: Secondary | ICD-10-CM | POA: Diagnosis not present

## 2016-08-03 DIAGNOSIS — J440 Chronic obstructive pulmonary disease with acute lower respiratory infection: Secondary | ICD-10-CM | POA: Diagnosis not present

## 2016-08-03 DIAGNOSIS — F17210 Nicotine dependence, cigarettes, uncomplicated: Secondary | ICD-10-CM | POA: Diagnosis not present

## 2016-08-03 DIAGNOSIS — J189 Pneumonia, unspecified organism: Secondary | ICD-10-CM | POA: Diagnosis not present

## 2016-08-04 DIAGNOSIS — F17210 Nicotine dependence, cigarettes, uncomplicated: Secondary | ICD-10-CM | POA: Diagnosis not present

## 2016-08-04 DIAGNOSIS — S2232XD Fracture of one rib, left side, subsequent encounter for fracture with routine healing: Secondary | ICD-10-CM | POA: Diagnosis not present

## 2016-08-04 DIAGNOSIS — J189 Pneumonia, unspecified organism: Secondary | ICD-10-CM | POA: Diagnosis not present

## 2016-08-04 DIAGNOSIS — M1990 Unspecified osteoarthritis, unspecified site: Secondary | ICD-10-CM | POA: Diagnosis not present

## 2016-08-04 DIAGNOSIS — J440 Chronic obstructive pulmonary disease with acute lower respiratory infection: Secondary | ICD-10-CM | POA: Diagnosis not present

## 2016-08-04 DIAGNOSIS — G2581 Restless legs syndrome: Secondary | ICD-10-CM | POA: Diagnosis not present

## 2016-08-04 DIAGNOSIS — I10 Essential (primary) hypertension: Secondary | ICD-10-CM | POA: Diagnosis not present

## 2016-08-04 DIAGNOSIS — G250 Essential tremor: Secondary | ICD-10-CM | POA: Diagnosis not present

## 2016-08-04 DIAGNOSIS — J441 Chronic obstructive pulmonary disease with (acute) exacerbation: Secondary | ICD-10-CM | POA: Diagnosis not present

## 2016-08-05 DIAGNOSIS — G250 Essential tremor: Secondary | ICD-10-CM | POA: Diagnosis not present

## 2016-08-05 DIAGNOSIS — R0602 Shortness of breath: Secondary | ICD-10-CM | POA: Diagnosis not present

## 2016-08-05 DIAGNOSIS — R609 Edema, unspecified: Secondary | ICD-10-CM | POA: Diagnosis not present

## 2016-08-05 DIAGNOSIS — Z9981 Dependence on supplemental oxygen: Secondary | ICD-10-CM | POA: Diagnosis not present

## 2016-08-05 DIAGNOSIS — J189 Pneumonia, unspecified organism: Secondary | ICD-10-CM | POA: Diagnosis not present

## 2016-08-05 DIAGNOSIS — Z0001 Encounter for general adult medical examination with abnormal findings: Secondary | ICD-10-CM | POA: Diagnosis not present

## 2016-08-05 DIAGNOSIS — J441 Chronic obstructive pulmonary disease with (acute) exacerbation: Secondary | ICD-10-CM | POA: Diagnosis not present

## 2016-08-05 DIAGNOSIS — I10 Essential (primary) hypertension: Secondary | ICD-10-CM | POA: Diagnosis not present

## 2016-08-05 DIAGNOSIS — G2581 Restless legs syndrome: Secondary | ICD-10-CM | POA: Diagnosis not present

## 2016-08-05 DIAGNOSIS — F17210 Nicotine dependence, cigarettes, uncomplicated: Secondary | ICD-10-CM | POA: Diagnosis not present

## 2016-08-05 DIAGNOSIS — M1990 Unspecified osteoarthritis, unspecified site: Secondary | ICD-10-CM | POA: Diagnosis not present

## 2016-08-05 DIAGNOSIS — J440 Chronic obstructive pulmonary disease with acute lower respiratory infection: Secondary | ICD-10-CM | POA: Diagnosis not present

## 2016-08-05 DIAGNOSIS — J9611 Chronic respiratory failure with hypoxia: Secondary | ICD-10-CM | POA: Diagnosis not present

## 2016-08-05 DIAGNOSIS — S2232XD Fracture of one rib, left side, subsequent encounter for fracture with routine healing: Secondary | ICD-10-CM | POA: Diagnosis not present

## 2016-08-06 DIAGNOSIS — B351 Tinea unguium: Secondary | ICD-10-CM | POA: Diagnosis not present

## 2016-08-06 DIAGNOSIS — J441 Chronic obstructive pulmonary disease with (acute) exacerbation: Secondary | ICD-10-CM | POA: Diagnosis not present

## 2016-08-06 DIAGNOSIS — F17210 Nicotine dependence, cigarettes, uncomplicated: Secondary | ICD-10-CM | POA: Diagnosis not present

## 2016-08-06 DIAGNOSIS — M79675 Pain in left toe(s): Secondary | ICD-10-CM | POA: Diagnosis not present

## 2016-08-06 DIAGNOSIS — J189 Pneumonia, unspecified organism: Secondary | ICD-10-CM | POA: Diagnosis not present

## 2016-08-06 DIAGNOSIS — M1990 Unspecified osteoarthritis, unspecified site: Secondary | ICD-10-CM | POA: Diagnosis not present

## 2016-08-06 DIAGNOSIS — G2581 Restless legs syndrome: Secondary | ICD-10-CM | POA: Diagnosis not present

## 2016-08-06 DIAGNOSIS — G250 Essential tremor: Secondary | ICD-10-CM | POA: Diagnosis not present

## 2016-08-06 DIAGNOSIS — M79674 Pain in right toe(s): Secondary | ICD-10-CM | POA: Diagnosis not present

## 2016-08-06 DIAGNOSIS — J440 Chronic obstructive pulmonary disease with acute lower respiratory infection: Secondary | ICD-10-CM | POA: Diagnosis not present

## 2016-08-06 DIAGNOSIS — I10 Essential (primary) hypertension: Secondary | ICD-10-CM | POA: Diagnosis not present

## 2016-08-06 DIAGNOSIS — S2232XD Fracture of one rib, left side, subsequent encounter for fracture with routine healing: Secondary | ICD-10-CM | POA: Diagnosis not present

## 2016-08-11 DIAGNOSIS — J441 Chronic obstructive pulmonary disease with (acute) exacerbation: Secondary | ICD-10-CM | POA: Diagnosis not present

## 2016-08-11 DIAGNOSIS — I10 Essential (primary) hypertension: Secondary | ICD-10-CM | POA: Diagnosis not present

## 2016-08-11 DIAGNOSIS — G250 Essential tremor: Secondary | ICD-10-CM | POA: Diagnosis not present

## 2016-08-11 DIAGNOSIS — J189 Pneumonia, unspecified organism: Secondary | ICD-10-CM | POA: Diagnosis not present

## 2016-08-11 DIAGNOSIS — J440 Chronic obstructive pulmonary disease with acute lower respiratory infection: Secondary | ICD-10-CM | POA: Diagnosis not present

## 2016-08-11 DIAGNOSIS — S2232XD Fracture of one rib, left side, subsequent encounter for fracture with routine healing: Secondary | ICD-10-CM | POA: Diagnosis not present

## 2016-08-11 DIAGNOSIS — G2581 Restless legs syndrome: Secondary | ICD-10-CM | POA: Diagnosis not present

## 2016-08-11 DIAGNOSIS — F17210 Nicotine dependence, cigarettes, uncomplicated: Secondary | ICD-10-CM | POA: Diagnosis not present

## 2016-08-11 DIAGNOSIS — M1990 Unspecified osteoarthritis, unspecified site: Secondary | ICD-10-CM | POA: Diagnosis not present

## 2016-08-12 DIAGNOSIS — J441 Chronic obstructive pulmonary disease with (acute) exacerbation: Secondary | ICD-10-CM | POA: Diagnosis not present

## 2016-08-12 DIAGNOSIS — S2232XD Fracture of one rib, left side, subsequent encounter for fracture with routine healing: Secondary | ICD-10-CM | POA: Diagnosis not present

## 2016-08-12 DIAGNOSIS — J440 Chronic obstructive pulmonary disease with acute lower respiratory infection: Secondary | ICD-10-CM | POA: Diagnosis not present

## 2016-08-12 DIAGNOSIS — F17210 Nicotine dependence, cigarettes, uncomplicated: Secondary | ICD-10-CM | POA: Diagnosis not present

## 2016-08-12 DIAGNOSIS — G2581 Restless legs syndrome: Secondary | ICD-10-CM | POA: Diagnosis not present

## 2016-08-12 DIAGNOSIS — M1990 Unspecified osteoarthritis, unspecified site: Secondary | ICD-10-CM | POA: Diagnosis not present

## 2016-08-12 DIAGNOSIS — I10 Essential (primary) hypertension: Secondary | ICD-10-CM | POA: Diagnosis not present

## 2016-08-12 DIAGNOSIS — J189 Pneumonia, unspecified organism: Secondary | ICD-10-CM | POA: Diagnosis not present

## 2016-08-12 DIAGNOSIS — G250 Essential tremor: Secondary | ICD-10-CM | POA: Diagnosis not present

## 2016-08-14 DIAGNOSIS — M1990 Unspecified osteoarthritis, unspecified site: Secondary | ICD-10-CM | POA: Diagnosis not present

## 2016-08-14 DIAGNOSIS — J189 Pneumonia, unspecified organism: Secondary | ICD-10-CM | POA: Diagnosis not present

## 2016-08-14 DIAGNOSIS — F17210 Nicotine dependence, cigarettes, uncomplicated: Secondary | ICD-10-CM | POA: Diagnosis not present

## 2016-08-14 DIAGNOSIS — J440 Chronic obstructive pulmonary disease with acute lower respiratory infection: Secondary | ICD-10-CM | POA: Diagnosis not present

## 2016-08-14 DIAGNOSIS — G250 Essential tremor: Secondary | ICD-10-CM | POA: Diagnosis not present

## 2016-08-14 DIAGNOSIS — S2232XD Fracture of one rib, left side, subsequent encounter for fracture with routine healing: Secondary | ICD-10-CM | POA: Diagnosis not present

## 2016-08-14 DIAGNOSIS — J441 Chronic obstructive pulmonary disease with (acute) exacerbation: Secondary | ICD-10-CM | POA: Diagnosis not present

## 2016-08-14 DIAGNOSIS — G2581 Restless legs syndrome: Secondary | ICD-10-CM | POA: Diagnosis not present

## 2016-08-14 DIAGNOSIS — I10 Essential (primary) hypertension: Secondary | ICD-10-CM | POA: Diagnosis not present

## 2016-08-17 DIAGNOSIS — M1990 Unspecified osteoarthritis, unspecified site: Secondary | ICD-10-CM | POA: Diagnosis not present

## 2016-08-17 DIAGNOSIS — I10 Essential (primary) hypertension: Secondary | ICD-10-CM | POA: Diagnosis not present

## 2016-08-17 DIAGNOSIS — G2581 Restless legs syndrome: Secondary | ICD-10-CM | POA: Diagnosis not present

## 2016-08-17 DIAGNOSIS — J189 Pneumonia, unspecified organism: Secondary | ICD-10-CM | POA: Diagnosis not present

## 2016-08-17 DIAGNOSIS — G250 Essential tremor: Secondary | ICD-10-CM | POA: Diagnosis not present

## 2016-08-17 DIAGNOSIS — S2232XD Fracture of one rib, left side, subsequent encounter for fracture with routine healing: Secondary | ICD-10-CM | POA: Diagnosis not present

## 2016-08-17 DIAGNOSIS — J441 Chronic obstructive pulmonary disease with (acute) exacerbation: Secondary | ICD-10-CM | POA: Diagnosis not present

## 2016-08-17 DIAGNOSIS — J440 Chronic obstructive pulmonary disease with acute lower respiratory infection: Secondary | ICD-10-CM | POA: Diagnosis not present

## 2016-08-17 DIAGNOSIS — F17210 Nicotine dependence, cigarettes, uncomplicated: Secondary | ICD-10-CM | POA: Diagnosis not present

## 2016-08-19 DIAGNOSIS — Z9981 Dependence on supplemental oxygen: Secondary | ICD-10-CM | POA: Diagnosis not present

## 2016-08-19 DIAGNOSIS — R609 Edema, unspecified: Secondary | ICD-10-CM | POA: Diagnosis not present

## 2016-08-19 DIAGNOSIS — F17210 Nicotine dependence, cigarettes, uncomplicated: Secondary | ICD-10-CM | POA: Diagnosis not present

## 2016-08-19 DIAGNOSIS — J449 Chronic obstructive pulmonary disease, unspecified: Secondary | ICD-10-CM | POA: Diagnosis not present

## 2016-08-19 DIAGNOSIS — I10 Essential (primary) hypertension: Secondary | ICD-10-CM | POA: Diagnosis not present

## 2016-08-19 DIAGNOSIS — R0602 Shortness of breath: Secondary | ICD-10-CM | POA: Diagnosis not present

## 2016-08-22 DIAGNOSIS — J441 Chronic obstructive pulmonary disease with (acute) exacerbation: Secondary | ICD-10-CM | POA: Diagnosis not present

## 2016-08-24 DIAGNOSIS — R0602 Shortness of breath: Secondary | ICD-10-CM | POA: Diagnosis not present

## 2016-08-24 DIAGNOSIS — J441 Chronic obstructive pulmonary disease with (acute) exacerbation: Secondary | ICD-10-CM | POA: Diagnosis not present

## 2016-08-24 DIAGNOSIS — I5020 Unspecified systolic (congestive) heart failure: Secondary | ICD-10-CM | POA: Diagnosis not present

## 2016-08-24 DIAGNOSIS — J9690 Respiratory failure, unspecified, unspecified whether with hypoxia or hypercapnia: Secondary | ICD-10-CM | POA: Diagnosis not present

## 2016-08-24 DIAGNOSIS — R269 Unspecified abnormalities of gait and mobility: Secondary | ICD-10-CM | POA: Diagnosis not present

## 2016-08-31 ENCOUNTER — Emergency Department
Admission: EM | Admit: 2016-08-31 | Discharge: 2016-08-31 | Disposition: A | Discharge status: Home / Self Care | Payer: Medicare HMO | Attending: Emergency Medicine | Admitting: Emergency Medicine

## 2016-08-31 ENCOUNTER — Emergency Department: Payer: Medicare HMO

## 2016-08-31 ENCOUNTER — Encounter: Payer: Self-pay | Admitting: Emergency Medicine

## 2016-08-31 DIAGNOSIS — Z79899 Other long term (current) drug therapy: Secondary | ICD-10-CM | POA: Insufficient documentation

## 2016-08-31 DIAGNOSIS — I110 Hypertensive heart disease with heart failure: Secondary | ICD-10-CM | POA: Diagnosis not present

## 2016-08-31 DIAGNOSIS — I509 Heart failure, unspecified: Secondary | ICD-10-CM | POA: Insufficient documentation

## 2016-08-31 DIAGNOSIS — J449 Chronic obstructive pulmonary disease, unspecified: Secondary | ICD-10-CM | POA: Diagnosis not present

## 2016-08-31 DIAGNOSIS — R609 Edema, unspecified: Secondary | ICD-10-CM | POA: Insufficient documentation

## 2016-08-31 DIAGNOSIS — I10 Essential (primary) hypertension: Secondary | ICD-10-CM | POA: Diagnosis not present

## 2016-08-31 DIAGNOSIS — R600 Localized edema: Secondary | ICD-10-CM | POA: Diagnosis not present

## 2016-08-31 DIAGNOSIS — F17200 Nicotine dependence, unspecified, uncomplicated: Secondary | ICD-10-CM | POA: Insufficient documentation

## 2016-08-31 DIAGNOSIS — M7989 Other specified soft tissue disorders: Secondary | ICD-10-CM | POA: Diagnosis present

## 2016-08-31 HISTORY — DX: Heart failure, unspecified: I50.9

## 2016-08-31 LAB — CBC
HCT: 35.8 % — ABNORMAL LOW (ref 40.0–52.0)
HEMOGLOBIN: 11.8 g/dL — AB (ref 13.0–18.0)
MCH: 30.2 pg (ref 26.0–34.0)
MCHC: 32.9 g/dL (ref 32.0–36.0)
MCV: 91.8 fL (ref 80.0–100.0)
Platelets: 242 10*3/uL (ref 150–440)
RBC: 3.9 MIL/uL — AB (ref 4.40–5.90)
RDW: 14.5 % (ref 11.5–14.5)
WBC: 7.7 10*3/uL (ref 3.8–10.6)

## 2016-08-31 LAB — BASIC METABOLIC PANEL
ANION GAP: 2 — AB (ref 5–15)
BUN: 14 mg/dL (ref 6–20)
CO2: 46 mmol/L — AB (ref 22–32)
Calcium: 9.5 mg/dL (ref 8.9–10.3)
Chloride: 87 mmol/L — ABNORMAL LOW (ref 101–111)
Creatinine, Ser: 0.71 mg/dL (ref 0.61–1.24)
GFR calc non Af Amer: >60 mL/min (ref >60)
Glucose, Bld: 82 mg/dL (ref 65–99)
Potassium: 4.1 mmol/L (ref 3.5–5.1)
Sodium: 135 mmol/L (ref 135–145)

## 2016-08-31 LAB — URINALYSIS, COMPLETE (UACMP) WITH MICROSCOPIC
BILIRUBIN URINE: NEGATIVE
Bacteria, UA: NONE SEEN
GLUCOSE, UA: NEGATIVE mg/dL
HGB URINE DIPSTICK: NEGATIVE
Ketones, ur: NEGATIVE mg/dL
LEUKOCYTES UA: NEGATIVE
NITRITE: NEGATIVE
Protein, ur: NEGATIVE mg/dL
RBC / HPF: NONE SEEN RBC/hpf (ref 0–5)
Specific Gravity, Urine: 1.008 (ref 1.005–1.030)
WBC, UA: NONE SEEN WBC/hpf (ref 0–5)
pH: 7 (ref 5.0–8.0)

## 2016-08-31 LAB — TROPONIN I: Troponin I: <0.03 ng/mL (ref <0.03)

## 2016-08-31 MED ORDER — FUROSEMIDE 10 MG/ML IJ SOLN
40.0000 mg | Freq: Once | INTRAMUSCULAR | Status: AC
  Administered 2016-08-31: 40 mg via INTRAVENOUS
  Filled 2016-08-31: qty 4

## 2016-08-31 NOTE — ED Provider Notes (Signed)
[facility] Emergency Department Provider Note  ____________________________________________   None    (approximate) 6:15 PM  I have reviewed the triage vital signs and the nursing notes.   HISTORY  Chief Complaint Leg Swelling  HPI Scott Richard is a 81 y.o. male who presents to the emergency department for evaluation of bilateral lower leg edema. Patient denies chest pain or shortness of breath today. He states that the edema resolves partially overnight, but after being up for a few hours it returns. He reports strict compliance with his medications and denies changes to his regimen since his last hospitalization. He denies pain in the legs, sores, or ulcerations on the skin of the lower extremities.    Past Medical History:  Diagnosis Date  . Arthritis   . CHF (congestive heart failure) (HCC)   . COPD (chronic obstructive pulmonary disease) (HCC)   . Hypertension     Patient Active Problem List   Diagnosis Date Noted  . COPD exacerbation (HCC) 06/17/2016    Past Surgical History:  Procedure Laterality Date  . NECK SURGERY    . TONSILLECTOMY      Prior to Admission medications   Medication Sig Start Date End Date Taking? Authorizing Provider  Cholecalciferol (VITAMIN D-1000 MAX ST) 1000 units tablet Take 1 tablet by mouth daily.   Yes [provider]  fluticasone (FLONASE) 50 MCG/ACT nasal spray Place 2 sprays into both nostrils daily. 06/01/16  Yes [provider]  furosemide (LASIX) 20 MG tablet Take 20 mg by mouth.   Yes [provider]  guaiFENesin (MUCINEX) 600 MG 12 hr tablet Take 600 mg by mouth 2 (two) times daily.   Yes [provider]  LORazepam (ATIVAN) 0.5 MG tablet Take 1 tablet by mouth daily.   Yes [provider]  meloxicam (MOBIC) 7.5 MG tablet Take 1 tablet by mouth daily.   Yes [provider]  Multiple Vitamin (MULTIVITAMIN WITH MINERALS) TABS tablet Take 1 tablet by  mouth daily.   Yes [provider]  pramipexole (MIRAPEX) 0.5 MG tablet Take 1 tablet (0.5 mg total) by mouth 2 (two) times daily. 06/22/16  Yes Enid Baas, MD  predniSONE (DELTASONE) 20 MG tablet Take 2 tablets (40 mg total) by mouth daily. 07/28/16  Yes Phineas Semen, MD  propranolol (INDERAL) 10 MG tablet Take 1 tablet (10 mg total) by mouth 3 (three) times daily. 06/22/16  Yes Enid Baas, MD  SYMBICORT 160-4.5 MCG/ACT inhaler Inhale 2 puffs into the lungs daily. 06/01/16  Yes [provider]  ipratropium-albuterol (DUONEB) 0.5-2.5 (3) MG/3ML SOLN Take 3 mLs by nebulization every 6 (six) hours as needed (wheezing). 06/22/16   Enid Baas, MD  oxyCODONE (OXY IR/ROXICODONE) 5 MG immediate release tablet Take 1 tablet (5 mg total) by mouth every 6 (six) hours as needed for moderate pain. 06/22/16   Enid Baas, MD    Allergies Patient has no known allergies.  Family History  Problem Relation Age of Onset  . Prostate cancer Neg Hx   . Bladder Cancer Neg Hx   . Kidney cancer Neg Hx     Social History Social History  Substance Use Topics  . Smoking status: Current Every Day Smoker  . Smokeless tobacco: Never Used  . Alcohol use No    Review of Systems  Constitutional: No fever/chills Eyes: No visual changes. ENT: No sore throat. Cardiovascular: Denies chest pain. Respiratory: Denies shortness of breath. Gastrointestinal: No abdominal pain.  No nausea, no  vomiting.  No diarrhea.  No constipation. Genitourinary: Negative for dysuria. Musculoskeletal: Negative for back pain. Skin: Negative for rash. Neurological: Negative for headaches, focal weakness or numbness. ____________________________________________   PHYSICAL EXAM:  VITAL SIGNS: ED Triage Vitals  Enc Vitals Group     BP 08/31/16 1503 (!) 127/58     Pulse Rate 08/31/16 1503 73     Resp 08/31/16 1503 16     Temp 08/31/16 1503 98.3 F (36.8 C)     Temp Source 08/31/16  1503 Oral     SpO2 08/31/16 1503 100 %     Weight 08/31/16 1503 142 lb (64.4 kg)     Height 08/31/16 1503 5\' 10"  (1.778 m)     Head Circumference --      Peak Flow --      Pain Score 08/31/16 1517 0     Pain Loc --      Pain Edu? --      Excl. in GC? --     Constitutional: Alert and oriented. Smiling and pleasant. Well appearing and in no acute distress. Eyes: Conjunctivae are normal.  Head: Atraumatic. Nose: No congestion/rhinnorhea. Mouth/Throat: Mucous membranes are moist.  Oropharynx non-erythematous. Neck: No stridor.   Cardiovascular: Normal rate, regular rhythm. Grossly normal heart sounds.  Good peripheral circulation. Respiratory: Normal respiratory effort.  No retractions. Lungs diminished throughout, but CTAB without rales or rhonchi. Gastrointestinal: Soft and nontender. No distention. No ascites.  No abdominal bruits. Musculoskeletal: Bilateral lower extremities with 2+ pitting edema below knees.  Neurologic:  Normal speech and language. No gross focal neurologic deficits are appreciated. Skin:  Skin is warm, dry and intact. No ulcers or skin breakdown noted, although skin is flaky and dry. Skin below the knees has a more pink tone than the rest of the skin, but is diffuse and does not appear as cellulitis. Psychiatric: Mood and affect are normal. Speech and behavior are normal.  ____________________________________________   LABS (all labs ordered are listed, but only abnormal results are displayed)  Labs Reviewed  BASIC METABOLIC PANEL - Abnormal; Notable for the following:       Result Value   Chloride 87 (*)    CO2 46 (*)    Anion gap 2 (*)    All other components within normal limits  CBC - Abnormal; Notable for the following:    RBC 3.90 (*)    Hemoglobin 11.8 (*)    HCT 35.8 (*)    All other components within normal limits  URINALYSIS, COMPLETE (UACMP) WITH MICROSCOPIC - Abnormal; Notable for the following:    Color, Urine YELLOW (*)    APPearance  CLEAR (*)    Squamous Epithelial / LPF 0-5 (*)    All other components within normal limits  TROPONIN I   ____________________________________________  EKG  Sinus rhythm with 1st degree AV block. ____________________________________________  RADIOLOGY  Chest x-ray  1. Negative for pulmonary edema. 2. Trace left pleural effusion, decreased from 07/28/2016. 3. Chronic elevation of the right diaphragm. ____________________________________________   PROCEDURES  Procedure(s) performed: None  Procedures  Critical Care performed: No  ____________________________________________   INITIAL IMPRESSION / ASSESSMENT AND PLAN / ED COURSE  Pertinent labs & imaging results that were available during my care of the patient were reviewed by me and considered in my medical decision making (see chart for details).  81 year old male presenting to the emergency department for treatment of bilateral lower extremity edema. He denies chest pain or shortness of breath  today. Wife tells me that he usually gets some additional lasix when his legs swell like this and it helps. Patient reports his lasix dose at home continues to make him urinate frequently. 40mg  of IV Lasix ordered. Vital signs are reassuring as are his labs and chest x-ray.   8:56 PM Patient had about of urine output while in the ER. He is to be discharged home with a prescription for compression stockings. He is to follow up with the Primary care provider for a follow up and will call tomorrow to schedule an appointment.    ____________________________________________   FINAL CLINICAL IMPRESSION(S) / ED DIAGNOSES  Final diagnoses:  Peripheral edema      NEW MEDICATIONS STARTED DURING THIS VISIT:  Discharge Medication List as of 08/31/2016  8:56 PM       Note:  This document was prepared using Dragon voice recognition software and may include unintentional dictation errors.    Chinita Pester, FNP 08/31/16  2240    Jeanmarie Plant, MD 09/01/16 [phone]

## 2016-08-31 NOTE — ED Notes (Signed)
Bedside toilet in room per pt request . Urine hat in toilet to collect urine sample.

## 2016-08-31 NOTE — ED Triage Notes (Signed)
Pt with swelling in legs since discharged from hospital.  Is on lasix, but legs dont go down.  She called his doctor and they told her to bring him here.

## 2016-09-14 DIAGNOSIS — I2781 Cor pulmonale (chronic): Secondary | ICD-10-CM | POA: Diagnosis not present

## 2016-09-14 DIAGNOSIS — J9611 Chronic respiratory failure with hypoxia: Secondary | ICD-10-CM | POA: Diagnosis not present

## 2016-09-14 DIAGNOSIS — Z9981 Dependence on supplemental oxygen: Secondary | ICD-10-CM | POA: Diagnosis not present

## 2016-09-14 DIAGNOSIS — R0602 Shortness of breath: Secondary | ICD-10-CM | POA: Diagnosis not present

## 2016-09-19 DIAGNOSIS — J449 Chronic obstructive pulmonary disease, unspecified: Secondary | ICD-10-CM | POA: Diagnosis not present

## 2016-09-22 DIAGNOSIS — J441 Chronic obstructive pulmonary disease with (acute) exacerbation: Secondary | ICD-10-CM | POA: Diagnosis not present

## 2016-09-23 DIAGNOSIS — R0602 Shortness of breath: Secondary | ICD-10-CM | POA: Diagnosis not present

## 2016-09-24 DIAGNOSIS — R0602 Shortness of breath: Secondary | ICD-10-CM | POA: Diagnosis not present

## 2016-09-24 DIAGNOSIS — J441 Chronic obstructive pulmonary disease with (acute) exacerbation: Secondary | ICD-10-CM | POA: Diagnosis not present

## 2016-09-24 DIAGNOSIS — I5020 Unspecified systolic (congestive) heart failure: Secondary | ICD-10-CM | POA: Diagnosis not present

## 2016-09-24 DIAGNOSIS — J9690 Respiratory failure, unspecified, unspecified whether with hypoxia or hypercapnia: Secondary | ICD-10-CM | POA: Diagnosis not present

## 2016-09-24 DIAGNOSIS — R269 Unspecified abnormalities of gait and mobility: Secondary | ICD-10-CM | POA: Diagnosis not present

## 2016-10-21 DIAGNOSIS — Z7951 Long term (current) use of inhaled steroids: Secondary | ICD-10-CM | POA: Diagnosis not present

## 2016-10-21 DIAGNOSIS — I499 Cardiac arrhythmia, unspecified: Secondary | ICD-10-CM | POA: Diagnosis not present

## 2016-10-21 DIAGNOSIS — M7989 Other specified soft tissue disorders: Secondary | ICD-10-CM | POA: Diagnosis not present

## 2016-10-21 DIAGNOSIS — I10 Essential (primary) hypertension: Secondary | ICD-10-CM | POA: Diagnosis not present

## 2016-10-21 DIAGNOSIS — I5033 Acute on chronic diastolic (congestive) heart failure: Secondary | ICD-10-CM | POA: Diagnosis not present

## 2016-10-21 DIAGNOSIS — F419 Anxiety disorder, unspecified: Secondary | ICD-10-CM | POA: Diagnosis not present

## 2016-10-21 DIAGNOSIS — R0902 Hypoxemia: Secondary | ICD-10-CM | POA: Diagnosis not present

## 2016-10-21 DIAGNOSIS — R918 Other nonspecific abnormal finding of lung field: Secondary | ICD-10-CM | POA: Diagnosis not present

## 2016-10-21 DIAGNOSIS — J449 Chronic obstructive pulmonary disease, unspecified: Secondary | ICD-10-CM | POA: Diagnosis not present

## 2016-10-21 DIAGNOSIS — R600 Localized edema: Secondary | ICD-10-CM | POA: Diagnosis not present

## 2016-10-21 DIAGNOSIS — J929 Pleural plaque without asbestos: Secondary | ICD-10-CM | POA: Diagnosis not present

## 2016-10-21 DIAGNOSIS — G8929 Other chronic pain: Secondary | ICD-10-CM | POA: Diagnosis not present

## 2016-10-21 DIAGNOSIS — M1990 Unspecified osteoarthritis, unspecified site: Secondary | ICD-10-CM | POA: Diagnosis not present

## 2016-10-21 DIAGNOSIS — I5040 Unspecified combined systolic (congestive) and diastolic (congestive) heart failure: Secondary | ICD-10-CM | POA: Diagnosis not present

## 2016-10-21 DIAGNOSIS — E8770 Fluid overload, unspecified: Secondary | ICD-10-CM | POA: Diagnosis not present

## 2016-10-21 DIAGNOSIS — Z87891 Personal history of nicotine dependence: Secondary | ICD-10-CM | POA: Diagnosis not present

## 2016-10-21 DIAGNOSIS — I110 Hypertensive heart disease with heart failure: Secondary | ICD-10-CM | POA: Diagnosis not present

## 2016-10-21 DIAGNOSIS — J441 Chronic obstructive pulmonary disease with (acute) exacerbation: Secondary | ICD-10-CM | POA: Diagnosis not present

## 2016-10-21 DIAGNOSIS — J984 Other disorders of lung: Secondary | ICD-10-CM | POA: Diagnosis not present

## 2016-10-21 DIAGNOSIS — I509 Heart failure, unspecified: Secondary | ICD-10-CM | POA: Diagnosis not present

## 2016-10-22 DIAGNOSIS — I509 Heart failure, unspecified: Secondary | ICD-10-CM | POA: Insufficient documentation

## 2016-10-22 DIAGNOSIS — R0902 Hypoxemia: Secondary | ICD-10-CM | POA: Insufficient documentation

## 2016-10-22 DIAGNOSIS — I10 Essential (primary) hypertension: Secondary | ICD-10-CM | POA: Insufficient documentation

## 2016-10-24 DIAGNOSIS — J9690 Respiratory failure, unspecified, unspecified whether with hypoxia or hypercapnia: Secondary | ICD-10-CM | POA: Diagnosis not present

## 2016-10-24 DIAGNOSIS — I5020 Unspecified systolic (congestive) heart failure: Secondary | ICD-10-CM | POA: Diagnosis not present

## 2016-10-24 DIAGNOSIS — J441 Chronic obstructive pulmonary disease with (acute) exacerbation: Secondary | ICD-10-CM | POA: Diagnosis not present

## 2016-10-24 DIAGNOSIS — R269 Unspecified abnormalities of gait and mobility: Secondary | ICD-10-CM | POA: Diagnosis not present

## 2016-10-24 DIAGNOSIS — R0602 Shortness of breath: Secondary | ICD-10-CM | POA: Diagnosis not present

## 2016-10-26 ENCOUNTER — Other Ambulatory Visit: Payer: Self-pay | Admitting: *Deleted

## 2016-10-26 DIAGNOSIS — F419 Anxiety disorder, unspecified: Secondary | ICD-10-CM | POA: Diagnosis not present

## 2016-10-26 DIAGNOSIS — M1990 Unspecified osteoarthritis, unspecified site: Secondary | ICD-10-CM | POA: Diagnosis not present

## 2016-10-26 DIAGNOSIS — J441 Chronic obstructive pulmonary disease with (acute) exacerbation: Secondary | ICD-10-CM | POA: Diagnosis not present

## 2016-10-26 DIAGNOSIS — M545 Low back pain: Secondary | ICD-10-CM | POA: Diagnosis not present

## 2016-10-26 DIAGNOSIS — M6281 Muscle weakness (generalized): Secondary | ICD-10-CM | POA: Diagnosis not present

## 2016-10-26 DIAGNOSIS — I110 Hypertensive heart disease with heart failure: Secondary | ICD-10-CM | POA: Diagnosis not present

## 2016-10-26 DIAGNOSIS — Z8673 Personal history of transient ischemic attack (TIA), and cerebral infarction without residual deficits: Secondary | ICD-10-CM | POA: Diagnosis not present

## 2016-10-26 DIAGNOSIS — I5042 Chronic combined systolic (congestive) and diastolic (congestive) heart failure: Secondary | ICD-10-CM | POA: Diagnosis not present

## 2016-10-26 DIAGNOSIS — G8929 Other chronic pain: Secondary | ICD-10-CM | POA: Diagnosis not present

## 2016-10-26 NOTE — Patient Outreach (Signed)
[facility]) Care Management  10/26/2016  LEVANTE SIMONES 1935/09/13 [id number]  Referral via [place]; recent discharge from [facility] hospital  October 23, 2016; determine if needs for transition.  Telephone call to patient; sister -Alease Frame states she is patient's caregiver & speaks for patient. States patient has memory deficit since having stroke 6 years ago.  HIPPA verification received from caregiver. She was advised of reason for call & [facility] care management services.    Sister/caregiver states patient has heart failure and was discharged from [facility] in [place] 7/13.. States he has [facility] home health services 3 days weekly for the next 5 weeks. Also has home aid 6 hrs daily -5 days weekly. Caregiver voices that she manages patient's medication & makes sure he takes as instructed by doctors. No problems getting patient to appointments.  Voices that patient's healthcare needs are being met.  Caregiver declines [facility] services at this time. Consents to receive [facility] care management contact information.  Plan: Send [facility] contact information to patient. Send to care management assistant to close case.  Sherrin Daisy, RN BSN [place] Management Coordinator [facility] Care Management  [phone]

## 2016-10-27 ENCOUNTER — Encounter: Payer: Self-pay | Admitting: *Deleted

## 2016-10-27 DIAGNOSIS — I110 Hypertensive heart disease with heart failure: Secondary | ICD-10-CM | POA: Diagnosis not present

## 2016-10-27 DIAGNOSIS — I440 Atrioventricular block, first degree: Secondary | ICD-10-CM | POA: Diagnosis not present

## 2016-10-27 DIAGNOSIS — R635 Abnormal weight gain: Secondary | ICD-10-CM | POA: Diagnosis not present

## 2016-10-27 DIAGNOSIS — G8929 Other chronic pain: Secondary | ICD-10-CM | POA: Diagnosis not present

## 2016-10-27 DIAGNOSIS — J811 Chronic pulmonary edema: Secondary | ICD-10-CM | POA: Diagnosis not present

## 2016-10-27 DIAGNOSIS — I509 Heart failure, unspecified: Secondary | ICD-10-CM | POA: Diagnosis not present

## 2016-10-27 DIAGNOSIS — J9611 Chronic respiratory failure with hypoxia: Secondary | ICD-10-CM | POA: Diagnosis not present

## 2016-10-27 DIAGNOSIS — Z7951 Long term (current) use of inhaled steroids: Secondary | ICD-10-CM | POA: Diagnosis not present

## 2016-10-27 DIAGNOSIS — Z87891 Personal history of nicotine dependence: Secondary | ICD-10-CM | POA: Diagnosis not present

## 2016-10-27 DIAGNOSIS — J9612 Chronic respiratory failure with hypercapnia: Secondary | ICD-10-CM

## 2016-10-27 DIAGNOSIS — R600 Localized edema: Secondary | ICD-10-CM | POA: Diagnosis not present

## 2016-10-27 DIAGNOSIS — F419 Anxiety disorder, unspecified: Secondary | ICD-10-CM | POA: Diagnosis not present

## 2016-10-27 DIAGNOSIS — I5033 Acute on chronic diastolic (congestive) heart failure: Secondary | ICD-10-CM | POA: Diagnosis not present

## 2016-10-27 DIAGNOSIS — J90 Pleural effusion, not elsewhere classified: Secondary | ICD-10-CM | POA: Diagnosis not present

## 2016-10-27 DIAGNOSIS — J441 Chronic obstructive pulmonary disease with (acute) exacerbation: Secondary | ICD-10-CM | POA: Diagnosis not present

## 2016-10-27 DIAGNOSIS — J449 Chronic obstructive pulmonary disease, unspecified: Secondary | ICD-10-CM | POA: Diagnosis not present

## 2016-10-27 DIAGNOSIS — R609 Edema, unspecified: Secondary | ICD-10-CM | POA: Diagnosis not present

## 2016-10-28 DIAGNOSIS — I5033 Acute on chronic diastolic (congestive) heart failure: Secondary | ICD-10-CM | POA: Diagnosis not present

## 2016-10-29 DIAGNOSIS — M1990 Unspecified osteoarthritis, unspecified site: Secondary | ICD-10-CM | POA: Diagnosis not present

## 2016-10-29 DIAGNOSIS — I5042 Chronic combined systolic (congestive) and diastolic (congestive) heart failure: Secondary | ICD-10-CM | POA: Diagnosis not present

## 2016-10-29 DIAGNOSIS — M545 Low back pain: Secondary | ICD-10-CM | POA: Diagnosis not present

## 2016-10-29 DIAGNOSIS — Z8673 Personal history of transient ischemic attack (TIA), and cerebral infarction without residual deficits: Secondary | ICD-10-CM | POA: Diagnosis not present

## 2016-10-29 DIAGNOSIS — J441 Chronic obstructive pulmonary disease with (acute) exacerbation: Secondary | ICD-10-CM | POA: Diagnosis not present

## 2016-10-29 DIAGNOSIS — F419 Anxiety disorder, unspecified: Secondary | ICD-10-CM | POA: Diagnosis not present

## 2016-10-29 DIAGNOSIS — I110 Hypertensive heart disease with heart failure: Secondary | ICD-10-CM | POA: Diagnosis not present

## 2016-10-29 DIAGNOSIS — G8929 Other chronic pain: Secondary | ICD-10-CM | POA: Diagnosis not present

## 2016-10-29 DIAGNOSIS — M6281 Muscle weakness (generalized): Secondary | ICD-10-CM | POA: Diagnosis not present

## 2016-10-30 DIAGNOSIS — Z9981 Dependence on supplemental oxygen: Secondary | ICD-10-CM | POA: Diagnosis not present

## 2016-10-30 DIAGNOSIS — R251 Tremor, unspecified: Secondary | ICD-10-CM | POA: Diagnosis not present

## 2016-10-30 DIAGNOSIS — R609 Edema, unspecified: Secondary | ICD-10-CM | POA: Diagnosis not present

## 2016-10-30 DIAGNOSIS — J449 Chronic obstructive pulmonary disease, unspecified: Secondary | ICD-10-CM | POA: Diagnosis not present

## 2016-10-30 DIAGNOSIS — I10 Essential (primary) hypertension: Secondary | ICD-10-CM | POA: Diagnosis not present

## 2016-11-02 DIAGNOSIS — Z8673 Personal history of transient ischemic attack (TIA), and cerebral infarction without residual deficits: Secondary | ICD-10-CM | POA: Diagnosis not present

## 2016-11-02 DIAGNOSIS — I5042 Chronic combined systolic (congestive) and diastolic (congestive) heart failure: Secondary | ICD-10-CM | POA: Diagnosis not present

## 2016-11-02 DIAGNOSIS — M545 Low back pain: Secondary | ICD-10-CM | POA: Diagnosis not present

## 2016-11-02 DIAGNOSIS — M1990 Unspecified osteoarthritis, unspecified site: Secondary | ICD-10-CM | POA: Diagnosis not present

## 2016-11-02 DIAGNOSIS — I110 Hypertensive heart disease with heart failure: Secondary | ICD-10-CM | POA: Diagnosis not present

## 2016-11-02 DIAGNOSIS — M6281 Muscle weakness (generalized): Secondary | ICD-10-CM | POA: Diagnosis not present

## 2016-11-02 DIAGNOSIS — F419 Anxiety disorder, unspecified: Secondary | ICD-10-CM | POA: Diagnosis not present

## 2016-11-02 DIAGNOSIS — G8929 Other chronic pain: Secondary | ICD-10-CM | POA: Diagnosis not present

## 2016-11-02 DIAGNOSIS — J441 Chronic obstructive pulmonary disease with (acute) exacerbation: Secondary | ICD-10-CM | POA: Diagnosis not present

## 2016-11-04 DIAGNOSIS — F419 Anxiety disorder, unspecified: Secondary | ICD-10-CM | POA: Diagnosis not present

## 2016-11-04 DIAGNOSIS — I110 Hypertensive heart disease with heart failure: Secondary | ICD-10-CM | POA: Diagnosis not present

## 2016-11-04 DIAGNOSIS — Z8673 Personal history of transient ischemic attack (TIA), and cerebral infarction without residual deficits: Secondary | ICD-10-CM | POA: Diagnosis not present

## 2016-11-04 DIAGNOSIS — J441 Chronic obstructive pulmonary disease with (acute) exacerbation: Secondary | ICD-10-CM | POA: Diagnosis not present

## 2016-11-04 DIAGNOSIS — M1990 Unspecified osteoarthritis, unspecified site: Secondary | ICD-10-CM | POA: Diagnosis not present

## 2016-11-04 DIAGNOSIS — I5042 Chronic combined systolic (congestive) and diastolic (congestive) heart failure: Secondary | ICD-10-CM | POA: Diagnosis not present

## 2016-11-04 DIAGNOSIS — G8929 Other chronic pain: Secondary | ICD-10-CM | POA: Diagnosis not present

## 2016-11-04 DIAGNOSIS — M6281 Muscle weakness (generalized): Secondary | ICD-10-CM | POA: Diagnosis not present

## 2016-11-04 DIAGNOSIS — M545 Low back pain: Secondary | ICD-10-CM | POA: Diagnosis not present

## 2016-11-05 DIAGNOSIS — J449 Chronic obstructive pulmonary disease, unspecified: Secondary | ICD-10-CM | POA: Diagnosis not present

## 2016-11-06 DIAGNOSIS — F419 Anxiety disorder, unspecified: Secondary | ICD-10-CM | POA: Diagnosis not present

## 2016-11-06 DIAGNOSIS — G8929 Other chronic pain: Secondary | ICD-10-CM | POA: Diagnosis not present

## 2016-11-06 DIAGNOSIS — M1990 Unspecified osteoarthritis, unspecified site: Secondary | ICD-10-CM | POA: Diagnosis not present

## 2016-11-06 DIAGNOSIS — J441 Chronic obstructive pulmonary disease with (acute) exacerbation: Secondary | ICD-10-CM | POA: Diagnosis not present

## 2016-11-06 DIAGNOSIS — M545 Low back pain: Secondary | ICD-10-CM | POA: Diagnosis not present

## 2016-11-06 DIAGNOSIS — M6281 Muscle weakness (generalized): Secondary | ICD-10-CM | POA: Diagnosis not present

## 2016-11-06 DIAGNOSIS — I110 Hypertensive heart disease with heart failure: Secondary | ICD-10-CM | POA: Diagnosis not present

## 2016-11-06 DIAGNOSIS — Z8673 Personal history of transient ischemic attack (TIA), and cerebral infarction without residual deficits: Secondary | ICD-10-CM | POA: Diagnosis not present

## 2016-11-06 DIAGNOSIS — I5042 Chronic combined systolic (congestive) and diastolic (congestive) heart failure: Secondary | ICD-10-CM | POA: Diagnosis not present

## 2016-11-09 DIAGNOSIS — G8929 Other chronic pain: Secondary | ICD-10-CM | POA: Diagnosis not present

## 2016-11-09 DIAGNOSIS — M6281 Muscle weakness (generalized): Secondary | ICD-10-CM | POA: Diagnosis not present

## 2016-11-09 DIAGNOSIS — M545 Low back pain: Secondary | ICD-10-CM | POA: Diagnosis not present

## 2016-11-09 DIAGNOSIS — I5042 Chronic combined systolic (congestive) and diastolic (congestive) heart failure: Secondary | ICD-10-CM | POA: Diagnosis not present

## 2016-11-09 DIAGNOSIS — I110 Hypertensive heart disease with heart failure: Secondary | ICD-10-CM | POA: Diagnosis not present

## 2016-11-09 DIAGNOSIS — J441 Chronic obstructive pulmonary disease with (acute) exacerbation: Secondary | ICD-10-CM | POA: Diagnosis not present

## 2016-11-09 DIAGNOSIS — I2781 Cor pulmonale (chronic): Secondary | ICD-10-CM | POA: Diagnosis not present

## 2016-11-09 DIAGNOSIS — F419 Anxiety disorder, unspecified: Secondary | ICD-10-CM | POA: Diagnosis not present

## 2016-11-09 DIAGNOSIS — Z8673 Personal history of transient ischemic attack (TIA), and cerebral infarction without residual deficits: Secondary | ICD-10-CM | POA: Diagnosis not present

## 2016-11-09 DIAGNOSIS — M1990 Unspecified osteoarthritis, unspecified site: Secondary | ICD-10-CM | POA: Diagnosis not present

## 2016-11-09 DIAGNOSIS — Z9981 Dependence on supplemental oxygen: Secondary | ICD-10-CM | POA: Diagnosis not present

## 2016-11-09 DIAGNOSIS — J9611 Chronic respiratory failure with hypoxia: Secondary | ICD-10-CM | POA: Diagnosis not present

## 2016-11-09 DIAGNOSIS — J449 Chronic obstructive pulmonary disease, unspecified: Secondary | ICD-10-CM | POA: Diagnosis not present

## 2016-11-11 DIAGNOSIS — G8929 Other chronic pain: Secondary | ICD-10-CM | POA: Diagnosis not present

## 2016-11-11 DIAGNOSIS — F419 Anxiety disorder, unspecified: Secondary | ICD-10-CM | POA: Diagnosis not present

## 2016-11-11 DIAGNOSIS — M1990 Unspecified osteoarthritis, unspecified site: Secondary | ICD-10-CM | POA: Diagnosis not present

## 2016-11-11 DIAGNOSIS — J441 Chronic obstructive pulmonary disease with (acute) exacerbation: Secondary | ICD-10-CM | POA: Diagnosis not present

## 2016-11-11 DIAGNOSIS — I110 Hypertensive heart disease with heart failure: Secondary | ICD-10-CM | POA: Diagnosis not present

## 2016-11-11 DIAGNOSIS — M545 Low back pain: Secondary | ICD-10-CM | POA: Diagnosis not present

## 2016-11-11 DIAGNOSIS — Z8673 Personal history of transient ischemic attack (TIA), and cerebral infarction without residual deficits: Secondary | ICD-10-CM | POA: Diagnosis not present

## 2016-11-11 DIAGNOSIS — I5042 Chronic combined systolic (congestive) and diastolic (congestive) heart failure: Secondary | ICD-10-CM | POA: Diagnosis not present

## 2016-11-11 DIAGNOSIS — M6281 Muscle weakness (generalized): Secondary | ICD-10-CM | POA: Diagnosis not present

## 2016-11-13 DIAGNOSIS — Z8673 Personal history of transient ischemic attack (TIA), and cerebral infarction without residual deficits: Secondary | ICD-10-CM | POA: Diagnosis not present

## 2016-11-13 DIAGNOSIS — M545 Low back pain: Secondary | ICD-10-CM | POA: Diagnosis not present

## 2016-11-13 DIAGNOSIS — F419 Anxiety disorder, unspecified: Secondary | ICD-10-CM | POA: Diagnosis not present

## 2016-11-13 DIAGNOSIS — I110 Hypertensive heart disease with heart failure: Secondary | ICD-10-CM | POA: Diagnosis not present

## 2016-11-13 DIAGNOSIS — M6281 Muscle weakness (generalized): Secondary | ICD-10-CM | POA: Diagnosis not present

## 2016-11-13 DIAGNOSIS — G8929 Other chronic pain: Secondary | ICD-10-CM | POA: Diagnosis not present

## 2016-11-13 DIAGNOSIS — J441 Chronic obstructive pulmonary disease with (acute) exacerbation: Secondary | ICD-10-CM | POA: Diagnosis not present

## 2016-11-13 DIAGNOSIS — I5042 Chronic combined systolic (congestive) and diastolic (congestive) heart failure: Secondary | ICD-10-CM | POA: Diagnosis not present

## 2016-11-13 DIAGNOSIS — M1990 Unspecified osteoarthritis, unspecified site: Secondary | ICD-10-CM | POA: Diagnosis not present

## 2016-11-16 DIAGNOSIS — I5042 Chronic combined systolic (congestive) and diastolic (congestive) heart failure: Secondary | ICD-10-CM | POA: Diagnosis not present

## 2016-11-16 DIAGNOSIS — I110 Hypertensive heart disease with heart failure: Secondary | ICD-10-CM | POA: Diagnosis not present

## 2016-11-16 DIAGNOSIS — M6281 Muscle weakness (generalized): Secondary | ICD-10-CM | POA: Diagnosis not present

## 2016-11-16 DIAGNOSIS — M1990 Unspecified osteoarthritis, unspecified site: Secondary | ICD-10-CM | POA: Diagnosis not present

## 2016-11-16 DIAGNOSIS — G8929 Other chronic pain: Secondary | ICD-10-CM | POA: Diagnosis not present

## 2016-11-16 DIAGNOSIS — Z8673 Personal history of transient ischemic attack (TIA), and cerebral infarction without residual deficits: Secondary | ICD-10-CM | POA: Diagnosis not present

## 2016-11-16 DIAGNOSIS — M545 Low back pain: Secondary | ICD-10-CM | POA: Diagnosis not present

## 2016-11-16 DIAGNOSIS — F419 Anxiety disorder, unspecified: Secondary | ICD-10-CM | POA: Diagnosis not present

## 2016-11-16 DIAGNOSIS — J441 Chronic obstructive pulmonary disease with (acute) exacerbation: Secondary | ICD-10-CM | POA: Diagnosis not present

## 2016-11-18 DIAGNOSIS — M6281 Muscle weakness (generalized): Secondary | ICD-10-CM | POA: Diagnosis not present

## 2016-11-18 DIAGNOSIS — M545 Low back pain: Secondary | ICD-10-CM | POA: Diagnosis not present

## 2016-11-18 DIAGNOSIS — I110 Hypertensive heart disease with heart failure: Secondary | ICD-10-CM | POA: Diagnosis not present

## 2016-11-18 DIAGNOSIS — J441 Chronic obstructive pulmonary disease with (acute) exacerbation: Secondary | ICD-10-CM | POA: Diagnosis not present

## 2016-11-18 DIAGNOSIS — G8929 Other chronic pain: Secondary | ICD-10-CM | POA: Diagnosis not present

## 2016-11-18 DIAGNOSIS — M1990 Unspecified osteoarthritis, unspecified site: Secondary | ICD-10-CM | POA: Diagnosis not present

## 2016-11-18 DIAGNOSIS — Z8673 Personal history of transient ischemic attack (TIA), and cerebral infarction without residual deficits: Secondary | ICD-10-CM | POA: Diagnosis not present

## 2016-11-18 DIAGNOSIS — I5042 Chronic combined systolic (congestive) and diastolic (congestive) heart failure: Secondary | ICD-10-CM | POA: Diagnosis not present

## 2016-11-18 DIAGNOSIS — F419 Anxiety disorder, unspecified: Secondary | ICD-10-CM | POA: Diagnosis not present

## 2016-11-19 DIAGNOSIS — J441 Chronic obstructive pulmonary disease with (acute) exacerbation: Secondary | ICD-10-CM | POA: Diagnosis not present

## 2016-11-19 DIAGNOSIS — M545 Low back pain: Secondary | ICD-10-CM | POA: Diagnosis not present

## 2016-11-19 DIAGNOSIS — R609 Edema, unspecified: Secondary | ICD-10-CM | POA: Diagnosis not present

## 2016-11-19 DIAGNOSIS — I110 Hypertensive heart disease with heart failure: Secondary | ICD-10-CM | POA: Diagnosis not present

## 2016-11-19 DIAGNOSIS — Z9981 Dependence on supplemental oxygen: Secondary | ICD-10-CM | POA: Diagnosis not present

## 2016-11-19 DIAGNOSIS — G8929 Other chronic pain: Secondary | ICD-10-CM | POA: Diagnosis not present

## 2016-11-19 DIAGNOSIS — I5022 Chronic systolic (congestive) heart failure: Secondary | ICD-10-CM | POA: Diagnosis not present

## 2016-11-19 DIAGNOSIS — J9611 Chronic respiratory failure with hypoxia: Secondary | ICD-10-CM | POA: Diagnosis not present

## 2016-11-19 DIAGNOSIS — F419 Anxiety disorder, unspecified: Secondary | ICD-10-CM | POA: Diagnosis not present

## 2016-11-19 DIAGNOSIS — Z8673 Personal history of transient ischemic attack (TIA), and cerebral infarction without residual deficits: Secondary | ICD-10-CM | POA: Diagnosis not present

## 2016-11-19 DIAGNOSIS — R531 Weakness: Secondary | ICD-10-CM | POA: Diagnosis not present

## 2016-11-19 DIAGNOSIS — M1990 Unspecified osteoarthritis, unspecified site: Secondary | ICD-10-CM | POA: Diagnosis not present

## 2016-11-19 DIAGNOSIS — R262 Difficulty in walking, not elsewhere classified: Secondary | ICD-10-CM | POA: Diagnosis not present

## 2016-11-19 DIAGNOSIS — R251 Tremor, unspecified: Secondary | ICD-10-CM | POA: Diagnosis not present

## 2016-11-19 DIAGNOSIS — M6281 Muscle weakness (generalized): Secondary | ICD-10-CM | POA: Diagnosis not present

## 2016-11-19 DIAGNOSIS — F17210 Nicotine dependence, cigarettes, uncomplicated: Secondary | ICD-10-CM | POA: Diagnosis not present

## 2016-11-19 DIAGNOSIS — I5042 Chronic combined systolic (congestive) and diastolic (congestive) heart failure: Secondary | ICD-10-CM | POA: Diagnosis not present

## 2016-11-22 DIAGNOSIS — J441 Chronic obstructive pulmonary disease with (acute) exacerbation: Secondary | ICD-10-CM | POA: Diagnosis not present

## 2016-11-23 DIAGNOSIS — M1990 Unspecified osteoarthritis, unspecified site: Secondary | ICD-10-CM | POA: Diagnosis not present

## 2016-11-23 DIAGNOSIS — Z8673 Personal history of transient ischemic attack (TIA), and cerebral infarction without residual deficits: Secondary | ICD-10-CM | POA: Diagnosis not present

## 2016-11-23 DIAGNOSIS — F419 Anxiety disorder, unspecified: Secondary | ICD-10-CM | POA: Diagnosis not present

## 2016-11-23 DIAGNOSIS — M6281 Muscle weakness (generalized): Secondary | ICD-10-CM | POA: Diagnosis not present

## 2016-11-23 DIAGNOSIS — J441 Chronic obstructive pulmonary disease with (acute) exacerbation: Secondary | ICD-10-CM | POA: Diagnosis not present

## 2016-11-23 DIAGNOSIS — M545 Low back pain: Secondary | ICD-10-CM | POA: Diagnosis not present

## 2016-11-23 DIAGNOSIS — I110 Hypertensive heart disease with heart failure: Secondary | ICD-10-CM | POA: Diagnosis not present

## 2016-11-23 DIAGNOSIS — G8929 Other chronic pain: Secondary | ICD-10-CM | POA: Diagnosis not present

## 2016-11-23 DIAGNOSIS — I5042 Chronic combined systolic (congestive) and diastolic (congestive) heart failure: Secondary | ICD-10-CM | POA: Diagnosis not present

## 2016-11-24 DIAGNOSIS — R269 Unspecified abnormalities of gait and mobility: Secondary | ICD-10-CM | POA: Diagnosis not present

## 2016-11-24 DIAGNOSIS — J441 Chronic obstructive pulmonary disease with (acute) exacerbation: Secondary | ICD-10-CM | POA: Diagnosis not present

## 2016-11-24 DIAGNOSIS — R0602 Shortness of breath: Secondary | ICD-10-CM | POA: Diagnosis not present

## 2016-11-24 DIAGNOSIS — J9690 Respiratory failure, unspecified, unspecified whether with hypoxia or hypercapnia: Secondary | ICD-10-CM | POA: Diagnosis not present

## 2016-11-24 DIAGNOSIS — I5020 Unspecified systolic (congestive) heart failure: Secondary | ICD-10-CM | POA: Diagnosis not present

## 2016-11-25 DIAGNOSIS — I110 Hypertensive heart disease with heart failure: Secondary | ICD-10-CM | POA: Diagnosis not present

## 2016-11-25 DIAGNOSIS — G8929 Other chronic pain: Secondary | ICD-10-CM | POA: Diagnosis not present

## 2016-11-25 DIAGNOSIS — M545 Low back pain: Secondary | ICD-10-CM | POA: Diagnosis not present

## 2016-11-25 DIAGNOSIS — J441 Chronic obstructive pulmonary disease with (acute) exacerbation: Secondary | ICD-10-CM | POA: Diagnosis not present

## 2016-11-25 DIAGNOSIS — F419 Anxiety disorder, unspecified: Secondary | ICD-10-CM | POA: Diagnosis not present

## 2016-11-25 DIAGNOSIS — M6281 Muscle weakness (generalized): Secondary | ICD-10-CM | POA: Diagnosis not present

## 2016-11-25 DIAGNOSIS — Z8673 Personal history of transient ischemic attack (TIA), and cerebral infarction without residual deficits: Secondary | ICD-10-CM | POA: Diagnosis not present

## 2016-11-25 DIAGNOSIS — I5042 Chronic combined systolic (congestive) and diastolic (congestive) heart failure: Secondary | ICD-10-CM | POA: Diagnosis not present

## 2016-11-25 DIAGNOSIS — M1990 Unspecified osteoarthritis, unspecified site: Secondary | ICD-10-CM | POA: Diagnosis not present

## 2016-11-27 DIAGNOSIS — R32 Unspecified urinary incontinence: Secondary | ICD-10-CM | POA: Diagnosis not present

## 2016-11-30 DIAGNOSIS — Z79899 Other long term (current) drug therapy: Secondary | ICD-10-CM | POA: Diagnosis not present

## 2016-11-30 DIAGNOSIS — M154 Erosive (osteo)arthritis: Secondary | ICD-10-CM | POA: Diagnosis not present

## 2016-11-30 DIAGNOSIS — Z8673 Personal history of transient ischemic attack (TIA), and cerebral infarction without residual deficits: Secondary | ICD-10-CM | POA: Diagnosis not present

## 2016-11-30 DIAGNOSIS — I110 Hypertensive heart disease with heart failure: Secondary | ICD-10-CM | POA: Diagnosis not present

## 2016-11-30 DIAGNOSIS — M150 Primary generalized (osteo)arthritis: Secondary | ICD-10-CM | POA: Diagnosis not present

## 2016-11-30 DIAGNOSIS — I5042 Chronic combined systolic (congestive) and diastolic (congestive) heart failure: Secondary | ICD-10-CM | POA: Diagnosis not present

## 2016-11-30 DIAGNOSIS — G8921 Chronic pain due to trauma: Secondary | ICD-10-CM | POA: Diagnosis not present

## 2016-11-30 DIAGNOSIS — M1990 Unspecified osteoarthritis, unspecified site: Secondary | ICD-10-CM | POA: Diagnosis not present

## 2016-11-30 DIAGNOSIS — F1120 Opioid dependence, uncomplicated: Secondary | ICD-10-CM | POA: Diagnosis not present

## 2016-11-30 DIAGNOSIS — G8929 Other chronic pain: Secondary | ICD-10-CM | POA: Diagnosis not present

## 2016-11-30 DIAGNOSIS — J449 Chronic obstructive pulmonary disease, unspecified: Secondary | ICD-10-CM | POA: Diagnosis not present

## 2016-11-30 DIAGNOSIS — M545 Low back pain: Secondary | ICD-10-CM | POA: Diagnosis not present

## 2016-11-30 DIAGNOSIS — J441 Chronic obstructive pulmonary disease with (acute) exacerbation: Secondary | ICD-10-CM | POA: Diagnosis not present

## 2016-11-30 DIAGNOSIS — F419 Anxiety disorder, unspecified: Secondary | ICD-10-CM | POA: Diagnosis not present

## 2016-11-30 DIAGNOSIS — M6281 Muscle weakness (generalized): Secondary | ICD-10-CM | POA: Diagnosis not present

## 2016-11-30 DIAGNOSIS — M542 Cervicalgia: Secondary | ICD-10-CM | POA: Diagnosis not present

## 2016-12-02 DIAGNOSIS — M545 Low back pain: Secondary | ICD-10-CM | POA: Diagnosis not present

## 2016-12-02 DIAGNOSIS — Z8673 Personal history of transient ischemic attack (TIA), and cerebral infarction without residual deficits: Secondary | ICD-10-CM | POA: Diagnosis not present

## 2016-12-02 DIAGNOSIS — J441 Chronic obstructive pulmonary disease with (acute) exacerbation: Secondary | ICD-10-CM | POA: Diagnosis not present

## 2016-12-02 DIAGNOSIS — M6281 Muscle weakness (generalized): Secondary | ICD-10-CM | POA: Diagnosis not present

## 2016-12-02 DIAGNOSIS — I110 Hypertensive heart disease with heart failure: Secondary | ICD-10-CM | POA: Diagnosis not present

## 2016-12-02 DIAGNOSIS — M1990 Unspecified osteoarthritis, unspecified site: Secondary | ICD-10-CM | POA: Diagnosis not present

## 2016-12-02 DIAGNOSIS — I5042 Chronic combined systolic (congestive) and diastolic (congestive) heart failure: Secondary | ICD-10-CM | POA: Diagnosis not present

## 2016-12-02 DIAGNOSIS — F419 Anxiety disorder, unspecified: Secondary | ICD-10-CM | POA: Diagnosis not present

## 2016-12-02 DIAGNOSIS — G8929 Other chronic pain: Secondary | ICD-10-CM | POA: Diagnosis not present

## 2016-12-06 DIAGNOSIS — J449 Chronic obstructive pulmonary disease, unspecified: Secondary | ICD-10-CM | POA: Diagnosis not present

## 2016-12-07 DIAGNOSIS — M6281 Muscle weakness (generalized): Secondary | ICD-10-CM | POA: Diagnosis not present

## 2016-12-07 DIAGNOSIS — F419 Anxiety disorder, unspecified: Secondary | ICD-10-CM | POA: Diagnosis not present

## 2016-12-07 DIAGNOSIS — G8929 Other chronic pain: Secondary | ICD-10-CM | POA: Diagnosis not present

## 2016-12-07 DIAGNOSIS — M545 Low back pain: Secondary | ICD-10-CM | POA: Diagnosis not present

## 2016-12-07 DIAGNOSIS — M1990 Unspecified osteoarthritis, unspecified site: Secondary | ICD-10-CM | POA: Diagnosis not present

## 2016-12-07 DIAGNOSIS — I5042 Chronic combined systolic (congestive) and diastolic (congestive) heart failure: Secondary | ICD-10-CM | POA: Diagnosis not present

## 2016-12-07 DIAGNOSIS — Z8673 Personal history of transient ischemic attack (TIA), and cerebral infarction without residual deficits: Secondary | ICD-10-CM | POA: Diagnosis not present

## 2016-12-07 DIAGNOSIS — J441 Chronic obstructive pulmonary disease with (acute) exacerbation: Secondary | ICD-10-CM | POA: Diagnosis not present

## 2016-12-07 DIAGNOSIS — I110 Hypertensive heart disease with heart failure: Secondary | ICD-10-CM | POA: Diagnosis not present

## 2016-12-11 DIAGNOSIS — I110 Hypertensive heart disease with heart failure: Secondary | ICD-10-CM | POA: Diagnosis not present

## 2016-12-11 DIAGNOSIS — M545 Low back pain: Secondary | ICD-10-CM | POA: Diagnosis not present

## 2016-12-11 DIAGNOSIS — G8929 Other chronic pain: Secondary | ICD-10-CM | POA: Diagnosis not present

## 2016-12-11 DIAGNOSIS — F419 Anxiety disorder, unspecified: Secondary | ICD-10-CM | POA: Diagnosis not present

## 2016-12-11 DIAGNOSIS — J441 Chronic obstructive pulmonary disease with (acute) exacerbation: Secondary | ICD-10-CM | POA: Diagnosis not present

## 2016-12-11 DIAGNOSIS — I5042 Chronic combined systolic (congestive) and diastolic (congestive) heart failure: Secondary | ICD-10-CM | POA: Diagnosis not present

## 2016-12-11 DIAGNOSIS — M6281 Muscle weakness (generalized): Secondary | ICD-10-CM | POA: Diagnosis not present

## 2016-12-11 DIAGNOSIS — Z8673 Personal history of transient ischemic attack (TIA), and cerebral infarction without residual deficits: Secondary | ICD-10-CM | POA: Diagnosis not present

## 2016-12-11 DIAGNOSIS — M1990 Unspecified osteoarthritis, unspecified site: Secondary | ICD-10-CM | POA: Diagnosis not present

## 2016-12-14 DIAGNOSIS — F419 Anxiety disorder, unspecified: Secondary | ICD-10-CM | POA: Diagnosis not present

## 2016-12-14 DIAGNOSIS — M545 Low back pain: Secondary | ICD-10-CM | POA: Diagnosis not present

## 2016-12-14 DIAGNOSIS — J441 Chronic obstructive pulmonary disease with (acute) exacerbation: Secondary | ICD-10-CM | POA: Diagnosis not present

## 2016-12-14 DIAGNOSIS — M6281 Muscle weakness (generalized): Secondary | ICD-10-CM | POA: Diagnosis not present

## 2016-12-14 DIAGNOSIS — I5042 Chronic combined systolic (congestive) and diastolic (congestive) heart failure: Secondary | ICD-10-CM | POA: Diagnosis not present

## 2016-12-14 DIAGNOSIS — Z8673 Personal history of transient ischemic attack (TIA), and cerebral infarction without residual deficits: Secondary | ICD-10-CM | POA: Diagnosis not present

## 2016-12-14 DIAGNOSIS — M1990 Unspecified osteoarthritis, unspecified site: Secondary | ICD-10-CM | POA: Diagnosis not present

## 2016-12-14 DIAGNOSIS — G8929 Other chronic pain: Secondary | ICD-10-CM | POA: Diagnosis not present

## 2016-12-14 DIAGNOSIS — I110 Hypertensive heart disease with heart failure: Secondary | ICD-10-CM | POA: Diagnosis not present

## 2016-12-16 DIAGNOSIS — M6281 Muscle weakness (generalized): Secondary | ICD-10-CM | POA: Diagnosis not present

## 2016-12-16 DIAGNOSIS — G8929 Other chronic pain: Secondary | ICD-10-CM | POA: Diagnosis not present

## 2016-12-16 DIAGNOSIS — Z8673 Personal history of transient ischemic attack (TIA), and cerebral infarction without residual deficits: Secondary | ICD-10-CM | POA: Diagnosis not present

## 2016-12-16 DIAGNOSIS — F419 Anxiety disorder, unspecified: Secondary | ICD-10-CM | POA: Diagnosis not present

## 2016-12-16 DIAGNOSIS — J441 Chronic obstructive pulmonary disease with (acute) exacerbation: Secondary | ICD-10-CM | POA: Diagnosis not present

## 2016-12-16 DIAGNOSIS — M545 Low back pain: Secondary | ICD-10-CM | POA: Diagnosis not present

## 2016-12-16 DIAGNOSIS — M1990 Unspecified osteoarthritis, unspecified site: Secondary | ICD-10-CM | POA: Diagnosis not present

## 2016-12-16 DIAGNOSIS — I5042 Chronic combined systolic (congestive) and diastolic (congestive) heart failure: Secondary | ICD-10-CM | POA: Diagnosis not present

## 2016-12-16 DIAGNOSIS — I110 Hypertensive heart disease with heart failure: Secondary | ICD-10-CM | POA: Diagnosis not present

## 2016-12-17 DIAGNOSIS — J9611 Chronic respiratory failure with hypoxia: Secondary | ICD-10-CM | POA: Diagnosis not present

## 2016-12-17 DIAGNOSIS — I2781 Cor pulmonale (chronic): Secondary | ICD-10-CM | POA: Diagnosis not present

## 2016-12-17 DIAGNOSIS — J449 Chronic obstructive pulmonary disease, unspecified: Secondary | ICD-10-CM | POA: Diagnosis not present

## 2016-12-17 DIAGNOSIS — I5022 Chronic systolic (congestive) heart failure: Secondary | ICD-10-CM | POA: Diagnosis not present

## 2016-12-18 DIAGNOSIS — J9611 Chronic respiratory failure with hypoxia: Secondary | ICD-10-CM | POA: Diagnosis not present

## 2016-12-18 DIAGNOSIS — I2781 Cor pulmonale (chronic): Secondary | ICD-10-CM | POA: Diagnosis not present

## 2016-12-18 DIAGNOSIS — I5022 Chronic systolic (congestive) heart failure: Secondary | ICD-10-CM | POA: Diagnosis not present

## 2016-12-18 DIAGNOSIS — J449 Chronic obstructive pulmonary disease, unspecified: Secondary | ICD-10-CM | POA: Diagnosis not present

## 2016-12-21 DIAGNOSIS — M545 Low back pain: Secondary | ICD-10-CM | POA: Diagnosis not present

## 2016-12-21 DIAGNOSIS — M1990 Unspecified osteoarthritis, unspecified site: Secondary | ICD-10-CM | POA: Diagnosis not present

## 2016-12-21 DIAGNOSIS — Z8673 Personal history of transient ischemic attack (TIA), and cerebral infarction without residual deficits: Secondary | ICD-10-CM | POA: Diagnosis not present

## 2016-12-21 DIAGNOSIS — I5042 Chronic combined systolic (congestive) and diastolic (congestive) heart failure: Secondary | ICD-10-CM | POA: Diagnosis not present

## 2016-12-21 DIAGNOSIS — M6281 Muscle weakness (generalized): Secondary | ICD-10-CM | POA: Diagnosis not present

## 2016-12-21 DIAGNOSIS — G8929 Other chronic pain: Secondary | ICD-10-CM | POA: Diagnosis not present

## 2016-12-21 DIAGNOSIS — F419 Anxiety disorder, unspecified: Secondary | ICD-10-CM | POA: Diagnosis not present

## 2016-12-21 DIAGNOSIS — I110 Hypertensive heart disease with heart failure: Secondary | ICD-10-CM | POA: Diagnosis not present

## 2016-12-21 DIAGNOSIS — J441 Chronic obstructive pulmonary disease with (acute) exacerbation: Secondary | ICD-10-CM | POA: Diagnosis not present

## 2016-12-23 DIAGNOSIS — I5042 Chronic combined systolic (congestive) and diastolic (congestive) heart failure: Secondary | ICD-10-CM | POA: Diagnosis not present

## 2016-12-23 DIAGNOSIS — J441 Chronic obstructive pulmonary disease with (acute) exacerbation: Secondary | ICD-10-CM | POA: Diagnosis not present

## 2016-12-23 DIAGNOSIS — M545 Low back pain: Secondary | ICD-10-CM | POA: Diagnosis not present

## 2016-12-23 DIAGNOSIS — I110 Hypertensive heart disease with heart failure: Secondary | ICD-10-CM | POA: Diagnosis not present

## 2016-12-23 DIAGNOSIS — Z8673 Personal history of transient ischemic attack (TIA), and cerebral infarction without residual deficits: Secondary | ICD-10-CM | POA: Diagnosis not present

## 2016-12-23 DIAGNOSIS — M1990 Unspecified osteoarthritis, unspecified site: Secondary | ICD-10-CM | POA: Diagnosis not present

## 2016-12-23 DIAGNOSIS — M6281 Muscle weakness (generalized): Secondary | ICD-10-CM | POA: Diagnosis not present

## 2016-12-23 DIAGNOSIS — G8929 Other chronic pain: Secondary | ICD-10-CM | POA: Diagnosis not present

## 2016-12-23 DIAGNOSIS — F419 Anxiety disorder, unspecified: Secondary | ICD-10-CM | POA: Diagnosis not present

## 2016-12-25 DIAGNOSIS — R0602 Shortness of breath: Secondary | ICD-10-CM | POA: Diagnosis not present

## 2016-12-25 DIAGNOSIS — J441 Chronic obstructive pulmonary disease with (acute) exacerbation: Secondary | ICD-10-CM | POA: Diagnosis not present

## 2016-12-25 DIAGNOSIS — J9690 Respiratory failure, unspecified, unspecified whether with hypoxia or hypercapnia: Secondary | ICD-10-CM | POA: Diagnosis not present

## 2016-12-25 DIAGNOSIS — R269 Unspecified abnormalities of gait and mobility: Secondary | ICD-10-CM | POA: Diagnosis not present

## 2016-12-25 DIAGNOSIS — I5020 Unspecified systolic (congestive) heart failure: Secondary | ICD-10-CM | POA: Diagnosis not present

## 2016-12-28 DIAGNOSIS — R32 Unspecified urinary incontinence: Secondary | ICD-10-CM | POA: Diagnosis not present

## 2016-12-30 DIAGNOSIS — I110 Hypertensive heart disease with heart failure: Secondary | ICD-10-CM | POA: Diagnosis not present

## 2016-12-30 DIAGNOSIS — M1990 Unspecified osteoarthritis, unspecified site: Secondary | ICD-10-CM | POA: Diagnosis not present

## 2016-12-30 DIAGNOSIS — M542 Cervicalgia: Secondary | ICD-10-CM | POA: Diagnosis not present

## 2016-12-30 DIAGNOSIS — M6281 Muscle weakness (generalized): Secondary | ICD-10-CM | POA: Diagnosis not present

## 2016-12-30 DIAGNOSIS — I5042 Chronic combined systolic (congestive) and diastolic (congestive) heart failure: Secondary | ICD-10-CM | POA: Diagnosis not present

## 2016-12-30 DIAGNOSIS — J449 Chronic obstructive pulmonary disease, unspecified: Secondary | ICD-10-CM | POA: Diagnosis not present

## 2016-12-30 DIAGNOSIS — F419 Anxiety disorder, unspecified: Secondary | ICD-10-CM | POA: Diagnosis not present

## 2016-12-30 DIAGNOSIS — M545 Low back pain: Secondary | ICD-10-CM | POA: Diagnosis not present

## 2016-12-30 DIAGNOSIS — G8929 Other chronic pain: Secondary | ICD-10-CM | POA: Diagnosis not present

## 2017-01-01 DIAGNOSIS — G8929 Other chronic pain: Secondary | ICD-10-CM | POA: Diagnosis not present

## 2017-01-01 DIAGNOSIS — M545 Low back pain: Secondary | ICD-10-CM | POA: Diagnosis not present

## 2017-01-01 DIAGNOSIS — M1990 Unspecified osteoarthritis, unspecified site: Secondary | ICD-10-CM | POA: Diagnosis not present

## 2017-01-01 DIAGNOSIS — F419 Anxiety disorder, unspecified: Secondary | ICD-10-CM | POA: Diagnosis not present

## 2017-01-01 DIAGNOSIS — J449 Chronic obstructive pulmonary disease, unspecified: Secondary | ICD-10-CM | POA: Diagnosis not present

## 2017-01-01 DIAGNOSIS — M6281 Muscle weakness (generalized): Secondary | ICD-10-CM | POA: Diagnosis not present

## 2017-01-01 DIAGNOSIS — I5042 Chronic combined systolic (congestive) and diastolic (congestive) heart failure: Secondary | ICD-10-CM | POA: Diagnosis not present

## 2017-01-01 DIAGNOSIS — M542 Cervicalgia: Secondary | ICD-10-CM | POA: Diagnosis not present

## 2017-01-01 DIAGNOSIS — I110 Hypertensive heart disease with heart failure: Secondary | ICD-10-CM | POA: Diagnosis not present

## 2017-01-05 DIAGNOSIS — M1990 Unspecified osteoarthritis, unspecified site: Secondary | ICD-10-CM | POA: Diagnosis not present

## 2017-01-05 DIAGNOSIS — G8929 Other chronic pain: Secondary | ICD-10-CM | POA: Diagnosis not present

## 2017-01-05 DIAGNOSIS — M542 Cervicalgia: Secondary | ICD-10-CM | POA: Diagnosis not present

## 2017-01-05 DIAGNOSIS — I110 Hypertensive heart disease with heart failure: Secondary | ICD-10-CM | POA: Diagnosis not present

## 2017-01-05 DIAGNOSIS — F419 Anxiety disorder, unspecified: Secondary | ICD-10-CM | POA: Diagnosis not present

## 2017-01-05 DIAGNOSIS — J449 Chronic obstructive pulmonary disease, unspecified: Secondary | ICD-10-CM | POA: Diagnosis not present

## 2017-01-05 DIAGNOSIS — I5042 Chronic combined systolic (congestive) and diastolic (congestive) heart failure: Secondary | ICD-10-CM | POA: Diagnosis not present

## 2017-01-05 DIAGNOSIS — M545 Low back pain: Secondary | ICD-10-CM | POA: Diagnosis not present

## 2017-01-05 DIAGNOSIS — M6281 Muscle weakness (generalized): Secondary | ICD-10-CM | POA: Diagnosis not present

## 2017-01-06 DIAGNOSIS — J449 Chronic obstructive pulmonary disease, unspecified: Secondary | ICD-10-CM | POA: Diagnosis not present

## 2017-01-07 DIAGNOSIS — M1990 Unspecified osteoarthritis, unspecified site: Secondary | ICD-10-CM | POA: Diagnosis not present

## 2017-01-07 DIAGNOSIS — I5042 Chronic combined systolic (congestive) and diastolic (congestive) heart failure: Secondary | ICD-10-CM | POA: Diagnosis not present

## 2017-01-07 DIAGNOSIS — J449 Chronic obstructive pulmonary disease, unspecified: Secondary | ICD-10-CM | POA: Diagnosis not present

## 2017-01-07 DIAGNOSIS — G8929 Other chronic pain: Secondary | ICD-10-CM | POA: Diagnosis not present

## 2017-01-07 DIAGNOSIS — M542 Cervicalgia: Secondary | ICD-10-CM | POA: Diagnosis not present

## 2017-01-07 DIAGNOSIS — F419 Anxiety disorder, unspecified: Secondary | ICD-10-CM | POA: Diagnosis not present

## 2017-01-07 DIAGNOSIS — I110 Hypertensive heart disease with heart failure: Secondary | ICD-10-CM | POA: Diagnosis not present

## 2017-01-07 DIAGNOSIS — M6281 Muscle weakness (generalized): Secondary | ICD-10-CM | POA: Diagnosis not present

## 2017-01-07 DIAGNOSIS — M545 Low back pain: Secondary | ICD-10-CM | POA: Diagnosis not present

## 2017-01-08 DIAGNOSIS — J441 Chronic obstructive pulmonary disease with (acute) exacerbation: Secondary | ICD-10-CM | POA: Diagnosis not present

## 2017-01-14 DIAGNOSIS — M545 Low back pain: Secondary | ICD-10-CM | POA: Diagnosis not present

## 2017-01-14 DIAGNOSIS — I5042 Chronic combined systolic (congestive) and diastolic (congestive) heart failure: Secondary | ICD-10-CM | POA: Diagnosis not present

## 2017-01-14 DIAGNOSIS — G8929 Other chronic pain: Secondary | ICD-10-CM | POA: Diagnosis not present

## 2017-01-14 DIAGNOSIS — M1990 Unspecified osteoarthritis, unspecified site: Secondary | ICD-10-CM | POA: Diagnosis not present

## 2017-01-14 DIAGNOSIS — M6281 Muscle weakness (generalized): Secondary | ICD-10-CM | POA: Diagnosis not present

## 2017-01-14 DIAGNOSIS — I110 Hypertensive heart disease with heart failure: Secondary | ICD-10-CM | POA: Diagnosis not present

## 2017-01-14 DIAGNOSIS — J449 Chronic obstructive pulmonary disease, unspecified: Secondary | ICD-10-CM | POA: Diagnosis not present

## 2017-01-14 DIAGNOSIS — M542 Cervicalgia: Secondary | ICD-10-CM | POA: Diagnosis not present

## 2017-01-14 DIAGNOSIS — F419 Anxiety disorder, unspecified: Secondary | ICD-10-CM | POA: Diagnosis not present

## 2017-01-15 DIAGNOSIS — M6281 Muscle weakness (generalized): Secondary | ICD-10-CM | POA: Diagnosis not present

## 2017-01-15 DIAGNOSIS — F419 Anxiety disorder, unspecified: Secondary | ICD-10-CM | POA: Diagnosis not present

## 2017-01-15 DIAGNOSIS — I5042 Chronic combined systolic (congestive) and diastolic (congestive) heart failure: Secondary | ICD-10-CM | POA: Diagnosis not present

## 2017-01-15 DIAGNOSIS — J449 Chronic obstructive pulmonary disease, unspecified: Secondary | ICD-10-CM | POA: Diagnosis not present

## 2017-01-15 DIAGNOSIS — G8929 Other chronic pain: Secondary | ICD-10-CM | POA: Diagnosis not present

## 2017-01-15 DIAGNOSIS — M545 Low back pain: Secondary | ICD-10-CM | POA: Diagnosis not present

## 2017-01-15 DIAGNOSIS — I110 Hypertensive heart disease with heart failure: Secondary | ICD-10-CM | POA: Diagnosis not present

## 2017-01-15 DIAGNOSIS — M1990 Unspecified osteoarthritis, unspecified site: Secondary | ICD-10-CM | POA: Diagnosis not present

## 2017-01-15 DIAGNOSIS — M542 Cervicalgia: Secondary | ICD-10-CM | POA: Diagnosis not present

## 2017-01-18 DIAGNOSIS — M150 Primary generalized (osteo)arthritis: Secondary | ICD-10-CM | POA: Diagnosis not present

## 2017-01-18 DIAGNOSIS — F419 Anxiety disorder, unspecified: Secondary | ICD-10-CM | POA: Diagnosis not present

## 2017-01-18 DIAGNOSIS — F17210 Nicotine dependence, cigarettes, uncomplicated: Secondary | ICD-10-CM | POA: Diagnosis not present

## 2017-01-18 DIAGNOSIS — G8929 Other chronic pain: Secondary | ICD-10-CM | POA: Diagnosis not present

## 2017-01-18 DIAGNOSIS — I5022 Chronic systolic (congestive) heart failure: Secondary | ICD-10-CM | POA: Diagnosis not present

## 2017-01-18 DIAGNOSIS — J9611 Chronic respiratory failure with hypoxia: Secondary | ICD-10-CM | POA: Diagnosis not present

## 2017-01-18 DIAGNOSIS — M1990 Unspecified osteoarthritis, unspecified site: Secondary | ICD-10-CM | POA: Diagnosis not present

## 2017-01-18 DIAGNOSIS — Z9981 Dependence on supplemental oxygen: Secondary | ICD-10-CM | POA: Diagnosis not present

## 2017-01-18 DIAGNOSIS — R609 Edema, unspecified: Secondary | ICD-10-CM | POA: Diagnosis not present

## 2017-01-18 DIAGNOSIS — R251 Tremor, unspecified: Secondary | ICD-10-CM | POA: Diagnosis not present

## 2017-01-18 DIAGNOSIS — R531 Weakness: Secondary | ICD-10-CM | POA: Diagnosis not present

## 2017-01-18 DIAGNOSIS — I5042 Chronic combined systolic (congestive) and diastolic (congestive) heart failure: Secondary | ICD-10-CM | POA: Diagnosis not present

## 2017-01-18 DIAGNOSIS — M542 Cervicalgia: Secondary | ICD-10-CM | POA: Diagnosis not present

## 2017-01-18 DIAGNOSIS — M545 Low back pain: Secondary | ICD-10-CM | POA: Diagnosis not present

## 2017-01-18 DIAGNOSIS — M6281 Muscle weakness (generalized): Secondary | ICD-10-CM | POA: Diagnosis not present

## 2017-01-18 DIAGNOSIS — J449 Chronic obstructive pulmonary disease, unspecified: Secondary | ICD-10-CM | POA: Diagnosis not present

## 2017-01-18 DIAGNOSIS — I110 Hypertensive heart disease with heart failure: Secondary | ICD-10-CM | POA: Diagnosis not present

## 2017-01-21 DIAGNOSIS — I110 Hypertensive heart disease with heart failure: Secondary | ICD-10-CM | POA: Diagnosis not present

## 2017-01-21 DIAGNOSIS — M1990 Unspecified osteoarthritis, unspecified site: Secondary | ICD-10-CM | POA: Diagnosis not present

## 2017-01-21 DIAGNOSIS — I5042 Chronic combined systolic (congestive) and diastolic (congestive) heart failure: Secondary | ICD-10-CM | POA: Diagnosis not present

## 2017-01-21 DIAGNOSIS — M545 Low back pain: Secondary | ICD-10-CM | POA: Diagnosis not present

## 2017-01-21 DIAGNOSIS — M6281 Muscle weakness (generalized): Secondary | ICD-10-CM | POA: Diagnosis not present

## 2017-01-21 DIAGNOSIS — F419 Anxiety disorder, unspecified: Secondary | ICD-10-CM | POA: Diagnosis not present

## 2017-01-21 DIAGNOSIS — J449 Chronic obstructive pulmonary disease, unspecified: Secondary | ICD-10-CM | POA: Diagnosis not present

## 2017-01-21 DIAGNOSIS — M542 Cervicalgia: Secondary | ICD-10-CM | POA: Diagnosis not present

## 2017-01-21 DIAGNOSIS — G8929 Other chronic pain: Secondary | ICD-10-CM | POA: Diagnosis not present

## 2017-01-22 DIAGNOSIS — J441 Chronic obstructive pulmonary disease with (acute) exacerbation: Secondary | ICD-10-CM | POA: Diagnosis not present

## 2017-01-24 DIAGNOSIS — R269 Unspecified abnormalities of gait and mobility: Secondary | ICD-10-CM | POA: Diagnosis not present

## 2017-01-24 DIAGNOSIS — I5020 Unspecified systolic (congestive) heart failure: Secondary | ICD-10-CM | POA: Diagnosis not present

## 2017-01-24 DIAGNOSIS — J441 Chronic obstructive pulmonary disease with (acute) exacerbation: Secondary | ICD-10-CM | POA: Diagnosis not present

## 2017-01-24 DIAGNOSIS — J9690 Respiratory failure, unspecified, unspecified whether with hypoxia or hypercapnia: Secondary | ICD-10-CM | POA: Diagnosis not present

## 2017-01-24 DIAGNOSIS — R0602 Shortness of breath: Secondary | ICD-10-CM | POA: Diagnosis not present

## 2017-01-27 DIAGNOSIS — M545 Low back pain: Secondary | ICD-10-CM | POA: Diagnosis not present

## 2017-01-27 DIAGNOSIS — M1990 Unspecified osteoarthritis, unspecified site: Secondary | ICD-10-CM | POA: Diagnosis not present

## 2017-01-27 DIAGNOSIS — F419 Anxiety disorder, unspecified: Secondary | ICD-10-CM | POA: Diagnosis not present

## 2017-01-27 DIAGNOSIS — M6281 Muscle weakness (generalized): Secondary | ICD-10-CM | POA: Diagnosis not present

## 2017-01-27 DIAGNOSIS — I5042 Chronic combined systolic (congestive) and diastolic (congestive) heart failure: Secondary | ICD-10-CM | POA: Diagnosis not present

## 2017-01-27 DIAGNOSIS — G8929 Other chronic pain: Secondary | ICD-10-CM | POA: Diagnosis not present

## 2017-01-27 DIAGNOSIS — J449 Chronic obstructive pulmonary disease, unspecified: Secondary | ICD-10-CM | POA: Diagnosis not present

## 2017-01-27 DIAGNOSIS — I110 Hypertensive heart disease with heart failure: Secondary | ICD-10-CM | POA: Diagnosis not present

## 2017-01-27 DIAGNOSIS — M542 Cervicalgia: Secondary | ICD-10-CM | POA: Diagnosis not present

## 2017-01-29 DIAGNOSIS — M545 Low back pain: Secondary | ICD-10-CM | POA: Diagnosis not present

## 2017-01-29 DIAGNOSIS — F419 Anxiety disorder, unspecified: Secondary | ICD-10-CM | POA: Diagnosis not present

## 2017-01-29 DIAGNOSIS — G8929 Other chronic pain: Secondary | ICD-10-CM | POA: Diagnosis not present

## 2017-01-29 DIAGNOSIS — J449 Chronic obstructive pulmonary disease, unspecified: Secondary | ICD-10-CM | POA: Diagnosis not present

## 2017-01-29 DIAGNOSIS — I5042 Chronic combined systolic (congestive) and diastolic (congestive) heart failure: Secondary | ICD-10-CM | POA: Diagnosis not present

## 2017-01-29 DIAGNOSIS — I110 Hypertensive heart disease with heart failure: Secondary | ICD-10-CM | POA: Diagnosis not present

## 2017-01-29 DIAGNOSIS — M542 Cervicalgia: Secondary | ICD-10-CM | POA: Diagnosis not present

## 2017-01-29 DIAGNOSIS — M1990 Unspecified osteoarthritis, unspecified site: Secondary | ICD-10-CM | POA: Diagnosis not present

## 2017-01-29 DIAGNOSIS — M6281 Muscle weakness (generalized): Secondary | ICD-10-CM | POA: Diagnosis not present

## 2017-02-01 DIAGNOSIS — M542 Cervicalgia: Secondary | ICD-10-CM | POA: Diagnosis not present

## 2017-02-01 DIAGNOSIS — G8929 Other chronic pain: Secondary | ICD-10-CM | POA: Diagnosis not present

## 2017-02-01 DIAGNOSIS — R32 Unspecified urinary incontinence: Secondary | ICD-10-CM | POA: Diagnosis not present

## 2017-02-01 DIAGNOSIS — I110 Hypertensive heart disease with heart failure: Secondary | ICD-10-CM | POA: Diagnosis not present

## 2017-02-01 DIAGNOSIS — M6281 Muscle weakness (generalized): Secondary | ICD-10-CM | POA: Diagnosis not present

## 2017-02-01 DIAGNOSIS — M545 Low back pain: Secondary | ICD-10-CM | POA: Diagnosis not present

## 2017-02-01 DIAGNOSIS — M1990 Unspecified osteoarthritis, unspecified site: Secondary | ICD-10-CM | POA: Diagnosis not present

## 2017-02-01 DIAGNOSIS — I5042 Chronic combined systolic (congestive) and diastolic (congestive) heart failure: Secondary | ICD-10-CM | POA: Diagnosis not present

## 2017-02-01 DIAGNOSIS — J449 Chronic obstructive pulmonary disease, unspecified: Secondary | ICD-10-CM | POA: Diagnosis not present

## 2017-02-01 DIAGNOSIS — F419 Anxiety disorder, unspecified: Secondary | ICD-10-CM | POA: Diagnosis not present

## 2017-02-03 DIAGNOSIS — G8929 Other chronic pain: Secondary | ICD-10-CM | POA: Diagnosis not present

## 2017-02-03 DIAGNOSIS — I110 Hypertensive heart disease with heart failure: Secondary | ICD-10-CM | POA: Diagnosis not present

## 2017-02-03 DIAGNOSIS — M6281 Muscle weakness (generalized): Secondary | ICD-10-CM | POA: Diagnosis not present

## 2017-02-03 DIAGNOSIS — J449 Chronic obstructive pulmonary disease, unspecified: Secondary | ICD-10-CM | POA: Diagnosis not present

## 2017-02-03 DIAGNOSIS — M1990 Unspecified osteoarthritis, unspecified site: Secondary | ICD-10-CM | POA: Diagnosis not present

## 2017-02-03 DIAGNOSIS — F419 Anxiety disorder, unspecified: Secondary | ICD-10-CM | POA: Diagnosis not present

## 2017-02-03 DIAGNOSIS — I5042 Chronic combined systolic (congestive) and diastolic (congestive) heart failure: Secondary | ICD-10-CM | POA: Diagnosis not present

## 2017-02-03 DIAGNOSIS — M542 Cervicalgia: Secondary | ICD-10-CM | POA: Diagnosis not present

## 2017-02-03 DIAGNOSIS — M545 Low back pain: Secondary | ICD-10-CM | POA: Diagnosis not present

## 2017-02-05 DIAGNOSIS — J449 Chronic obstructive pulmonary disease, unspecified: Secondary | ICD-10-CM | POA: Diagnosis not present

## 2017-02-07 DIAGNOSIS — J441 Chronic obstructive pulmonary disease with (acute) exacerbation: Secondary | ICD-10-CM | POA: Diagnosis not present

## 2017-02-08 DIAGNOSIS — J449 Chronic obstructive pulmonary disease, unspecified: Secondary | ICD-10-CM | POA: Diagnosis not present

## 2017-02-08 DIAGNOSIS — I5042 Chronic combined systolic (congestive) and diastolic (congestive) heart failure: Secondary | ICD-10-CM | POA: Diagnosis not present

## 2017-02-08 DIAGNOSIS — M6281 Muscle weakness (generalized): Secondary | ICD-10-CM | POA: Diagnosis not present

## 2017-02-08 DIAGNOSIS — F419 Anxiety disorder, unspecified: Secondary | ICD-10-CM | POA: Diagnosis not present

## 2017-02-08 DIAGNOSIS — I110 Hypertensive heart disease with heart failure: Secondary | ICD-10-CM | POA: Diagnosis not present

## 2017-02-08 DIAGNOSIS — M1990 Unspecified osteoarthritis, unspecified site: Secondary | ICD-10-CM | POA: Diagnosis not present

## 2017-02-08 DIAGNOSIS — M542 Cervicalgia: Secondary | ICD-10-CM | POA: Diagnosis not present

## 2017-02-08 DIAGNOSIS — G8929 Other chronic pain: Secondary | ICD-10-CM | POA: Diagnosis not present

## 2017-02-08 DIAGNOSIS — M545 Low back pain: Secondary | ICD-10-CM | POA: Diagnosis not present

## 2017-02-10 DIAGNOSIS — M545 Low back pain: Secondary | ICD-10-CM | POA: Diagnosis not present

## 2017-02-10 DIAGNOSIS — M1990 Unspecified osteoarthritis, unspecified site: Secondary | ICD-10-CM | POA: Diagnosis not present

## 2017-02-10 DIAGNOSIS — F419 Anxiety disorder, unspecified: Secondary | ICD-10-CM | POA: Diagnosis not present

## 2017-02-10 DIAGNOSIS — I5042 Chronic combined systolic (congestive) and diastolic (congestive) heart failure: Secondary | ICD-10-CM | POA: Diagnosis not present

## 2017-02-10 DIAGNOSIS — M6281 Muscle weakness (generalized): Secondary | ICD-10-CM | POA: Diagnosis not present

## 2017-02-10 DIAGNOSIS — J449 Chronic obstructive pulmonary disease, unspecified: Secondary | ICD-10-CM | POA: Diagnosis not present

## 2017-02-10 DIAGNOSIS — M542 Cervicalgia: Secondary | ICD-10-CM | POA: Diagnosis not present

## 2017-02-10 DIAGNOSIS — I110 Hypertensive heart disease with heart failure: Secondary | ICD-10-CM | POA: Diagnosis not present

## 2017-02-10 DIAGNOSIS — G8929 Other chronic pain: Secondary | ICD-10-CM | POA: Diagnosis not present

## 2017-02-11 DIAGNOSIS — R32 Unspecified urinary incontinence: Secondary | ICD-10-CM | POA: Diagnosis not present

## 2017-02-15 DIAGNOSIS — I110 Hypertensive heart disease with heart failure: Secondary | ICD-10-CM | POA: Diagnosis not present

## 2017-02-15 DIAGNOSIS — M542 Cervicalgia: Secondary | ICD-10-CM | POA: Diagnosis not present

## 2017-02-15 DIAGNOSIS — M1990 Unspecified osteoarthritis, unspecified site: Secondary | ICD-10-CM | POA: Diagnosis not present

## 2017-02-15 DIAGNOSIS — G8929 Other chronic pain: Secondary | ICD-10-CM | POA: Diagnosis not present

## 2017-02-15 DIAGNOSIS — M6281 Muscle weakness (generalized): Secondary | ICD-10-CM | POA: Diagnosis not present

## 2017-02-15 DIAGNOSIS — I5042 Chronic combined systolic (congestive) and diastolic (congestive) heart failure: Secondary | ICD-10-CM | POA: Diagnosis not present

## 2017-02-15 DIAGNOSIS — J449 Chronic obstructive pulmonary disease, unspecified: Secondary | ICD-10-CM | POA: Diagnosis not present

## 2017-02-15 DIAGNOSIS — F419 Anxiety disorder, unspecified: Secondary | ICD-10-CM | POA: Diagnosis not present

## 2017-02-15 DIAGNOSIS — M545 Low back pain: Secondary | ICD-10-CM | POA: Diagnosis not present

## 2017-02-19 DIAGNOSIS — I110 Hypertensive heart disease with heart failure: Secondary | ICD-10-CM | POA: Diagnosis not present

## 2017-02-19 DIAGNOSIS — M6281 Muscle weakness (generalized): Secondary | ICD-10-CM | POA: Diagnosis not present

## 2017-02-19 DIAGNOSIS — M1990 Unspecified osteoarthritis, unspecified site: Secondary | ICD-10-CM | POA: Diagnosis not present

## 2017-02-19 DIAGNOSIS — F419 Anxiety disorder, unspecified: Secondary | ICD-10-CM | POA: Diagnosis not present

## 2017-02-19 DIAGNOSIS — J449 Chronic obstructive pulmonary disease, unspecified: Secondary | ICD-10-CM | POA: Diagnosis not present

## 2017-02-19 DIAGNOSIS — M542 Cervicalgia: Secondary | ICD-10-CM | POA: Diagnosis not present

## 2017-02-19 DIAGNOSIS — G8929 Other chronic pain: Secondary | ICD-10-CM | POA: Diagnosis not present

## 2017-02-19 DIAGNOSIS — M545 Low back pain: Secondary | ICD-10-CM | POA: Diagnosis not present

## 2017-02-19 DIAGNOSIS — I5042 Chronic combined systolic (congestive) and diastolic (congestive) heart failure: Secondary | ICD-10-CM | POA: Diagnosis not present

## 2017-02-22 DIAGNOSIS — J441 Chronic obstructive pulmonary disease with (acute) exacerbation: Secondary | ICD-10-CM | POA: Diagnosis not present

## 2017-02-24 DIAGNOSIS — R0602 Shortness of breath: Secondary | ICD-10-CM | POA: Diagnosis not present

## 2017-02-24 DIAGNOSIS — J9690 Respiratory failure, unspecified, unspecified whether with hypoxia or hypercapnia: Secondary | ICD-10-CM | POA: Diagnosis not present

## 2017-02-24 DIAGNOSIS — I5020 Unspecified systolic (congestive) heart failure: Secondary | ICD-10-CM | POA: Diagnosis not present

## 2017-02-24 DIAGNOSIS — J441 Chronic obstructive pulmonary disease with (acute) exacerbation: Secondary | ICD-10-CM | POA: Diagnosis not present

## 2017-02-24 DIAGNOSIS — R269 Unspecified abnormalities of gait and mobility: Secondary | ICD-10-CM | POA: Diagnosis not present

## 2017-03-01 DIAGNOSIS — M154 Erosive (osteo)arthritis: Secondary | ICD-10-CM | POA: Diagnosis not present

## 2017-03-01 DIAGNOSIS — F1120 Opioid dependence, uncomplicated: Secondary | ICD-10-CM | POA: Diagnosis not present

## 2017-03-01 DIAGNOSIS — G8929 Other chronic pain: Secondary | ICD-10-CM | POA: Diagnosis not present

## 2017-03-01 DIAGNOSIS — Z79899 Other long term (current) drug therapy: Secondary | ICD-10-CM | POA: Diagnosis not present

## 2017-03-01 DIAGNOSIS — G8921 Chronic pain due to trauma: Secondary | ICD-10-CM | POA: Diagnosis not present

## 2017-03-01 DIAGNOSIS — M545 Low back pain: Secondary | ICD-10-CM | POA: Diagnosis not present

## 2017-03-01 DIAGNOSIS — M542 Cervicalgia: Secondary | ICD-10-CM | POA: Diagnosis not present

## 2017-03-01 DIAGNOSIS — M150 Primary generalized (osteo)arthritis: Secondary | ICD-10-CM | POA: Diagnosis not present

## 2017-03-01 DIAGNOSIS — J449 Chronic obstructive pulmonary disease, unspecified: Secondary | ICD-10-CM | POA: Diagnosis not present

## 2017-03-08 DIAGNOSIS — J449 Chronic obstructive pulmonary disease, unspecified: Secondary | ICD-10-CM | POA: Diagnosis not present

## 2017-03-10 DIAGNOSIS — J441 Chronic obstructive pulmonary disease with (acute) exacerbation: Secondary | ICD-10-CM | POA: Diagnosis not present

## 2017-03-15 DIAGNOSIS — R32 Unspecified urinary incontinence: Secondary | ICD-10-CM | POA: Diagnosis not present

## 2017-03-15 DIAGNOSIS — G463 Brain stem stroke syndrome: Secondary | ICD-10-CM | POA: Diagnosis not present

## 2017-03-24 DIAGNOSIS — J441 Chronic obstructive pulmonary disease with (acute) exacerbation: Secondary | ICD-10-CM | POA: Diagnosis not present

## 2017-03-26 DIAGNOSIS — R269 Unspecified abnormalities of gait and mobility: Secondary | ICD-10-CM | POA: Diagnosis not present

## 2017-03-26 DIAGNOSIS — I5020 Unspecified systolic (congestive) heart failure: Secondary | ICD-10-CM | POA: Diagnosis not present

## 2017-03-26 DIAGNOSIS — J9690 Respiratory failure, unspecified, unspecified whether with hypoxia or hypercapnia: Secondary | ICD-10-CM | POA: Diagnosis not present

## 2017-03-26 DIAGNOSIS — J441 Chronic obstructive pulmonary disease with (acute) exacerbation: Secondary | ICD-10-CM | POA: Diagnosis not present

## 2017-03-26 DIAGNOSIS — R0602 Shortness of breath: Secondary | ICD-10-CM | POA: Diagnosis not present

## 2017-04-07 DIAGNOSIS — J449 Chronic obstructive pulmonary disease, unspecified: Secondary | ICD-10-CM | POA: Diagnosis not present

## 2017-04-09 DIAGNOSIS — J441 Chronic obstructive pulmonary disease with (acute) exacerbation: Secondary | ICD-10-CM | POA: Diagnosis not present

## 2017-04-15 DIAGNOSIS — R32 Unspecified urinary incontinence: Secondary | ICD-10-CM | POA: Diagnosis not present

## 2017-04-19 ENCOUNTER — Ambulatory Visit: Payer: Self-pay | Admitting: Nurse Practitioner

## 2017-04-19 ENCOUNTER — Other Ambulatory Visit: Payer: Self-pay

## 2017-04-19 DIAGNOSIS — R05 Cough: Secondary | ICD-10-CM | POA: Insufficient documentation

## 2017-04-19 DIAGNOSIS — J439 Emphysema, unspecified: Secondary | ICD-10-CM | POA: Insufficient documentation

## 2017-04-19 DIAGNOSIS — E782 Mixed hyperlipidemia: Secondary | ICD-10-CM | POA: Insufficient documentation

## 2017-04-19 DIAGNOSIS — I5022 Chronic systolic (congestive) heart failure: Secondary | ICD-10-CM | POA: Insufficient documentation

## 2017-04-19 DIAGNOSIS — R059 Cough, unspecified: Secondary | ICD-10-CM | POA: Insufficient documentation

## 2017-04-19 DIAGNOSIS — I2781 Cor pulmonale (chronic): Secondary | ICD-10-CM | POA: Insufficient documentation

## 2017-04-19 DIAGNOSIS — R609 Edema, unspecified: Secondary | ICD-10-CM | POA: Insufficient documentation

## 2017-04-19 DIAGNOSIS — R300 Dysuria: Secondary | ICD-10-CM | POA: Insufficient documentation

## 2017-04-19 DIAGNOSIS — R0602 Shortness of breath: Secondary | ICD-10-CM | POA: Insufficient documentation

## 2017-04-19 DIAGNOSIS — R531 Weakness: Secondary | ICD-10-CM | POA: Insufficient documentation

## 2017-04-19 DIAGNOSIS — J309 Allergic rhinitis, unspecified: Secondary | ICD-10-CM | POA: Insufficient documentation

## 2017-04-19 DIAGNOSIS — F411 Generalized anxiety disorder: Secondary | ICD-10-CM | POA: Insufficient documentation

## 2017-04-19 DIAGNOSIS — G894 Chronic pain syndrome: Secondary | ICD-10-CM | POA: Insufficient documentation

## 2017-04-19 DIAGNOSIS — F17211 Nicotine dependence, cigarettes, in remission: Secondary | ICD-10-CM | POA: Insufficient documentation

## 2017-04-19 DIAGNOSIS — R262 Difficulty in walking, not elsewhere classified: Secondary | ICD-10-CM | POA: Insufficient documentation

## 2017-04-19 DIAGNOSIS — Z9981 Dependence on supplemental oxygen: Secondary | ICD-10-CM | POA: Insufficient documentation

## 2017-04-19 DIAGNOSIS — R251 Tremor, unspecified: Secondary | ICD-10-CM | POA: Insufficient documentation

## 2017-04-19 DIAGNOSIS — I6523 Occlusion and stenosis of bilateral carotid arteries: Secondary | ICD-10-CM | POA: Insufficient documentation

## 2017-04-19 DIAGNOSIS — M150 Primary generalized (osteo)arthritis: Secondary | ICD-10-CM | POA: Insufficient documentation

## 2017-04-24 DIAGNOSIS — J441 Chronic obstructive pulmonary disease with (acute) exacerbation: Secondary | ICD-10-CM | POA: Diagnosis not present

## 2017-04-26 DIAGNOSIS — R0602 Shortness of breath: Secondary | ICD-10-CM | POA: Diagnosis not present

## 2017-04-26 DIAGNOSIS — R269 Unspecified abnormalities of gait and mobility: Secondary | ICD-10-CM | POA: Diagnosis not present

## 2017-04-26 DIAGNOSIS — J9690 Respiratory failure, unspecified, unspecified whether with hypoxia or hypercapnia: Secondary | ICD-10-CM | POA: Diagnosis not present

## 2017-04-26 DIAGNOSIS — I5020 Unspecified systolic (congestive) heart failure: Secondary | ICD-10-CM | POA: Diagnosis not present

## 2017-04-26 DIAGNOSIS — J441 Chronic obstructive pulmonary disease with (acute) exacerbation: Secondary | ICD-10-CM | POA: Diagnosis not present

## 2017-05-04 ENCOUNTER — Other Ambulatory Visit: Payer: Self-pay

## 2017-05-04 MED ORDER — FLUTICASONE PROPIONATE 50 MCG/ACT NA SUSP: 2.0000 | Freq: Every day | NASAL | 3 refills | Status: DC

## 2017-05-04 MED ORDER — SYMBICORT 160-4.5 MCG/ACT IN AERO: 1.0000 | INHALATION_SPRAY | Freq: Every day | RESPIRATORY_TRACT | 3 refills | Status: DC

## 2017-05-04 MED ORDER — ALBUTEROL SULFATE HFA 108 (90 BASE) MCG/ACT IN AERS: 1.0000 | INHALATION_SPRAY | Freq: Four times a day (QID) | RESPIRATORY_TRACT | 3 refills | Status: DC | PRN

## 2017-05-08 DIAGNOSIS — J449 Chronic obstructive pulmonary disease, unspecified: Secondary | ICD-10-CM | POA: Diagnosis not present

## 2017-05-10 DIAGNOSIS — J441 Chronic obstructive pulmonary disease with (acute) exacerbation: Secondary | ICD-10-CM | POA: Diagnosis not present

## 2017-05-12 ENCOUNTER — Other Ambulatory Visit: Payer: Self-pay

## 2017-05-12 MED ORDER — FUROSEMIDE 20 MG PO TABS: 20.0000 mg | ORAL_TABLET | Freq: Every day | ORAL | 3 refills | Status: DC

## 2017-05-16 DIAGNOSIS — R32 Unspecified urinary incontinence: Secondary | ICD-10-CM | POA: Diagnosis not present

## 2017-05-19 ENCOUNTER — Encounter: Payer: Self-pay | Admitting: Internal Medicine

## 2017-05-19 ENCOUNTER — Ambulatory Visit (INDEPENDENT_AMBULATORY_CARE_PROVIDER_SITE_OTHER): Payer: Medicare HMO | Admitting: Internal Medicine

## 2017-05-19 VITALS — BP 145/73 | HR 81 | Resp 16 | Ht 70.0 in | Wt 163.6 lb

## 2017-05-19 DIAGNOSIS — I50812 Chronic right heart failure: Secondary | ICD-10-CM | POA: Diagnosis not present

## 2017-05-19 DIAGNOSIS — Z9981 Dependence on supplemental oxygen: Secondary | ICD-10-CM

## 2017-05-19 DIAGNOSIS — J439 Emphysema, unspecified: Secondary | ICD-10-CM | POA: Diagnosis not present

## 2017-05-19 DIAGNOSIS — I50813 Acute on chronic right heart failure: Secondary | ICD-10-CM

## 2017-05-19 MED ORDER — FUROSEMIDE 20 MG PO TABS: ORAL_TABLET | ORAL | 6 refills | Status: DC

## 2017-05-19 NOTE — Progress Notes (Signed)
[facility] [address] [place], [place] [address]  Internal MEDICINE  Office Visit Note  Patient Name: Scott Richard  [id number]  [id number]  Date of Service: 06/04/2017  Chief Complaint  Patient presents with  . OTHER    lower ext edema     HPI  Pt is here for routine follow up. He was in the hospital for COPD. He was given a diuretics for few days. Anxiety with COPD/ O2   Current Medication: Outpatient Encounter Medications as of 05/19/2017  Medication Sig  . albuterol (PROVENTIL HFA;VENTOLIN HFA) 108 (90 Base) MCG/ACT inhaler Inhale 1-2 puffs into the lungs every 6 (six) hours as needed for wheezing or shortness of breath.  . Ascorbic Acid (VITAMIN C) 500 MG CAPS Take by mouth daily.  . cetirizine (ZYRTEC) 10 MG tablet Take 10 mg by mouth daily.  . Cholecalciferol (VITAMIN D-1000 MAX ST) 1000 units tablet Take 1 tablet by mouth daily.  . fluticasone (FLONASE) 50 MCG/ACT nasal spray Place 2 sprays into both nostrils daily.  . furosemide (LASIX) 20 MG tablet Take 2 tabs for 3 days and then once a day  . guaiFENesin (MUCINEX) 600 MG 12 hr tablet Take 600 mg by mouth 2 (two) times daily.  Marland Kitchen ipratropium-albuterol (DUONEB) 0.5-2.5 (3) MG/3ML SOLN Take 3 mLs by nebulization every 6 (six) hours as needed (wheezing).  . Multiple Vitamin (MULTIVITAMIN) capsule Take by mouth.  . oxyCODONE (OXY IR/ROXICODONE) 5 MG immediate release tablet Take 1 tablet (5 mg total) by mouth every 6 (six) hours as needed for moderate pain.  . pramipexole (MIRAPEX) 0.5 MG tablet Take 1 tablet (0.5 mg total) by mouth 2 (two) times daily. (Patient taking differently: Take 0.5 mg by mouth 3 (three) times daily. )  . predniSONE (DELTASONE) 20 MG tablet Take 2 tablets (40 mg total) by mouth daily.  . propranolol (INDERAL) 10 MG tablet Take 1 tablet (10 mg total) by mouth 3 (three) times daily.  . SYMBICORT 160-4.5 MCG/ACT inhaler Inhale 1 puff into the lungs daily.  . [DISCONTINUED] albuterol  (VENTOLIN HFA) 108 (90 Base) MCG/ACT inhaler   . [DISCONTINUED] furosemide (LASIX) 20 MG tablet Take 1 tablet (20 mg total) by mouth daily.  . meloxicam (MOBIC) 7.5 MG tablet Take 1 tablet by mouth 2 (two) times daily as needed.   . [DISCONTINUED] LORazepam (ATIVAN) 0.5 MG tablet Take 1 tablet by mouth daily.  . [DISCONTINUED] Multiple Vitamin (MULTIVITAMIN WITH MINERALS) TABS tablet Take 1 tablet by mouth daily.  . [DISCONTINUED] Multiple Vitamins-Minerals (MULTIVITAMIN ADULT PO) Take by mouth daily.   No facility-administered encounter medications on file as of 05/19/2017.     Surgical History: Past Surgical History:  Procedure Laterality Date  . NECK SURGERY    . TONSILLECTOMY      Medical History: Past Medical History:  Diagnosis Date  . Arthritis   . CHF (congestive heart failure) (HCC)   . COPD (chronic obstructive pulmonary disease) (HCC)   . Hypertension     Family History: Family History  Problem Relation Age of Onset  . Prostate cancer Neg Hx   . Bladder Cancer Neg Hx   . Kidney cancer Neg Hx     Social History   Socioeconomic History  . Marital status: Divorced    Spouse name: Not on file  . Number of children: Not on file  . Years of education: Not on file  . Highest education level: Not on file  Social Needs  . Physicist, medical  strain: Not on file  . Food insecurity - worry: Not on file  . Food insecurity - inability: Not on file  . Transportation needs - medical: Not on file  . Transportation needs - non-medical: Not on file  Occupational History  . Not on file  Tobacco Use  . Smoking status: Former Smoker    Last attempt to quit: 2012    Years since quitting: 7.1  . Smokeless tobacco: Never Used  Substance and Sexual Activity  . Alcohol use: No  . Drug use: No  . Sexual activity: Not on file  Other Topics Concern  . Not on file  Social History Narrative  . Not on file    Review of Systems  Constitutional: Negative for chills, fatigue  and unexpected weight change.  HENT: Positive for postnasal drip. Negative for congestion, rhinorrhea, sneezing and sore throat.   Eyes: Negative for redness.  Respiratory: Negative for cough, chest tightness and shortness of breath.   Cardiovascular: Negative for chest pain and palpitations.  Gastrointestinal: Negative for abdominal pain, constipation, diarrhea, nausea and vomiting.  Genitourinary: Negative for dysuria and frequency.  Musculoskeletal: Negative for arthralgias, back pain, joint swelling and neck pain.  Skin: Negative for rash.  Neurological: Negative.  Negative for tremors and numbness.  Hematological: Negative for adenopathy. Does not bruise/bleed easily.  Psychiatric/Behavioral: Negative for behavioral problems (Depression), sleep disturbance and suicidal ideas. The patient is not nervous/anxious.     Vital Signs: BP (!) 145/73 (BP Location: Left Arm, Patient Position: Sitting)   Pulse 81   Resp 16   Ht 5\' 10"  (1.778 m)   Wt 163 lb 9.6 oz (74.2 kg)   BMI 23.47 kg/m    Physical Exam  Constitutional: He is oriented to person, place, and time. He appears well-developed and well-nourished. No distress.  HENT:  Head: Normocephalic and atraumatic.  Mouth/Throat: Oropharynx is clear and moist. No oropharyngeal exudate.  Eyes: EOM are normal. Pupils are equal, round, and reactive to light.  Neck: Normal range of motion. Neck supple.  Cardiovascular: Normal rate, regular rhythm and normal heart sounds. Exam reveals no gallop and no friction rub.  No murmur heard. Pulmonary/Chest: Effort normal. No respiratory distress. He has no wheezes. He has rales. He exhibits no tenderness.  Abdominal: Soft. Bowel sounds are normal.  Musculoskeletal: Normal range of motion. He exhibits edema.  Neurological: He is alert and oriented to person, place, and time. No cranial nerve deficit.  Skin: Skin is warm and dry. He is not diaphoretic.  Psychiatric: He has a normal mood and affect.  His behavior is normal. Judgment and thought content normal.   Assessment/Plan: 1. Chronic right-sided heart failure (HCC) - Start Lasix 20 mg po qd and then prn - Basic metabolic panel  2. Acute on chronic right-sided congestive heart failure (HCC) - Continue as above   3. Pulmonary emphysema, unspecified emphysema type (HCC) - F/U with Pulmonary  4. Dependence on supplemental oxygen - Continue on O2   General Counseling: Scott Richard verbalizes understanding of the findings of todays visit and agrees with plan of treatment. I have discussed any further diagnostic evaluation that may be needed or ordered today. We also reviewed his medications today. he has been encouraged to call the office with any questions or concerns that should arise related to todays visit.   Orders Placed This Encounter  Procedures  . Basic metabolic panel    Meds ordered this encounter  Medications  . furosemide (LASIX) 20 MG tablet  Sig: Take 2 tabs for 3 days and then once a day    Dispense:  45 tablet    Refill:  6    Time spent: 25 Minutes   Dr Lyndon Code Internal medicine

## 2017-05-27 DIAGNOSIS — J441 Chronic obstructive pulmonary disease with (acute) exacerbation: Secondary | ICD-10-CM | POA: Diagnosis not present

## 2017-05-27 DIAGNOSIS — J9690 Respiratory failure, unspecified, unspecified whether with hypoxia or hypercapnia: Secondary | ICD-10-CM | POA: Diagnosis not present

## 2017-05-27 DIAGNOSIS — R269 Unspecified abnormalities of gait and mobility: Secondary | ICD-10-CM | POA: Diagnosis not present

## 2017-05-27 DIAGNOSIS — R0602 Shortness of breath: Secondary | ICD-10-CM | POA: Diagnosis not present

## 2017-05-27 DIAGNOSIS — I5020 Unspecified systolic (congestive) heart failure: Secondary | ICD-10-CM | POA: Diagnosis not present

## 2017-05-31 DIAGNOSIS — F1120 Opioid dependence, uncomplicated: Secondary | ICD-10-CM | POA: Diagnosis not present

## 2017-05-31 DIAGNOSIS — M154 Erosive (osteo)arthritis: Secondary | ICD-10-CM | POA: Diagnosis not present

## 2017-05-31 DIAGNOSIS — M542 Cervicalgia: Secondary | ICD-10-CM | POA: Diagnosis not present

## 2017-05-31 DIAGNOSIS — Z79899 Other long term (current) drug therapy: Secondary | ICD-10-CM | POA: Diagnosis not present

## 2017-05-31 DIAGNOSIS — G8929 Other chronic pain: Secondary | ICD-10-CM | POA: Diagnosis not present

## 2017-05-31 DIAGNOSIS — M545 Low back pain: Secondary | ICD-10-CM | POA: Diagnosis not present

## 2017-05-31 DIAGNOSIS — G8921 Chronic pain due to trauma: Secondary | ICD-10-CM | POA: Diagnosis not present

## 2017-05-31 DIAGNOSIS — M150 Primary generalized (osteo)arthritis: Secondary | ICD-10-CM | POA: Diagnosis not present

## 2017-05-31 DIAGNOSIS — J449 Chronic obstructive pulmonary disease, unspecified: Secondary | ICD-10-CM | POA: Diagnosis not present

## 2017-06-08 DIAGNOSIS — J449 Chronic obstructive pulmonary disease, unspecified: Secondary | ICD-10-CM | POA: Diagnosis not present

## 2017-06-10 DIAGNOSIS — J441 Chronic obstructive pulmonary disease with (acute) exacerbation: Secondary | ICD-10-CM | POA: Diagnosis not present

## 2017-06-11 DIAGNOSIS — R32 Unspecified urinary incontinence: Secondary | ICD-10-CM | POA: Diagnosis not present

## 2017-06-16 ENCOUNTER — Ambulatory Visit: Payer: Self-pay | Admitting: Internal Medicine

## 2017-06-24 DIAGNOSIS — R269 Unspecified abnormalities of gait and mobility: Secondary | ICD-10-CM | POA: Diagnosis not present

## 2017-06-24 DIAGNOSIS — J9690 Respiratory failure, unspecified, unspecified whether with hypoxia or hypercapnia: Secondary | ICD-10-CM | POA: Diagnosis not present

## 2017-06-24 DIAGNOSIS — R0602 Shortness of breath: Secondary | ICD-10-CM | POA: Diagnosis not present

## 2017-06-24 DIAGNOSIS — I5020 Unspecified systolic (congestive) heart failure: Secondary | ICD-10-CM | POA: Diagnosis not present

## 2017-06-24 DIAGNOSIS — J441 Chronic obstructive pulmonary disease with (acute) exacerbation: Secondary | ICD-10-CM | POA: Diagnosis not present

## 2017-07-06 DIAGNOSIS — J449 Chronic obstructive pulmonary disease, unspecified: Secondary | ICD-10-CM | POA: Diagnosis not present

## 2017-07-08 DIAGNOSIS — J441 Chronic obstructive pulmonary disease with (acute) exacerbation: Secondary | ICD-10-CM | POA: Diagnosis not present

## 2017-07-09 ENCOUNTER — Other Ambulatory Visit: Payer: Self-pay

## 2017-07-09 MED ORDER — PRAMIPEXOLE DIHYDROCHLORIDE 0.5 MG PO TABS: 0.5000 mg | ORAL_TABLET | Freq: Three times a day (TID) | ORAL | 1 refills | Status: DC

## 2017-07-09 MED ORDER — FLUTICASONE PROPIONATE 50 MCG/ACT NA SUSP: 2.0000 | Freq: Every day | NASAL | 3 refills | Status: DC

## 2017-07-12 DIAGNOSIS — R32 Unspecified urinary incontinence: Secondary | ICD-10-CM | POA: Diagnosis not present

## 2017-07-13 ENCOUNTER — Ambulatory Visit: Payer: Self-pay | Admitting: Internal Medicine

## 2017-07-21 ENCOUNTER — Ambulatory Visit: Payer: Self-pay | Admitting: Internal Medicine

## 2017-07-25 DIAGNOSIS — J441 Chronic obstructive pulmonary disease with (acute) exacerbation: Secondary | ICD-10-CM | POA: Diagnosis not present

## 2017-07-25 DIAGNOSIS — J9690 Respiratory failure, unspecified, unspecified whether with hypoxia or hypercapnia: Secondary | ICD-10-CM | POA: Diagnosis not present

## 2017-07-25 DIAGNOSIS — R0602 Shortness of breath: Secondary | ICD-10-CM | POA: Diagnosis not present

## 2017-07-25 DIAGNOSIS — R269 Unspecified abnormalities of gait and mobility: Secondary | ICD-10-CM | POA: Diagnosis not present

## 2017-07-25 DIAGNOSIS — I5020 Unspecified systolic (congestive) heart failure: Secondary | ICD-10-CM | POA: Diagnosis not present

## 2017-07-27 ENCOUNTER — Ambulatory Visit: Payer: Self-pay | Admitting: Internal Medicine

## 2017-08-06 DIAGNOSIS — J449 Chronic obstructive pulmonary disease, unspecified: Secondary | ICD-10-CM | POA: Diagnosis not present

## 2017-08-08 DIAGNOSIS — J441 Chronic obstructive pulmonary disease with (acute) exacerbation: Secondary | ICD-10-CM | POA: Diagnosis not present

## 2017-08-10 ENCOUNTER — Ambulatory Visit: Payer: Self-pay | Admitting: Internal Medicine

## 2017-08-11 DIAGNOSIS — R32 Unspecified urinary incontinence: Secondary | ICD-10-CM | POA: Diagnosis not present

## 2017-08-11 DIAGNOSIS — G463 Brain stem stroke syndrome: Secondary | ICD-10-CM | POA: Diagnosis not present

## 2017-08-17 ENCOUNTER — Ambulatory Visit: Payer: Self-pay | Admitting: Internal Medicine

## 2017-08-24 DIAGNOSIS — R0602 Shortness of breath: Secondary | ICD-10-CM | POA: Diagnosis not present

## 2017-08-24 DIAGNOSIS — I5020 Unspecified systolic (congestive) heart failure: Secondary | ICD-10-CM | POA: Diagnosis not present

## 2017-08-24 DIAGNOSIS — J9690 Respiratory failure, unspecified, unspecified whether with hypoxia or hypercapnia: Secondary | ICD-10-CM | POA: Diagnosis not present

## 2017-08-24 DIAGNOSIS — R269 Unspecified abnormalities of gait and mobility: Secondary | ICD-10-CM | POA: Diagnosis not present

## 2017-08-24 DIAGNOSIS — J441 Chronic obstructive pulmonary disease with (acute) exacerbation: Secondary | ICD-10-CM | POA: Diagnosis not present

## 2017-09-05 DIAGNOSIS — J449 Chronic obstructive pulmonary disease, unspecified: Secondary | ICD-10-CM | POA: Diagnosis not present

## 2017-09-07 DIAGNOSIS — J441 Chronic obstructive pulmonary disease with (acute) exacerbation: Secondary | ICD-10-CM | POA: Diagnosis not present

## 2017-09-11 DIAGNOSIS — R32 Unspecified urinary incontinence: Secondary | ICD-10-CM | POA: Diagnosis not present

## 2017-09-13 DIAGNOSIS — Z79899 Other long term (current) drug therapy: Secondary | ICD-10-CM | POA: Diagnosis not present

## 2017-09-13 DIAGNOSIS — M154 Erosive (osteo)arthritis: Secondary | ICD-10-CM | POA: Diagnosis not present

## 2017-09-13 DIAGNOSIS — M542 Cervicalgia: Secondary | ICD-10-CM | POA: Diagnosis not present

## 2017-09-13 DIAGNOSIS — J449 Chronic obstructive pulmonary disease, unspecified: Secondary | ICD-10-CM | POA: Diagnosis not present

## 2017-09-13 DIAGNOSIS — F1120 Opioid dependence, uncomplicated: Secondary | ICD-10-CM | POA: Diagnosis not present

## 2017-09-13 DIAGNOSIS — M150 Primary generalized (osteo)arthritis: Secondary | ICD-10-CM | POA: Diagnosis not present

## 2017-09-13 DIAGNOSIS — G8921 Chronic pain due to trauma: Secondary | ICD-10-CM | POA: Diagnosis not present

## 2017-09-13 DIAGNOSIS — G8929 Other chronic pain: Secondary | ICD-10-CM | POA: Diagnosis not present

## 2017-09-13 DIAGNOSIS — M545 Low back pain: Secondary | ICD-10-CM | POA: Diagnosis not present

## 2017-10-06 DIAGNOSIS — J449 Chronic obstructive pulmonary disease, unspecified: Secondary | ICD-10-CM | POA: Diagnosis not present

## 2017-10-08 DIAGNOSIS — J441 Chronic obstructive pulmonary disease with (acute) exacerbation: Secondary | ICD-10-CM | POA: Diagnosis not present

## 2017-10-11 ENCOUNTER — Other Ambulatory Visit: Payer: Self-pay | Admitting: Internal Medicine

## 2017-10-11 DIAGNOSIS — R32 Unspecified urinary incontinence: Secondary | ICD-10-CM | POA: Diagnosis not present

## 2017-10-11 MED ORDER — PRAMIPEXOLE DIHYDROCHLORIDE 0.5 MG PO TABS: 0.5000 mg | ORAL_TABLET | Freq: Three times a day (TID) | ORAL | 1 refills | Status: DC

## 2017-11-05 DIAGNOSIS — J449 Chronic obstructive pulmonary disease, unspecified: Secondary | ICD-10-CM | POA: Diagnosis not present

## 2017-11-07 DIAGNOSIS — J441 Chronic obstructive pulmonary disease with (acute) exacerbation: Secondary | ICD-10-CM | POA: Diagnosis not present

## 2017-11-10 ENCOUNTER — Other Ambulatory Visit: Payer: Self-pay

## 2017-11-10 MED ORDER — ALBUTEROL SULFATE HFA 108 (90 BASE) MCG/ACT IN AERS: 1.0000 | INHALATION_SPRAY | Freq: Four times a day (QID) | RESPIRATORY_TRACT | 3 refills | Status: DC | PRN

## 2017-11-10 MED ORDER — SYMBICORT 160-4.5 MCG/ACT IN AERO: 2.0000 | INHALATION_SPRAY | Freq: Two times a day (BID) | RESPIRATORY_TRACT | 3 refills | Status: DC

## 2017-11-10 MED ORDER — SYMBICORT 160-4.5 MCG/ACT IN AERO: 1.0000 | INHALATION_SPRAY | Freq: Every day | RESPIRATORY_TRACT | 3 refills | Status: DC

## 2017-11-11 DIAGNOSIS — R32 Unspecified urinary incontinence: Secondary | ICD-10-CM | POA: Diagnosis not present

## 2017-11-23 ENCOUNTER — Other Ambulatory Visit: Payer: Self-pay | Admitting: Internal Medicine

## 2017-11-23 MED ORDER — MELOXICAM 7.5 MG PO TABS: 7.5000 mg | ORAL_TABLET | Freq: Two times a day (BID) | ORAL | 0 refills | Status: DC | PRN

## 2017-12-06 ENCOUNTER — Other Ambulatory Visit: Payer: Self-pay | Admitting: Internal Medicine

## 2017-12-06 DIAGNOSIS — J449 Chronic obstructive pulmonary disease, unspecified: Secondary | ICD-10-CM | POA: Diagnosis not present

## 2017-12-07 ENCOUNTER — Telehealth: Payer: Self-pay

## 2017-12-07 NOTE — Telephone Encounter (Signed)
Left a message with [facility] requesting that the CMN for the patient be mailed to us or dropped off at our office

## 2017-12-08 DIAGNOSIS — J441 Chronic obstructive pulmonary disease with (acute) exacerbation: Secondary | ICD-10-CM | POA: Diagnosis not present

## 2017-12-13 DIAGNOSIS — R32 Unspecified urinary incontinence: Secondary | ICD-10-CM | POA: Diagnosis not present

## 2017-12-16 ENCOUNTER — Ambulatory Visit (INDEPENDENT_AMBULATORY_CARE_PROVIDER_SITE_OTHER): Payer: Medicare HMO | Admitting: Adult Health

## 2017-12-16 ENCOUNTER — Encounter: Payer: Self-pay | Admitting: Adult Health

## 2017-12-16 VITALS — BP 144/89 | HR 77 | Resp 18 | Ht 71.0 in | Wt 161.8 lb

## 2017-12-16 DIAGNOSIS — J439 Emphysema, unspecified: Secondary | ICD-10-CM

## 2017-12-16 DIAGNOSIS — Z9981 Dependence on supplemental oxygen: Secondary | ICD-10-CM | POA: Diagnosis not present

## 2017-12-16 DIAGNOSIS — I50813 Acute on chronic right heart failure: Secondary | ICD-10-CM

## 2017-12-16 DIAGNOSIS — R29898 Other symptoms and signs involving the musculoskeletal system: Secondary | ICD-10-CM

## 2017-12-16 DIAGNOSIS — Z23 Encounter for immunization: Secondary | ICD-10-CM

## 2017-12-16 NOTE — Progress Notes (Addendum)
[facility] [address] [place], [place] [address]  Internal MEDICINE  Office Visit Note  Patient Name: Scott Richard  [id number]  [id number]  Date of Service: 12/27/2017  Chief Complaint  Patient presents with  . Edema    feet and legs still swelling , needs to get a wheel chair  . Congestive Heart Failure    HPI Pt here for follow up for Pulmonary edema, oxygen dependence, chronic right sided heart failure, weakness of lower extremities.  He uses 3 liters of oxygen via [place] continuously.  He can use a walker for short distances within the house but he has to stop and rest periodically when going to the restroom for instance.  He has severe dyspnea on exertion even with oxygen.  His social worker is working on getting a motorized wheelchair for him. They feel a motorized chair would be better, since he must also carry his oxygen with him.  His wife reports they have a wheelchair ramp on the house.    Current Medication: Outpatient Encounter Medications as of 12/16/2017  Medication Sig  . albuterol (PROVENTIL HFA;VENTOLIN HFA) 108 (90 Base) MCG/ACT inhaler Inhale 1-2 puffs into the lungs every 6 (six) hours as needed for wheezing or shortness of breath.  . Ascorbic Acid (VITAMIN C) 500 MG CAPS Take by mouth daily.  . cetirizine (ZYRTEC) 10 MG tablet Take 10 mg by mouth daily.  . Cholecalciferol (VITAMIN D-1000 MAX ST) 1000 units tablet Take 1 tablet by mouth daily.  . furosemide (LASIX) 20 MG tablet Take 2 tabs for 3 days and then once a day  . guaiFENesin (MUCINEX) 600 MG 12 hr tablet Take 600 mg by mouth 2 (two) times daily.  Marland Kitchen ipratropium-albuterol (DUONEB) 0.5-2.5 (3) MG/3ML SOLN Take 3 mLs by nebulization every 6 (six) hours as needed (wheezing).  . Multiple Vitamin (MULTIVITAMIN) capsule Take by mouth.  . oxyCODONE (OXY IR/ROXICODONE) 5 MG immediate release tablet Take 1 tablet (5 mg total) by mouth every 6 (six) hours as needed for moderate pain.  . OXYGEN Inhale 2  L into the lungs continuous.  . pramipexole (MIRAPEX) 0.5 MG tablet Take 1 tablet (0.5 mg total) by mouth 3 (three) times daily.  . predniSONE (DELTASONE) 20 MG tablet Take 2 tablets (40 mg total) by mouth daily.  . propranolol (INDERAL) 10 MG tablet Take 1 tablet (10 mg total) by mouth 3 (three) times daily.  . SYMBICORT 160-4.5 MCG/ACT inhaler Inhale 2 puffs into the lungs 2 (two) times daily.  . [DISCONTINUED] fluticasone (FLONASE) 50 MCG/ACT nasal spray Place 2 sprays into both nostrils daily.  . [DISCONTINUED] meloxicam (MOBIC) 7.5 MG tablet Take 1 tablet (7.5 mg total) by mouth 2 (two) times daily as needed.   No facility-administered encounter medications on file as of 12/16/2017.     Surgical History: Past Surgical History:  Procedure Laterality Date  . NECK SURGERY    . TONSILLECTOMY      Medical History: Past Medical History:  Diagnosis Date  . Arthritis   . CHF (congestive heart failure) (HCC)   . COPD (chronic obstructive pulmonary disease) (HCC)   . Hypertension     Family History: Family History  Problem Relation Age of Onset  . Prostate cancer Neg Hx   . Bladder Cancer Neg Hx   . Kidney cancer Neg Hx     Social History   Socioeconomic History  . Marital status: Divorced    Spouse name: Not on file  . Number  of children: Not on file  . Years of education: Not on file  . Highest education level: Not on file  Occupational History  . Not on file  Social Needs  . Financial resource strain: Not on file  . Food insecurity:    Worry: Not on file    Inability: Not on file  . Transportation needs:    Medical: Not on file    Non-medical: Not on file  Tobacco Use  . Smoking status: Former Smoker    Last attempt to quit: 2012    Years since quitting: 7.7  . Smokeless tobacco: Never Used  Substance and Sexual Activity  . Alcohol use: No  . Drug use: No  . Sexual activity: Not on file  Lifestyle  . Physical activity:    Days per week: Not on file     Minutes per session: Not on file  . Stress: Not on file  Relationships  . Social connections:    Talks on phone: Not on file    Gets together: Not on file    Attends religious service: Not on file    Active member of club or organization: Not on file    Attends meetings of clubs or organizations: Not on file    Relationship status: Not on file  . Intimate partner violence:    Fear of current or ex partner: Not on file    Emotionally abused: Not on file    Physically abused: Not on file    Forced sexual activity: Not on file  Other Topics Concern  . Not on file  Social History Narrative  . Not on file     Review of Systems  Constitutional: Negative.  Negative for chills, fatigue and unexpected weight change.  HENT: Negative.  Negative for congestion, rhinorrhea, sneezing and sore throat.   Eyes: Negative for redness.  Respiratory: Negative.  Negative for cough, chest tightness and shortness of breath.   Cardiovascular: Negative.  Negative for chest pain and palpitations.  Gastrointestinal: Negative.  Negative for abdominal pain, constipation, diarrhea, nausea and vomiting.  Endocrine: Negative.   Genitourinary: Negative.  Negative for dysuria and frequency.  Musculoskeletal: Negative.  Negative for arthralgias, back pain, joint swelling and neck pain.  Skin: Negative.  Negative for rash.  Allergic/Immunologic: Negative.   Neurological: Negative.  Negative for tremors and numbness.  Hematological: Negative for adenopathy. Does not bruise/bleed easily.  Psychiatric/Behavioral: Negative.  Negative for behavioral problems, sleep disturbance and suicidal ideas. The patient is not nervous/anxious.     Vital Signs: BP (!) 144/89   Pulse 77   Resp 18   Ht 5\' 11"  (1.803 m)   Wt 161 lb 12.8 oz (73.4 kg)   SpO2 90%   BMI 22.57 kg/m    Physical Exam  Constitutional: He is oriented to person, place, and time. He appears well-developed and well-nourished. No distress.  HENT:   Head: Normocephalic and atraumatic.  Mouth/Throat: Oropharynx is clear and moist. No oropharyngeal exudate.  Eyes: Pupils are equal, round, and reactive to light. EOM are normal.  Neck: Normal range of motion. Neck supple. No JVD present. No tracheal deviation present. No thyromegaly present.  Cardiovascular: Normal rate, regular rhythm and normal heart sounds. Exam reveals no gallop and no friction rub.  No murmur heard. Pulmonary/Chest: Effort normal and breath sounds normal. No respiratory distress. He has no wheezes. He has no rales. He exhibits no tenderness.  Abdominal: Soft. There is no tenderness. There is no guarding.  Musculoskeletal: Normal range of motion.  Lymphadenopathy:    He has no cervical adenopathy.  Neurological: He is alert and oriented to person, place, and time. No cranial nerve deficit.  Skin: Skin is warm and dry. He is not diaphoretic.  Psychiatric: He has a normal mood and affect. His behavior is normal. Judgment and thought content normal.  Nursing note and vitals reviewed.  Assessment/Plan: 1. Pulmonary emphysema, unspecified emphysema type (HCC) Pt is oxygen dependant at this time.     2. Dependence on supplemental oxygen Pt uses oxygen at 3 lpm continuously.   3. Acute on chronic right-sided congestive heart failure (HCC) Pt oxygen dependant.  4. Weakness of both lower extremities Pt unable to walk moderate distances without resting.  .dfk  5. Flu vaccine need - Flu Vaccine MDCK QUAD PF  General Counseling: Story verbalizes understanding of the findings of todays visit and agrees with plan of treatment. I have discussed any further diagnostic evaluation that may be needed or ordered today. We also reviewed his medications today. he has been encouraged to call the office with any questions or concerns that should arise related to todays visit.  Face to face evaluation for motorized wheelchair Pt is complaining of pain and swelling with ambulation.  On Home O2 as well with end stage lung disease  He needs to have motorized wheel chair to continue with his ADL's   Orders Placed This Encounter  Procedures  . Flu Vaccine MDCK QUAD PF    Time spent: 25 Minutes   This patient was seen by Blima Ledger AGNP-C in Collaboration with Dr Lyndon Code as a part of collaborative care agreement    Dr Lyndon Code Internal medicine

## 2017-12-16 NOTE — Patient Instructions (Signed)

## 2017-12-17 ENCOUNTER — Other Ambulatory Visit: Payer: Self-pay | Admitting: Internal Medicine

## 2017-12-20 ENCOUNTER — Other Ambulatory Visit: Payer: Self-pay | Admitting: Internal Medicine

## 2017-12-21 ENCOUNTER — Telehealth: Payer: Self-pay

## 2017-12-21 NOTE — Telephone Encounter (Signed)
[facility] SIGNED CMN FOR O2 IN [facility] F[facility].

## 2017-12-27 DIAGNOSIS — J449 Chronic obstructive pulmonary disease, unspecified: Secondary | ICD-10-CM | POA: Diagnosis not present

## 2017-12-27 DIAGNOSIS — F1120 Opioid dependence, uncomplicated: Secondary | ICD-10-CM | POA: Diagnosis not present

## 2017-12-27 DIAGNOSIS — G8921 Chronic pain due to trauma: Secondary | ICD-10-CM | POA: Diagnosis not present

## 2017-12-27 DIAGNOSIS — M542 Cervicalgia: Secondary | ICD-10-CM | POA: Diagnosis not present

## 2017-12-27 DIAGNOSIS — M154 Erosive (osteo)arthritis: Secondary | ICD-10-CM | POA: Diagnosis not present

## 2017-12-27 DIAGNOSIS — M545 Low back pain: Secondary | ICD-10-CM | POA: Diagnosis not present

## 2017-12-27 DIAGNOSIS — Z79899 Other long term (current) drug therapy: Secondary | ICD-10-CM | POA: Diagnosis not present

## 2017-12-27 DIAGNOSIS — M150 Primary generalized (osteo)arthritis: Secondary | ICD-10-CM | POA: Diagnosis not present

## 2018-01-03 ENCOUNTER — Other Ambulatory Visit: Payer: Self-pay | Admitting: Adult Health

## 2018-01-06 DIAGNOSIS — J449 Chronic obstructive pulmonary disease, unspecified: Secondary | ICD-10-CM | POA: Diagnosis not present

## 2018-01-08 DIAGNOSIS — J441 Chronic obstructive pulmonary disease with (acute) exacerbation: Secondary | ICD-10-CM | POA: Diagnosis not present

## 2018-01-11 DIAGNOSIS — R32 Unspecified urinary incontinence: Secondary | ICD-10-CM | POA: Diagnosis not present

## 2018-01-12 ENCOUNTER — Encounter: Payer: Self-pay | Admitting: Adult Health

## 2018-01-12 ENCOUNTER — Ambulatory Visit (INDEPENDENT_AMBULATORY_CARE_PROVIDER_SITE_OTHER): Payer: Medicare HMO | Admitting: Adult Health

## 2018-01-12 VITALS — BP 144/68 | HR 62 | Resp 16 | Ht 71.0 in | Wt 161.2 lb

## 2018-01-12 DIAGNOSIS — R29898 Other symptoms and signs involving the musculoskeletal system: Secondary | ICD-10-CM

## 2018-01-12 DIAGNOSIS — E782 Mixed hyperlipidemia: Secondary | ICD-10-CM | POA: Diagnosis not present

## 2018-01-12 DIAGNOSIS — Z125 Encounter for screening for malignant neoplasm of prostate: Secondary | ICD-10-CM

## 2018-01-12 DIAGNOSIS — J449 Chronic obstructive pulmonary disease, unspecified: Secondary | ICD-10-CM

## 2018-01-12 DIAGNOSIS — Z9981 Dependence on supplemental oxygen: Secondary | ICD-10-CM

## 2018-01-12 DIAGNOSIS — I50812 Chronic right heart failure: Secondary | ICD-10-CM

## 2018-01-12 DIAGNOSIS — I10 Essential (primary) hypertension: Secondary | ICD-10-CM | POA: Diagnosis not present

## 2018-01-12 DIAGNOSIS — Z0001 Encounter for general adult medical examination with abnormal findings: Secondary | ICD-10-CM | POA: Diagnosis not present

## 2018-01-12 NOTE — Progress Notes (Addendum)
[facility] [address] [place], [place] [address]  Internal MEDICINE  Office Visit Note  Patient Name: Scott Richard  [id number]  [id number]  Date of Service: 01/17/2018  Chief Complaint  Patient presents with  . Medicare Wellness  . Arthritis  . COPD  . Congestive Heart Failure  . Hypertension   HPI Pt is here for routine health maintenance examination.  He is a 82 yo male, who is on oxygen and using a walker.  He has a history of arthritis, copd, chf and htn.  He reports his arthritis is controlled.  His copd is doing well currently, he uses continuous oxygen at 2 lpm via nasal [place].  He is using his Symbicort and albuterol without issues at this time.    He was last hospitalized for his CHF exacerbation in July of 2018.  He denies recent issues.  Appears to be controlled with current medications including lasix.  His systolic BP is slightly elevated at this visit.  He reports taking his medications as directed.  He does not use tobacco, alcohol or illicit drugs.   Current Medication: Outpatient Encounter Medications as of 01/12/2018  Medication Sig  . albuterol (PROVENTIL HFA;VENTOLIN HFA) 108 (90 Base) MCG/ACT inhaler Inhale 1-2 puffs into the lungs every 6 (six) hours as needed for wheezing or shortness of breath.  . Ascorbic Acid (VITAMIN C) 500 MG CAPS Take by mouth daily.  . cetirizine (ZYRTEC) 10 MG tablet Take 10 mg by mouth daily.  . Cholecalciferol (VITAMIN D-1000 MAX ST) 1000 units tablet Take 1 tablet by mouth daily.  . fluticasone (FLONASE) 50 MCG/ACT nasal spray Place 2 sprays into both nostrils daily.  . furosemide (LASIX) 20 MG tablet Take 2 tabs for 3 days and then once a day  . guaiFENesin (MUCINEX) 600 MG 12 hr tablet Take 600 mg by mouth 2 (two) times daily.  Marland Kitchen ipratropium-albuterol (DUONEB) 0.5-2.5 (3) MG/3ML SOLN Take 3 mLs by nebulization every 6 (six) hours as needed (wheezing).  . meloxicam (MOBIC) 7.5 MG tablet Take 1 tablet (7.5 mg  total) by mouth 2 (two) times daily as needed.  . Multiple Vitamin (MULTIVITAMIN) capsule Take by mouth.  . oxyCODONE (OXY IR/ROXICODONE) 5 MG immediate release tablet Take 1 tablet (5 mg total) by mouth every 6 (six) hours as needed for moderate pain.  . OXYGEN Inhale 2 L into the lungs continuous.  . pramipexole (MIRAPEX) 0.5 MG tablet Take 1 tablet (0.5 mg total) by mouth 3 (three) times daily.  . predniSONE (DELTASONE) 20 MG tablet Take 2 tablets (40 mg total) by mouth daily.  . propranolol (INDERAL) 10 MG tablet Take 1 tablet (10 mg total) by mouth 3 (three) times daily.  . SYMBICORT 160-4.5 MCG/ACT inhaler Inhale 2 puffs into the lungs 2 (two) times daily.   No facility-administered encounter medications on file as of 01/12/2018.     Surgical History: Past Surgical History:  Procedure Laterality Date  . NECK SURGERY    . TONSILLECTOMY      Medical History: Past Medical History:  Diagnosis Date  . Arthritis   . CHF (congestive heart failure) (HCC)   . COPD (chronic obstructive pulmonary disease) (HCC)   . Hypertension     Family History: Family History  Problem Relation Age of Onset  . Prostate cancer Neg Hx   . Bladder Cancer Neg Hx   . Kidney cancer Neg Hx    Depression screen [facility] 2/9 01/12/2018 12/16/2017 05/19/2017  Decreased Interest 0  0 0  Down, Depressed, Hopeless 0 0 0  PHQ - 2 Score 0 0 0    Functional Status Survey: Is the patient deaf or have difficulty hearing?: No Does the patient have difficulty seeing, even when wearing glasses/contacts?: No Does the patient have difficulty concentrating, remembering, or making decisions?: No Does the patient have difficulty walking or climbing stairs?: Yes(pt uses a walker) Does the patient have difficulty dressing or bathing?: Yes Does the patient have difficulty doing errands alone such as visiting a doctor's office or shopping?: Yes  MMSE - Mini Mental State Exam 01/12/2018  Orientation to time 5  Orientation to  Place 5  Registration 3  Attention/ Calculation 5  Recall 3  Language- name 2 objects 2  Language- repeat 1  Language- follow 3 step command 3  Language- read & follow direction 1  Write a sentence 1  Copy design 1  Total score 30    Fall Risk  01/12/2018 12/16/2017 05/19/2017 05/19/2017  Falls in the past year? No No Yes No  Number falls in past yr: - - 1 -  Injury with Fall? - - Yes -      Review of Systems  Constitutional: Negative.  Negative for chills, fatigue and unexpected weight change.  HENT: Negative.  Negative for congestion, rhinorrhea, sneezing and sore throat.   Eyes: Negative for redness.  Respiratory: Negative.  Negative for cough, chest tightness and shortness of breath.   Cardiovascular: Negative.  Negative for chest pain and palpitations.  Gastrointestinal: Negative.  Negative for abdominal pain, constipation, diarrhea, nausea and vomiting.  Endocrine: Negative.   Genitourinary: Negative.  Negative for dysuria and frequency.  Musculoskeletal: Negative.  Negative for arthralgias, back pain, joint swelling and neck pain.  Skin: Negative.  Negative for rash.  Allergic/Immunologic: Negative.   Neurological: Negative.  Negative for tremors and numbness.  Hematological: Negative for adenopathy. Does not bruise/bleed easily.  Psychiatric/Behavioral: Negative.  Negative for behavioral problems, sleep disturbance and suicidal ideas. The patient is not nervous/anxious.      Vital Signs: BP (!) 144/68 (BP Location: Right Arm, Patient Position: Sitting, Cuff Size: Normal)   Pulse 62   Resp 16   Ht 5\' 11"  (1.803 m)   Wt 161 lb 3.2 oz (73.1 kg)   SpO2 97%   BMI 22.48 kg/m    Physical Exam  Constitutional: He is oriented to person, place, and time. He appears well-developed and well-nourished. No distress.  HENT:  Head: Normocephalic and atraumatic.  Mouth/Throat: Oropharynx is clear and moist. No oropharyngeal exudate.  Eyes: Pupils are equal, round, and reactive  to light. EOM are normal.  Neck: Normal range of motion. Neck supple. No JVD present. No tracheal deviation present. No thyromegaly present.  Cardiovascular: Normal rate, regular rhythm and normal heart sounds. Exam reveals no gallop and no friction rub.  No murmur heard. Mild swelling to BLE.  Pt reports this is normal.  On lasix.   Pulmonary/Chest: Effort normal and breath sounds normal. No respiratory distress. He has no wheezes. He has no rales. He exhibits no tenderness.  Abdominal: Soft. There is no tenderness. There is no guarding.  Musculoskeletal: Normal range of motion.  Lymphadenopathy:    He has no cervical adenopathy.  Neurological: He is alert and oriented to person, place, and time. No cranial nerve deficit.  Skin: Skin is warm and dry. He is not diaphoretic.  Psychiatric: He has a normal mood and affect. His behavior is normal. Judgment and thought content  normal.  Nursing note and vitals reviewed.  Assessment/Plan: 1. Encounter for general adult medical examination with abnormal findings Up to date on PHM.   Will monitor labs and treat accordingly. - CBC with Differential/Platelet - Lipid Panel With LDL/HDL Ratio - TSH - T4, free - Comprehensive metabolic panel  2. Essential hypertension Systolic slightly elevated today.  Will monitor at future visits.  3. Chronic obstructive pulmonary disease, unspecified COPD type (HCC) Continue using inhalers, pt doing well at this time on supplemental oxygen.   4. Dependence on supplemental oxygen Continue current therapy, at 2 lpm continuously.   5. Weakness of both lower extremities Pt uses walker, he is able to ambulate independently using walker.   6. Chronic right-sided heart failure (HCC) Stable, continue current therapy.  7. Mixed hyperlipidemia Will follow lipid panel when available.  8. Screening for prostate cancer - PSA  General Counseling: Johntavius verbalizes understanding of the findings of todays visit  and agrees with plan of treatment. I have discussed any further diagnostic evaluation that may be needed or ordered today. We also reviewed his medications today. he has been encouraged to call the office with any questions or concerns that should arise related to todays visit.   Orders Placed This Encounter  Procedures  . CBC with Differential/Platelet  . Lipid Panel With LDL/HDL Ratio  . TSH  . T4, free  . Comprehensive metabolic panel  . PSA    Time spent: 30 Minutes   This patient was seen by Blima Ledger AGNP-C in Collaboration with Dr Lyndon Code as a part of collaborative care agreement   Johnna Acosta AGNP-C  Internal Medicine

## 2018-01-12 NOTE — Patient Instructions (Signed)

## 2018-01-17 ENCOUNTER — Other Ambulatory Visit: Payer: Self-pay

## 2018-01-17 MED ORDER — FLUTICASONE PROPIONATE 50 MCG/ACT NA SUSP: 2.0000 | Freq: Every day | NASAL | 3 refills | Status: AC

## 2018-01-18 ENCOUNTER — Other Ambulatory Visit: Payer: Self-pay | Admitting: Adult Health

## 2018-02-01 ENCOUNTER — Ambulatory Visit: Payer: Self-pay | Admitting: Adult Health

## 2018-02-01 ENCOUNTER — Emergency Department: Payer: Medicare HMO

## 2018-02-01 ENCOUNTER — Other Ambulatory Visit: Payer: Self-pay

## 2018-02-01 ENCOUNTER — Inpatient Hospital Stay
Admission: EM | Admit: 2018-02-01 | Discharge: 2018-02-04 | DRG: 291 | Disposition: A | Discharge status: Home Health | Payer: Medicare HMO | Attending: Internal Medicine | Admitting: Internal Medicine

## 2018-02-01 DIAGNOSIS — T501X5A Adverse effect of loop [high-ceiling] diuretics, initial encounter: Secondary | ICD-10-CM | POA: Diagnosis not present

## 2018-02-01 DIAGNOSIS — I509 Heart failure, unspecified: Secondary | ICD-10-CM

## 2018-02-01 DIAGNOSIS — I110 Hypertensive heart disease with heart failure: Principal | ICD-10-CM | POA: Diagnosis present

## 2018-02-01 DIAGNOSIS — M1990 Unspecified osteoarthritis, unspecified site: Secondary | ICD-10-CM | POA: Diagnosis present

## 2018-02-01 DIAGNOSIS — E43 Unspecified severe protein-calorie malnutrition: Secondary | ICD-10-CM | POA: Diagnosis not present

## 2018-02-01 DIAGNOSIS — Z7952 Long term (current) use of systemic steroids: Secondary | ICD-10-CM

## 2018-02-01 DIAGNOSIS — I5033 Acute on chronic diastolic (congestive) heart failure: Secondary | ICD-10-CM | POA: Diagnosis not present

## 2018-02-01 DIAGNOSIS — I959 Hypotension, unspecified: Secondary | ICD-10-CM | POA: Diagnosis not present

## 2018-02-01 DIAGNOSIS — Z6824 Body mass index (BMI) 24.0-24.9, adult: Secondary | ICD-10-CM

## 2018-02-01 DIAGNOSIS — I5020 Unspecified systolic (congestive) heart failure: Secondary | ICD-10-CM | POA: Diagnosis not present

## 2018-02-01 DIAGNOSIS — R001 Bradycardia, unspecified: Secondary | ICD-10-CM | POA: Diagnosis not present

## 2018-02-01 DIAGNOSIS — R0902 Hypoxemia: Secondary | ICD-10-CM

## 2018-02-01 DIAGNOSIS — Z87891 Personal history of nicotine dependence: Secondary | ICD-10-CM

## 2018-02-01 DIAGNOSIS — Z79899 Other long term (current) drug therapy: Secondary | ICD-10-CM

## 2018-02-01 DIAGNOSIS — J9601 Acute respiratory failure with hypoxia: Secondary | ICD-10-CM | POA: Diagnosis not present

## 2018-02-01 DIAGNOSIS — Z981 Arthrodesis status: Secondary | ICD-10-CM | POA: Diagnosis not present

## 2018-02-01 DIAGNOSIS — L8990 Pressure ulcer of unspecified site, unspecified stage: Secondary | ICD-10-CM | POA: Diagnosis present

## 2018-02-01 DIAGNOSIS — Z9981 Dependence on supplemental oxygen: Secondary | ICD-10-CM

## 2018-02-01 DIAGNOSIS — J9622 Acute and chronic respiratory failure with hypercapnia: Secondary | ICD-10-CM | POA: Diagnosis present

## 2018-02-01 DIAGNOSIS — I4891 Unspecified atrial fibrillation: Secondary | ICD-10-CM | POA: Diagnosis not present

## 2018-02-01 DIAGNOSIS — Z7951 Long term (current) use of inhaled steroids: Secondary | ICD-10-CM

## 2018-02-01 DIAGNOSIS — R0602 Shortness of breath: Secondary | ICD-10-CM | POA: Diagnosis not present

## 2018-02-01 DIAGNOSIS — J9811 Atelectasis: Secondary | ICD-10-CM | POA: Diagnosis present

## 2018-02-01 DIAGNOSIS — J9690 Respiratory failure, unspecified, unspecified whether with hypoxia or hypercapnia: Secondary | ICD-10-CM | POA: Diagnosis present

## 2018-02-01 DIAGNOSIS — R0600 Dyspnea, unspecified: Secondary | ICD-10-CM

## 2018-02-01 DIAGNOSIS — E871 Hypo-osmolality and hyponatremia: Secondary | ICD-10-CM | POA: Diagnosis not present

## 2018-02-01 DIAGNOSIS — I480 Paroxysmal atrial fibrillation: Secondary | ICD-10-CM | POA: Diagnosis not present

## 2018-02-01 DIAGNOSIS — J9621 Acute and chronic respiratory failure with hypoxia: Secondary | ICD-10-CM | POA: Diagnosis present

## 2018-02-01 DIAGNOSIS — J441 Chronic obstructive pulmonary disease with (acute) exacerbation: Secondary | ICD-10-CM | POA: Diagnosis not present

## 2018-02-01 LAB — TROPONIN I: Troponin I: <0.03 ng/mL (ref <0.03)

## 2018-02-01 LAB — CBC WITH DIFFERENTIAL/PLATELET
Abs Immature Granulocytes: 0.04 K/uL (ref 0.00–0.07)
Basophils Absolute: 0.1 K/uL (ref 0.0–0.1)
Basophils Relative: 1 %
Eosinophils Absolute: 0.1 K/uL (ref 0.0–0.5)
Eosinophils Relative: 1 %
HCT: 39.5 % (ref 39.0–52.0)
Hemoglobin: 12.3 g/dL — ABNORMAL LOW (ref 13.0–17.0)
Immature Granulocytes: 0 %
Lymphocytes Relative: 8 %
Lymphs Abs: 0.8 K/uL (ref 0.7–4.0)
MCH: 29.7 pg (ref 26.0–34.0)
MCHC: 31.1 g/dL (ref 30.0–36.0)
MCV: 95.4 fL (ref 80.0–100.0)
Monocytes Absolute: 1.5 K/uL — ABNORMAL HIGH (ref 0.1–1.0)
Monocytes Relative: 14 %
Neutro Abs: 8.4 K/uL — ABNORMAL HIGH (ref 1.7–7.7)
Neutrophils Relative %: 76 %
Platelets: 201 K/uL (ref 150–400)
RBC: 4.14 MIL/uL — ABNORMAL LOW (ref 4.22–5.81)
RDW: 13.4 % (ref 11.5–15.5)
WBC: 11 K/uL — ABNORMAL HIGH (ref 4.0–10.5)
nRBC: 0 % (ref 0.0–0.2)

## 2018-02-01 LAB — BASIC METABOLIC PANEL
ANION GAP: 9 (ref 5–15)
BUN: 19 mg/dL (ref 8–23)
CO2: 40 mmol/L — ABNORMAL HIGH (ref 22–32)
Calcium: 9.3 mg/dL (ref 8.9–10.3)
Chloride: 84 mmol/L — ABNORMAL LOW (ref 98–111)
Creatinine, Ser: 0.7 mg/dL (ref 0.61–1.24)
GLUCOSE: 102 mg/dL — AB (ref 70–99)
POTASSIUM: 4.3 mmol/L (ref 3.5–5.1)
Sodium: 133 mmol/L — ABNORMAL LOW (ref 135–145)

## 2018-02-01 LAB — APTT: aPTT: 32 seconds (ref 24–36)

## 2018-02-01 LAB — PROTIME-INR
INR: 0.98
Prothrombin Time: 12.9 seconds (ref 11.4–15.2)

## 2018-02-01 MED ORDER — ACETAMINOPHEN 325 MG PO TABS: 650.0000 mg | ORAL_TABLET | ORAL | Status: DC | PRN

## 2018-02-01 MED ORDER — METHYLPREDNISOLONE SODIUM SUCC 125 MG IJ SOLR
INTRAMUSCULAR | Status: AC
  Administered 2018-02-01: 125 mg via INTRAVENOUS
  Filled 2018-02-01: qty 2

## 2018-02-01 MED ORDER — METHYLPREDNISOLONE SODIUM SUCC 125 MG IJ SOLR
60.0000 mg | Freq: Four times a day (QID) | INTRAMUSCULAR | Status: DC
  Administered 2018-02-01 – 2018-02-03 (×7): 60 mg via INTRAVENOUS
  Filled 2018-02-01 (×7): qty 2

## 2018-02-01 MED ORDER — AZITHROMYCIN 250 MG PO TABS
250.0000 mg | ORAL_TABLET | Freq: Every day | ORAL | Status: DC
  Administered 2018-02-02 – 2018-02-04 (×3): 250 mg via ORAL
  Filled 2018-02-01 (×3): qty 1

## 2018-02-01 MED ORDER — MOMETASONE FURO-FORMOTEROL FUM 200-5 MCG/ACT IN AERO
2.0000 | INHALATION_SPRAY | Freq: Two times a day (BID) | RESPIRATORY_TRACT | Status: DC
  Administered 2018-02-01 – 2018-02-04 (×5): 2 via RESPIRATORY_TRACT
  Filled 2018-02-01: qty 8.8

## 2018-02-01 MED ORDER — METHYLPREDNISOLONE SODIUM SUCC 125 MG IJ SOLR
125.0000 mg | Freq: Once | INTRAMUSCULAR | Status: AC
  Administered 2018-02-01: 125 mg via INTRAVENOUS

## 2018-02-01 MED ORDER — FUROSEMIDE 10 MG/ML IJ SOLN
INTRAMUSCULAR | Status: AC
  Administered 2018-02-01: 40 mg via INTRAVENOUS
  Filled 2018-02-01: qty 4

## 2018-02-01 MED ORDER — HEPARIN (PORCINE) IN NACL 100-0.45 UNIT/ML-% IJ SOLN
1150.0000 [IU]/h | INTRAMUSCULAR | Status: DC
  Administered 2018-02-01: 1150 [IU]/h via INTRAVENOUS
  Filled 2018-02-01: qty 250

## 2018-02-01 MED ORDER — HEPARIN (PORCINE) IN NACL 100-0.45 UNIT/ML-% IJ SOLN: 10.0000 [IU]/kg/h | INTRAMUSCULAR | Status: DC

## 2018-02-01 MED ORDER — FUROSEMIDE 10 MG/ML IJ SOLN
40.0000 mg | Freq: Once | INTRAMUSCULAR | Status: AC
  Administered 2018-02-01: 40 mg via INTRAVENOUS

## 2018-02-01 MED ORDER — GUAIFENESIN ER 600 MG PO TB12
600.0000 mg | ORAL_TABLET | Freq: Two times a day (BID) | ORAL | Status: DC
  Administered 2018-02-01 – 2018-02-04 (×6): 600 mg via ORAL
  Filled 2018-02-01 (×6): qty 1

## 2018-02-01 MED ORDER — ONDANSETRON HCL 4 MG/2ML IJ SOLN: 4.0000 mg | Freq: Four times a day (QID) | INTRAMUSCULAR | Status: DC | PRN

## 2018-02-01 MED ORDER — IPRATROPIUM-ALBUTEROL 0.5-2.5 (3) MG/3ML IN SOLN
RESPIRATORY_TRACT | Status: AC
  Filled 2018-02-01: qty 6

## 2018-02-01 MED ORDER — IPRATROPIUM-ALBUTEROL 0.5-2.5 (3) MG/3ML IN SOLN: 3.0000 mL | Freq: Four times a day (QID) | RESPIRATORY_TRACT | Status: DC | PRN

## 2018-02-01 MED ORDER — FUROSEMIDE 10 MG/ML IJ SOLN
20.0000 mg | Freq: Two times a day (BID) | INTRAMUSCULAR | Status: DC
  Administered 2018-02-02 – 2018-02-03 (×3): 20 mg via INTRAVENOUS
  Filled 2018-02-01 (×3): qty 4

## 2018-02-01 MED ORDER — MELOXICAM 7.5 MG PO TABS
7.5000 mg | ORAL_TABLET | Freq: Every day | ORAL | Status: DC | PRN
  Filled 2018-02-01: qty 1

## 2018-02-01 MED ORDER — SODIUM CHLORIDE 0.9% FLUSH: 3.0000 mL | INTRAVENOUS | Status: DC | PRN

## 2018-02-01 MED ORDER — OXYCODONE HCL 5 MG PO TABS: 5.0000 mg | ORAL_TABLET | Freq: Three times a day (TID) | ORAL | Status: DC | PRN

## 2018-02-01 MED ORDER — LISINOPRIL 5 MG PO TABS
2.5000 mg | ORAL_TABLET | Freq: Every day | ORAL | Status: DC
  Administered 2018-02-02 – 2018-02-04 (×3): 2.5 mg via ORAL
  Filled 2018-02-01 (×3): qty 1

## 2018-02-01 MED ORDER — HEPARIN BOLUS VIA INFUSION
5000.0000 [IU] | Freq: Once | INTRAVENOUS | Status: AC
  Administered 2018-02-01: 5000 [IU] via INTRAVENOUS
  Filled 2018-02-01: qty 5000

## 2018-02-01 MED ORDER — VITAMIN D 1000 UNITS PO TABS
1000.0000 [IU] | ORAL_TABLET | Freq: Every day | ORAL | Status: DC
  Administered 2018-02-02 – 2018-02-04 (×3): 1000 [IU] via ORAL
  Filled 2018-02-01 (×3): qty 1

## 2018-02-01 MED ORDER — ADULT MULTIVITAMIN W/MINERALS CH
1.0000 | ORAL_TABLET | Freq: Every day | ORAL | Status: DC
  Administered 2018-02-02 – 2018-02-04 (×3): 1 via ORAL
  Filled 2018-02-01 (×3): qty 1

## 2018-02-01 MED ORDER — CARVEDILOL 3.125 MG PO TABS
6.2500 mg | ORAL_TABLET | Freq: Two times a day (BID) | ORAL | Status: DC
  Administered 2018-02-02: 6.25 mg via ORAL
  Filled 2018-02-01 (×3): qty 2

## 2018-02-01 MED ORDER — IPRATROPIUM-ALBUTEROL 0.5-2.5 (3) MG/3ML IN SOLN
3.0000 mL | Freq: Once | RESPIRATORY_TRACT | Status: AC
  Administered 2018-02-01: 3 mL via RESPIRATORY_TRACT
  Filled 2018-02-01: qty 3

## 2018-02-01 MED ORDER — FLUTICASONE PROPIONATE 50 MCG/ACT NA SUSP
2.0000 | Freq: Two times a day (BID) | NASAL | Status: DC
  Administered 2018-02-01 – 2018-02-04 (×5): 2 via NASAL
  Filled 2018-02-01: qty 16

## 2018-02-01 MED ORDER — LORATADINE 10 MG PO TABS: 10.0000 mg | ORAL_TABLET | Freq: Every day | ORAL | Status: DC | PRN

## 2018-02-01 MED ORDER — VITAMIN C 500 MG PO TABS
500.0000 mg | ORAL_TABLET | Freq: Every day | ORAL | Status: DC
  Administered 2018-02-02 – 2018-02-04 (×3): 500 mg via ORAL
  Filled 2018-02-01 (×3): qty 1

## 2018-02-01 MED ORDER — PRAMIPEXOLE DIHYDROCHLORIDE 0.25 MG PO TABS
0.5000 mg | ORAL_TABLET | Freq: Three times a day (TID) | ORAL | Status: DC
  Administered 2018-02-01 – 2018-02-04 (×8): 0.5 mg via ORAL
  Filled 2018-02-01 (×8): qty 2

## 2018-02-01 MED ORDER — SODIUM CHLORIDE 0.9 % IV SOLN: 250.0000 mL | INTRAVENOUS | Status: DC | PRN

## 2018-02-01 MED ORDER — SODIUM CHLORIDE 0.9% FLUSH
3.0000 mL | Freq: Two times a day (BID) | INTRAVENOUS | Status: DC
  Administered 2018-02-01 – 2018-02-04 (×6): 3 mL via INTRAVENOUS

## 2018-02-01 MED ORDER — AZITHROMYCIN 500 MG PO TABS: 500.0000 mg | ORAL_TABLET | Freq: Every day | ORAL | Status: AC

## 2018-02-01 NOTE — ED Notes (Signed)
Pt given urinal for urine sample 

## 2018-02-01 NOTE — Progress Notes (Signed)
ANTICOAGULATION CONSULT NOTE - Follow Up Consult  Pharmacy Consult for Heparin  Indication: atrial fibrillation  No Known Allergies  Patient Measurements: Height: 5\' 9"  (175.3 cm) Weight: 161 lb (73 kg) IBW/kg (Calculated) : 70.7 Heparin Dosing Weight:  73 kg   Vital Signs: Temp: 97.8 F (36.6 C) (10/22 1742) Temp Source: Oral (10/22 1742) BP: 140/83 (10/22 1742) Pulse Rate: 80 (10/22 1742)  Labs: Recent Labs    02/01/18 1242 02/01/18 1706 02/01/18 1827  HGB 12.3*  --   --   HCT 39.5  --   --   PLT 201  --   --   APTT  --   --  32  LABPROT  --   --  12.9  INR  --   --  0.98  CREATININE 0.70  --   --   TROPONINI <0.03 <0.03 <0.03    Estimated Creatinine Clearance: 73.6 mL/min (by C-G formula based on SCr of 0.7 mg/dL).   Medications:  Medications Prior to Admission  Medication Sig Dispense Refill Last Dose  . albuterol (PROVENTIL HFA;VENTOLIN HFA) 108 (90 Base) MCG/ACT inhaler Inhale 1-2 puffs into the lungs every 6 (six) hours as needed for wheezing or shortness of breath. 1 Inhaler 3 prn at prn  . cetirizine (ZYRTEC) 10 MG tablet Take 10 mg by mouth daily.   prn at prn  . Cholecalciferol (VITAMIN D-1000 MAX ST) 1000 units tablet Take 1 tablet by mouth daily.   02/01/2018 at Unknown time  . fluticasone (FLONASE) 50 MCG/ACT nasal spray Place 2 sprays into both nostrils daily. (Patient taking differently: Place 2 sprays into both nostrils 2 (two) times daily. ) 16 g 3 02/01/2018 at 1000  . furosemide (LASIX) 20 MG tablet Take 2 tabs for 3 days and then once a day (Patient taking differently: Take 20 mg by mouth daily. ) 45 tablet 6 02/01/2018 at 1000  . ipratropium-albuterol (DUONEB) 0.5-2.5 (3) MG/3ML SOLN Take 3 mLs by nebulization every 6 (six) hours as needed (wheezing). 360 mL 0 prn at prn  . meloxicam (MOBIC) 7.5 MG tablet Take 1 tablet (7.5 mg total) by mouth 2 (two) times daily as needed. 60 tablet 0  at prn  . Multiple Vitamin (MULTIVITAMIN) capsule Take by  mouth.   Unknown at Unknown  . oxyCODONE (OXY IR/ROXICODONE) 5 MG immediate release tablet Take 1 tablet (5 mg total) by mouth every 6 (six) hours as needed for moderate pain. 20 tablet 0 prn at prn  . pramipexole (MIRAPEX) 0.5 MG tablet Take 1 tablet (0.5 mg total) by mouth 3 (three) times daily. 90 tablet 0 02/01/2018 at 1000  . SYMBICORT 160-4.5 MCG/ACT inhaler Inhale 2 puffs into the lungs 2 (two) times daily. 1 Inhaler 3 02/01/2018 at 1100  . Ascorbic Acid (VITAMIN C) 500 MG CAPS Take by mouth daily.   Not Taking at Unknown time  . guaiFENesin (MUCINEX) 600 MG 12 hr tablet Take 600 mg by mouth 2 (two) times daily.   Not Taking at Unknown time  . OXYGEN Inhale 2 L into the lungs continuous.   Taking    Assessment: Pharmacy consulted to dose heparin in this 82 year old male with Afib.  No prior anticoag noted. CrCl = 73.6 ml/min  Goal of Therapy:  Heparin level 0.3-0.7 units/ml Monitor platelets by anticoagulation protocol: Yes   Plan:  Will order  Heparin 5000 units IV X 1 bolus followed by heparin gtt to start at 1150 units/hr Will draw 1st HL 8  hrs after start of drip.  Will monitor CBC and HL daily.   Annabell Oconnor D 02/01/2018,6:58 PM

## 2018-02-01 NOTE — Progress Notes (Signed)
Family Meeting Note  Advance Directive:yes  Today a meeting took place with the Patient.  Patient is able to participate  The following clinical team members were present during this meeting:MD  The following were discussed:Patient's diagnosis: Acute on chronic respiratory failure, Patient's progosis: Unable to determine and Goals for treatment: Full Code  Additional follow-up to be provided: prn  Time spent during discussion:20 minutes  Bertrum Sol, MD

## 2018-02-01 NOTE — ED Notes (Signed)
Patient transported to X-ray 

## 2018-02-01 NOTE — ED Triage Notes (Signed)
Pt brought in via EMS. Reports SOB since yesterday. Sats 78% RA per EMS. BG 116/BP 148/64/HR 58/CO2 45 per EMS; placed on mask at 15L with EMS sats inc to 99%; A-fib per EMS. L fa 20g placed by EMS.

## 2018-02-01 NOTE — ED Notes (Signed)
Pt and family updated.

## 2018-02-01 NOTE — H&P (Signed)
[employer] [facility] [place] [facility]   PATIENT NAME: Scott Richard    MR#:  [id number]  DATE OF BIRTH:  08-03-1935  DATE OF ADMISSION:  02/01/2018  PRIMARY CARE PHYSICIAN: Lyndon Code, MD   REQUESTING/REFERRING PHYSICIAN:   CHIEF COMPLAINT:   Chief Complaint  Patient presents with  . Shortness of Breath    HISTORY OF PRESENT ILLNESS: Scott Richard  is a 82 y.o. male with a known history per below which includes chronic respiratory failure on 3 L at home, presenting from home with acute shortness of breath-worsening over the last 2 to 3 days, lower extremity swelling noted with no improvement despite medication use over the last year, noted productive cough, wheezing, inability to lay flat in bed, in the emergency room patient was found to have new onset A. fib, noted tachypnea, sodium 133, chloride 84, white count 11, chest x-ray noted for mild pulmonary vascular congestion/atelectasis, patient evaluated in the emergency room, no apparent distress, resting comfortably in bed, patient denies any pain/breaking out in sweats, palpitations, patient now be admitted for acute on chronic hypoxic hypercarbic respiratory failure most likely due to COPD and congestive heart failure exacerbation.  PAST MEDICAL HISTORY:   Past Medical History:  Diagnosis Date  . Arthritis   . CHF (congestive heart failure) (HCC)   . COPD (chronic obstructive pulmonary disease) (HCC)   . Hypertension     PAST SURGICAL HISTORY:  Past Surgical History:  Procedure Laterality Date  . NECK SURGERY    . TONSILLECTOMY      SOCIAL HISTORY:  Social History   Tobacco Use  . Smoking status: Former Smoker    Last attempt to quit: 2012    Years since quitting: 7.8  . Smokeless tobacco: Never Used  Substance Use Topics  . Alcohol use: No    FAMILY HISTORY:  Family History  Problem Relation Age of Onset  . Prostate cancer Neg Hx   . Bladder Cancer Neg Hx   . Kidney cancer Neg Hx      DRUG ALLERGIES: No Known Allergies  REVIEW OF SYSTEMS:   CONSTITUTIONAL: No fever, +fatigue, weakness.  EYES: No blurred or double vision.  EARS, NOSE, AND THROAT: No tinnitus or ear pain.  RESPIRATORY: + cough, shortness of breath, wheezing  CARDIOVASCULAR: No chest pain, + orthopnea, edema.  GASTROINTESTINAL: No nausea, vomiting, diarrhea or abdominal pain.  GENITOURINARY: No dysuria, hematuria.  ENDOCRINE: + polyuria, nocturia,  HEMATOLOGY: No anemia, easy bruising or bleeding SKIN: No rash or lesion. MUSCULOSKELETAL: No joint pain or arthritis.   NEUROLOGIC: No tingling, numbness, weakness.  PSYCHIATRY: No anxiety or depression.   MEDICATIONS AT HOME:  Prior to Admission medications   Medication Sig Start Date End Date Taking? Authorizing Provider  albuterol (PROVENTIL HFA;VENTOLIN HFA) 108 (90 Base) MCG/ACT inhaler Inhale 1-2 puffs into the lungs every 6 (six) hours as needed for wheezing or shortness of breath. 11/10/17  Yes Lyndon Code, MD  cetirizine (ZYRTEC) 10 MG tablet Take 10 mg by mouth daily.   Yes [provider]  Cholecalciferol (VITAMIN D-1000 MAX ST) 1000 units tablet Take 1 tablet by mouth daily.   Yes [provider]  fluticasone (FLONASE) 50 MCG/ACT nasal spray Place 2 sprays into both nostrils daily. Patient taking differently: Place 2 sprays into both nostrils 2 (two) times daily.  01/17/18  Yes Boscia, Kathlynn Grate, NP  furosemide (LASIX) 20 MG tablet Take 2 tabs for 3 days and then once a day  Patient taking differently: Take 20 mg by mouth daily.  05/19/17  Yes Lyndon Code, MD  ipratropium-albuterol (DUONEB) 0.5-2.5 (3) MG/3ML SOLN Take 3 mLs by nebulization every 6 (six) hours as needed (wheezing). 06/22/16  Yes Enid Baas, MD  meloxicam (MOBIC) 7.5 MG tablet Take 1 tablet (7.5 mg total) by mouth 2 (two) times daily as needed. 01/18/18  Yes Lyndon Code, MD  Multiple Vitamin (MULTIVITAMIN) capsule Take by mouth.   Yes [provider]  oxyCODONE (OXY IR/ROXICODONE) 5 MG immediate release tablet Take 1 tablet (5 mg total) by mouth every 6 (six) hours as needed for moderate pain. 06/22/16  Yes Enid Baas, MD  pramipexole (MIRAPEX) 0.5 MG tablet Take 1 tablet (0.5 mg total) by mouth 3 (three) times daily. 01/03/18  Yes [place], Coralee North, NP  SYMBICORT 160-4.5 MCG/ACT inhaler Inhale 2 puffs into the lungs 2 (two) times daily. 11/10/17  Yes Lyndon Code, MD  Ascorbic Acid (VITAMIN C) 500 MG CAPS Take by mouth daily.    [provider]  guaiFENesin (MUCINEX) 600 MG 12 hr tablet Take 600 mg by mouth 2 (two) times daily.    [provider]  OXYGEN Inhale 2 L into the lungs continuous.    [provider]      PHYSICAL EXAMINATION:   VITAL SIGNS: Blood pressure (!) 144/82, pulse 71, temperature 97.9 F (36.6 C), temperature source Oral, resp. rate (!) 22, height 5\' 9"  (1.753 m), weight 73 kg, SpO2 95 %.  GENERAL:  82 y.o.-year-old patient lying in the bed with no acute distress.  Frail-appearing, nontoxic-appearing EYES: Pupils equal, round, reactive to light and accommodation. No scleral icterus. Extraocular muscles intact.  HEENT: Head atraumatic, normocephalic. Oropharynx and nasopharynx clear.  NECK:  Supple, no jugular venous distention. No thyroid enlargement, no tenderness.  LUNGS: Normal breath sounds bilaterally, no wheezing, rales,rhonchi or crepitation. No use of accessory muscles of respiration.  CARDIOVASCULAR: S1, S2 normal. No murmurs, rubs, or gallops.  ABDOMEN: Soft, nontender, nondistended. Bowel sounds present. No organomegaly or mass.  EXTREMITIES: No pedal edema, cyanosis, or clubbing.  Diffuse muscular atrophy NEUROLOGIC: Cranial nerves II through XII are intact. MAES. Gait not checked.  PSYCHIATRIC: The patient is alert and oriented x 3.  SKIN: No obvious rash, lesion, or ulcer.   LABORATORY PANEL:   CBC Recent Labs  Lab 02/01/18 1242  WBC 11.0*  HGB  12.3*  HCT 39.5  PLT 201  MCV 95.4  MCH 29.7  MCHC 31.1  RDW 13.4  LYMPHSABS 0.8  MONOABS 1.5*  EOSABS 0.1  BASOSABS 0.1   ------------------------------------------------------------------------------------------------------------------  Chemistries  Recent Labs  Lab 02/01/18 1242  NA 133*  K 4.3  CL 84*  CO2 40*  GLUCOSE 102*  BUN 19  CREATININE 0.70  CALCIUM 9.3   ------------------------------------------------------------------------------------------------------------------ estimated creatinine clearance is 73.6 mL/min (by C-G formula based on SCr of 0.7 mg/dL). ------------------------------------------------------------------------------------------------------------------ No results for input(s): TSH, T4TOTAL, T3FREE, THYROIDAB in the last 72 hours.  Invalid input(s): FREET3   Coagulation profile No results for input(s): INR, PROTIME in the last 168 hours. ------------------------------------------------------------------------------------------------------------------- No results for input(s): DDIMER in the last 72 hours. -------------------------------------------------------------------------------------------------------------------  Cardiac Enzymes Recent Labs  Lab 02/01/18 1242  TROPONINI <0.03   ------------------------------------------------------------------------------------------------------------------ Invalid input(s): POCBNP  ---------------------------------------------------------------------------------------------------------------  Urinalysis    Component Value Date/Time   COLORURINE YELLOW (A) 08/31/2016 1838   APPEARANCEUR CLEAR (A) 08/31/2016 1838   LABSPEC 1.008 08/31/2016 1838   PHURINE 7.0 08/31/2016 1838  GLUCOSEU NEGATIVE 08/31/2016 1838   HGBUR NEGATIVE 08/31/2016 1838   BILIRUBINUR NEGATIVE 08/31/2016 1838   KETONESUR NEGATIVE 08/31/2016 1838   PROTEINUR NEGATIVE 08/31/2016 1838   NITRITE NEGATIVE 08/31/2016 1838    LEUKOCYTESUR NEGATIVE 08/31/2016 1838     RADIOLOGY: Dg Chest 2 View  Result Date: 02/01/2018 CLINICAL DATA:  Shortness of breath. EXAM: CHEST - 2 VIEW COMPARISON:  08/31/2016.  06/17/2016. FINDINGS: Mediastinum and hilar structures normal. Cardiomegaly with mild pulmonary venous congestion. Bibasilar atelectasis. Unchanged elevation right hemidiaphragm. Unchanged mild bowel distention. Prior cervicothoracic spine fusion. Hardware intact. IMPRESSION: 1.  Cardiomegaly with mild pulmonary venous congestion. 2. Persistent bibasilar atelectasis and elevation of the right hemidiaphragm. No interim change in appearance from prior exams. Electronically Signed   By: Maisie Fus  Register   On: 02/01/2018 13:43    EKG: Orders placed or performed during the hospital encounter of 02/01/18  . EKG 12-Lead  . EKG 12-Lead  . EKG 12-Lead  . EKG 12-Lead    IMPRESSION AND PLAN: *Acute on chronic hypoxic hypercarbic respiratory failure Most likely due to multifactorial process which includes acute on chronic congestive heart failure and COPD exacerbation Supplemental oxygen with weaning as tolerated Note patient on 3 L via nasal cannula continuous at home  *Acute on chronic congestive heart failure exacerbation Congestive heart failure protocol, IV Lasix twice daily, lisinopril, supplemental oxygen wean as tolerated, rule out acute coronary syndrome with cardiac enzymes x3 sets, Coreg, check echocardiogram, and continue close medical monitoring  *Acute new onset A. Fib Start heparin drip for now, Coreg twice daily, cardiology to see, check echocardiogram  *Acute on COPD exacerbation IV Solu-Medrol with tapering as tolerated, inhaled corticosteroids twice daily, aggressive pulmonary toilet bronchodilator therapy, mucolytic agents, respiratory therapy to see, check sputum cultures, empiric azithromycin for 5-day course  *Acute on chronic malnutrition Dietary consult if expert opinion  *Chronic  hypertension Lasix, Coreg, vitals per routine, make changes as per necessary   All the records are reviewed and case discussed with ED provider. Management plans discussed with the patient, family and they are in agreement.  CODE STATUS:full Code Status History    Date Active Date Inactive Code Status Order ID Comments User Context   06/17/2016 2320 06/22/2016 1628 Full Code [id number]  Gwendolyn Fill, NP ED       TOTAL TIME TAKING CARE OF THIS PATIENT: 40 minutes.    Evelena Asa Everline Mahaffy M.D on 02/01/2018   Between 7am to 6pm - Pager - [phone]  After 6pm go to [email or web address] - password [email or web address] [facility]  [facility]  Office  [phone]  CC: Primary care physician; Lyndon Code, MD   Note: This dictation was prepared with Dragon dictation along with smaller phrase technology. Any transcriptional errors that result from this process are unintentional.

## 2018-02-01 NOTE — ED Provider Notes (Addendum)
[facility] Emergency Department Provider Note ____________________________________________   I have reviewed the triage vital signs and the triage nursing note.  HISTORY  Chief Complaint Shortness of Breath   Historian Patient  HPI Scott Richard is a 82 y.o. male with a history of COPD and CHF presenting from home where he wears home O2 but is not sure how much, states that he has been short of breath for about 2 days.  Is a bit of a poor historian.  No chest pain.  States it may have had a subjective fever a day or so ago.  No nausea or vomiting.  No abdominal pain.  No diarrhea.  Shortness of breath is moderate and worse when he is upright.  He does not feel he has been wheezing.     Past Medical History:  Diagnosis Date  . Arthritis   . CHF (congestive heart failure) (HCC)   . COPD (chronic obstructive pulmonary disease) (HCC)   . Hypertension     Patient Active Problem List   Diagnosis Date Noted  . Primary generalized (osteo)arthritis 04/19/2017  . Weakness 04/19/2017  . Difficulty in walking, not elsewhere classified 04/19/2017  . Chronic systolic (congestive) heart failure (HCC) 04/19/2017  . Dependence on supplemental oxygen 04/19/2017  . Edema, unspecified 04/19/2017  . Cor pulmonale (chronic) (HCC) 04/19/2017  . Chronic pain syndrome 04/19/2017  . Hypercalcemia 04/19/2017  . Occlusion and stenosis of bilateral carotid arteries 04/19/2017  . Tremor, unspecified 04/19/2017  . Nicotine dependence, cigarettes, in remission 04/19/2017  . Allergic rhinitis, unspecified 04/19/2017  . Shortness of breath 04/19/2017  . Emphysema, unspecified (HCC) 04/19/2017  . Dysuria 04/19/2017  . Mixed hyperlipidemia 04/19/2017  . Generalized anxiety disorder 04/19/2017  . Cough 04/19/2017  . Chronic respiratory failure with hypoxia and hypercapnia (HCC) 10/27/2016  . Essential hypertension 10/22/2016  . CHF exacerbation (HCC) 10/22/2016  . Hypoxia  10/22/2016  . COPD exacerbation (HCC) 06/17/2016    Past Surgical History:  Procedure Laterality Date  . NECK SURGERY    . TONSILLECTOMY      Prior to Admission medications   Medication Sig Start Date End Date Taking? Authorizing Provider  albuterol (PROVENTIL HFA;VENTOLIN HFA) 108 (90 Base) MCG/ACT inhaler Inhale 1-2 puffs into the lungs every 6 (six) hours as needed for wheezing or shortness of breath. 11/10/17   Lyndon Code, MD  Ascorbic Acid (VITAMIN C) 500 MG CAPS Take by mouth daily.    [provider]  cetirizine (ZYRTEC) 10 MG tablet Take 10 mg by mouth daily.    [provider]  Cholecalciferol (VITAMIN D-1000 MAX ST) 1000 units tablet Take 1 tablet by mouth daily.    [provider]  fluticasone (FLONASE) 50 MCG/ACT nasal spray Place 2 sprays into both nostrils daily. 01/17/18   Carlean Jews, NP  furosemide (LASIX) 20 MG tablet Take 2 tabs for 3 days and then once a day 05/19/17   Lyndon Code, MD  guaiFENesin (MUCINEX) 600 MG 12 hr tablet Take 600 mg by mouth 2 (two) times daily.    [provider]  ipratropium-albuterol (DUONEB) 0.5-2.5 (3) MG/3ML SOLN Take 3 mLs by nebulization every 6 (six) hours as needed (wheezing). 06/22/16   Enid Baas, MD  meloxicam (MOBIC) 7.5 MG tablet Take 1 tablet (7.5 mg total) by mouth 2 (two) times daily as needed. 01/18/18   Lyndon Code, MD  Multiple Vitamin (MULTIVITAMIN) capsule Take by mouth.    [provider]  oxyCODONE (OXY IR/ROXICODONE) 5 MG immediate release tablet Take 1 tablet (5 mg total) by mouth every 6 (six) hours as needed for moderate pain. 06/22/16   Enid Baas, MD  OXYGEN Inhale 2 L into the lungs continuous.    [provider]  pramipexole (MIRAPEX) 0.5 MG tablet Take 1 tablet (0.5 mg total) by mouth 3 (three) times daily. 01/03/18   Johnna Acosta, NP  predniSONE (DELTASONE) 20 MG tablet Take 2 tablets (40 mg total) by mouth daily. 07/28/16   Phineas Semen, MD  propranolol (INDERAL) 10 MG tablet Take 1 tablet (10 mg total) by mouth 3 (three) times daily. 06/22/16   Enid Baas, MD  SYMBICORT 160-4.5 MCG/ACT inhaler Inhale 2 puffs into the lungs 2 (two) times daily. 11/10/17   Lyndon Code, MD    No Known Allergies  Family History  Problem Relation Age of Onset  . Prostate cancer Neg Hx   . Bladder Cancer Neg Hx   . Kidney cancer Neg Hx     Social History Social History   Tobacco Use  . Smoking status: Former Smoker    Last attempt to quit: 2012    Years since quitting: 7.8  . Smokeless tobacco: Never Used  Substance Use Topics  . Alcohol use: No  . Drug use: No    Review of Systems  Constitutional: Negative for fever. Eyes: Negative for visual changes. ENT: Negative for sore throat. Cardiovascular: Negative for chest pain. Respiratory: Positive for shortness of breath.  No sputum production. Gastrointestinal: Negative for abdominal pain, vomiting and diarrhea. Genitourinary: Negative for dysuria. Musculoskeletal: Negative for back pain. Skin: Negative for rash. Neurological: Negative for headache.  ____________________________________________   PHYSICAL EXAM:  VITAL SIGNS: ED Triage Vitals  Enc Vitals Group     BP 02/01/18 1157 (!) 159/80     Pulse Rate 02/01/18 1157 61     Resp 02/01/18 1157 (!) 28     Temp 02/01/18 1204 97.9 F (36.6 C)     Temp Source 02/01/18 1204 Oral     SpO2 02/01/18 1157 100 %     Weight 02/01/18 1205 161 lb (73 kg)     Height 02/01/18 1205 5\' 9"  (1.753 m)     Head Circumference --      Peak Flow --      Pain Score 02/01/18 1205 0     Pain Loc --      Pain Edu? --      Excl. in GC? --      Constitutional: Alert and oriented.  HEENT      Head: Normocephalic and atraumatic.      Eyes: Conjunctivae are normal. Pupils equal and round.       Ears:         Nose: No congestion/rhinnorhea.      Mouth/Throat: Mucous membranes are moist.      Neck: No  stridor. Cardiovascular/Chest: Normal rate, irregularly irregular rhythm.  No murmurs, rubs, or gallops. Respiratory: Normal respiratory effort without tachypnea nor retractions.  Some tight breath sounds and mild wheezing without obvious rhonchi or rales.   Gastrointestinal: Soft. No distention, no guarding, no rebound. Nontender.    Genitourinary/rectal:Deferred Musculoskeletal: Nontender with normal range of motion in all extremities. No joint effusions.  No lower extremity tenderness.  No edema. Neurologic:  Normal speech and language. No gross or focal neurologic deficits are appreciated. Skin:  Skin is warm, dry and intact. No rash noted. Psychiatric: Mood and affect are  normal. Speech and behavior are normal. Patient exhibits appropriate insight and judgment.   ____________________________________________  LABS (pertinent positives/negatives) I, Governor Rooks, MD the attending physician have reviewed the labs noted below.  Labs Reviewed  BASIC METABOLIC PANEL - Abnormal; Notable for the following components:      Result Value   Sodium 133 (*)    Chloride 84 (*)    CO2 40 (*)    Glucose, Bld 102 (*)    All other components within normal limits  CBC WITH DIFFERENTIAL/PLATELET - Abnormal; Notable for the following components:   WBC 11.0 (*)    RBC 4.14 (*)    Hemoglobin 12.3 (*)    Neutro Abs 8.4 (*)    Monocytes Absolute 1.5 (*)    All other components within normal limits  TROPONIN I    ____________________________________________    EKG I, Governor Rooks, MD, the attending physician have personally viewed and interpreted all ECGs.  70 bpm.  Irregularly irregular, most likely atrial fibrillation.  Nonspecific intraventricular conduction delay.  Likely left axis deviation. ____________________________________________  RADIOLOGY   CXR 2 view:  IMPRESSION: 1.  Cardiomegaly with mild pulmonary venous congestion.  2. Persistent bibasilar atelectasis and elevation of  the right hemidiaphragm. No interim change in appearance from prior exams. __________________________________________  PROCEDURES  Procedure(s) performed: None  Procedures  Critical Care performed: None   ____________________________________________  ED COURSE / ASSESSMENT AND PLAN  Pertinent labs & imaging results that were available during my care of the patient were reviewed by me and considered in my medical decision making (see chart for details).    Patient presenting with shortness of breath sounds like probably over the past 2 days and with increased O2 requirement.  70% on room air.  He is requiring 4 L to maintain about 93-94% saturations here.  He has a history of CHF and COPD reportedly.  I reviewed his past notes and there is a hospitalization from 2018 which was a COPD exacerbation.  He has tight breath sounds without real obvious wheezing but I will try DuoNeb treatment here.  His legs are edematous, and equal, doubt DVT or PE clinically, but concern for CHF exacerbation.    EKG here is irregularly irregular and looks like is probably atrial fibrillation.  When I looked back at prior EKGs, he has been in sinus rhythm, and then he has had at least one that was irregularly irregular questionable for atrial fibrillation but by notes that I looked up, he is not documented as a history of A. fib.    CONSULTATIONS:   Hospitalist for admission   Patient / Family / Caregiver informed of clinical course, medical decision-making process, and agree with plan.    ___________________________________________   FINAL CLINICAL IMPRESSION(S) / ED DIAGNOSES   Final diagnoses:  Dyspnea, unspecified type  Hypoxia  Acute congestive heart failure, unspecified heart failure type (HCC)  Chronic obstructive pulmonary disease with acute exacerbation (HCC)  Atrial fibrillation, unspecified type (HCC)      ___________________________________________         Note:  This dictation was prepared with Dragon dictation. Any transcriptional errors that result from this process are unintentional    Governor Rooks, MD 02/01/18 1535    Governor Rooks, MD 02/01/18 [phone]

## 2018-02-01 NOTE — ED Notes (Signed)
On heart monitor at 11:52; labs sent 11:54; EKG 11:56; switched to 5L on [place] at 11:56.

## 2018-02-02 ENCOUNTER — Inpatient Hospital Stay (HOSPITAL_COMMUNITY)
Admit: 2018-02-02 | Discharge: 2018-02-02 | Disposition: A | Discharge status: Home / Self Care | Payer: Medicare HMO | Attending: Family Medicine | Admitting: Family Medicine

## 2018-02-02 DIAGNOSIS — L8990 Pressure ulcer of unspecified site, unspecified stage: Secondary | ICD-10-CM

## 2018-02-02 DIAGNOSIS — I5020 Unspecified systolic (congestive) heart failure: Secondary | ICD-10-CM

## 2018-02-02 LAB — CBC
HEMATOCRIT: 37.7 % — AB (ref 39.0–52.0)
Hemoglobin: 12 g/dL — ABNORMAL LOW (ref 13.0–17.0)
MCH: 29.3 pg (ref 26.0–34.0)
MCHC: 31.8 g/dL (ref 30.0–36.0)
MCV: 92.2 fL (ref 80.0–100.0)
NRBC: 0 % (ref 0.0–0.2)
PLATELETS: 183 10*3/uL (ref 150–400)
RBC: 4.09 MIL/uL — AB (ref 4.22–5.81)
RDW: 13.3 % (ref 11.5–15.5)
WBC: 8.1 10*3/uL (ref 4.0–10.5)

## 2018-02-02 LAB — BASIC METABOLIC PANEL
Anion gap: 13 (ref 5–15)
BUN: 18 mg/dL (ref 8–23)
CHLORIDE: 83 mmol/L — AB (ref 98–111)
CO2: 38 mmol/L — AB (ref 22–32)
Calcium: 8.6 mg/dL — ABNORMAL LOW (ref 8.9–10.3)
Creatinine, Ser: 0.55 mg/dL — ABNORMAL LOW (ref 0.61–1.24)
GFR calc Af Amer: >60 mL/min (ref >60)
GFR calc non Af Amer: >60 mL/min (ref >60)
Glucose, Bld: 141 mg/dL — ABNORMAL HIGH (ref 70–99)
POTASSIUM: 4.1 mmol/L (ref 3.5–5.1)
SODIUM: 134 mmol/L — AB (ref 135–145)

## 2018-02-02 LAB — ECHOCARDIOGRAM COMPLETE
Height: 69 in
WEIGHTICAEL: 2698.43 [oz_av]

## 2018-02-02 LAB — HEPARIN LEVEL (UNFRACTIONATED): Heparin Unfractionated: 0.58 IU/mL (ref 0.30–0.70)

## 2018-02-02 LAB — TROPONIN I
Troponin I: 0.03 ng/mL (ref <0.03)
Troponin I: 0.03 ng/mL (ref <0.03)

## 2018-02-02 MED ORDER — ENSURE ENLIVE PO LIQD
237.0000 mL | Freq: Three times a day (TID) | ORAL | Status: DC
  Administered 2018-02-02 – 2018-02-04 (×3): 237 mL via ORAL

## 2018-02-02 MED ORDER — ENOXAPARIN SODIUM 40 MG/0.4ML ~~LOC~~ SOLN
40.0000 mg | SUBCUTANEOUS | Status: DC
  Administered 2018-02-02 – 2018-02-03 (×2): 40 mg via SUBCUTANEOUS
  Filled 2018-02-02 (×2): qty 0.4

## 2018-02-02 NOTE — Progress Notes (Signed)
4th troponin 0.03 positive, Dr. Anne Hahn notified via text and did not received any new order. Patient is asymptomatic on reassessmet. Heparin running at 11.5 ml/hr, no bleeding or hematoma noted. Will continue to monitor.

## 2018-02-02 NOTE — Progress Notes (Signed)
[facility] - [place] at [facility]   PATIENT NAME: Scott Richard    MR#:  [id number]  DATE OF BIRTH:  08-26-1935  SUBJECTIVE:  CHIEF COMPLAINT: Shortness of breath is better Very poor historian  REVIEW OF SYSTEMS:  CONSTITUTIONAL: No fever, fatigue or weakness.  EYES: No blurred or double vision.  EARS, NOSE, AND THROAT: No tinnitus or ear pain.  RESPIRATORY: No cough, improving shortness of breath, denies wheezing or hemoptysis.  CARDIOVASCULAR: No chest pain, orthopnea, edema.  GASTROINTESTINAL: No nausea, vomiting, diarrhea or abdominal pain.  GENITOURINARY: No dysuria, hematuria.  ENDOCRINE: No polyuria, nocturia,  HEMATOLOGY: No anemia, easy bruising or bleeding SKIN: No rash or lesion. MUSCULOSKELETAL: No joint pain or arthritis.   NEUROLOGIC: No tingling, numbness, weakness.  PSYCHIATRY: No anxiety or depression.   DRUG ALLERGIES:  No Known Allergies  VITALS:  Blood pressure (!) 98/50, pulse 78, temperature 97.6 F (36.4 C), temperature source Oral, resp. rate (!) 21, height 5\' 9"  (1.753 m), weight 76.5 kg, SpO2 92 %.  PHYSICAL EXAMINATION:  GENERAL:  82 y.o.-year-old patient lying in the bed with no acute distress.  EYES: Pupils equal, round, reactive to light and accommodation. No scleral icterus. Extraocular muscles intact.  HEENT: Head atraumatic, normocephalic. Oropharynx and nasopharynx clear.  NECK:  Supple, no jugular venous distention. No thyroid enlargement, no tenderness.  LUNGS: Normal breath sounds bilaterally, no wheezing, rales,rhonchi or crepitation. No use of accessory muscles of respiration.  CARDIOVASCULAR: Irregularly regular no murmurs  aBDOMEN: Soft, nontender, nondistended. Bowel sounds present.  EXTREMITIES: No pedal edema, cyanosis, or clubbing.  NEUROLOGIC: Cranial nerves II through XII are intact. Muscle strength 5/5 in all extremities. Sensation intact. Gait not checked.  PSYCHIATRIC: The patient is alert and  oriented x 3.  SKIN: No obvious rash, lesion, or ulcer.    LABORATORY PANEL:   CBC Recent Labs  Lab 02/02/18 0545  WBC 8.1  HGB 12.0*  HCT 37.7*  PLT 183   ------------------------------------------------------------------------------------------------------------------  Chemistries  Recent Labs  Lab 02/02/18 0545  NA 134*  K 4.1  CL 83*  CO2 38*  GLUCOSE 141*  BUN 18  CREATININE 0.55*  CALCIUM 8.6*   ------------------------------------------------------------------------------------------------------------------  Cardiac Enzymes Recent Labs  Lab 02/02/18 0545  TROPONINI 0.03*   ------------------------------------------------------------------------------------------------------------------  RADIOLOGY:  Dg Chest 2 View  Result Date: 02/01/2018 CLINICAL DATA:  Shortness of breath. EXAM: CHEST - 2 VIEW COMPARISON:  08/31/2016.  06/17/2016. FINDINGS: Mediastinum and hilar structures normal. Cardiomegaly with mild pulmonary venous congestion. Bibasilar atelectasis. Unchanged elevation right hemidiaphragm. Unchanged mild bowel distention. Prior cervicothoracic spine fusion. Hardware intact. IMPRESSION: 1.  Cardiomegaly with mild pulmonary venous congestion. 2. Persistent bibasilar atelectasis and elevation of the right hemidiaphragm. No interim change in appearance from prior exams. Electronically Signed   By: Maisie Fus  Register   On: 02/01/2018 13:43    EKG:   Orders placed or performed during the hospital encounter of 02/01/18  . EKG 12-Lead  . EKG 12-Lead  . EKG 12-Lead  . EKG 12-Lead    ASSESSMENT AND PLAN:    *Acute on chronic hypoxic hypercarbic respiratory failure Most likely due to multifactorial process which includes acute on chronic congestive heart failure and COPD exacerbation Clinically better today  Supplemental oxygen with weaning as tolerated Note patient on 3 L via nasal cannula continuous at home  *Acute on chronic congestive heart  failure exacerbation Congestive heart failure protocol IV Lasix twice daily, lisinopril supplemental oxygen wean as tolerated Acute MI ruled out  with negative troponins Echocardiogram [facility] cardiology is following  *Acute new onset A. Fib Discontinue heparin drip-as per cardiology recommendations Continue Coreg twice daily for rate control Patient needs to follow-up with primary care physician Dr. Park Breed regarding the anticoagulation  *Acute on COPD exacerbation Clinically better IV Solu-Medrol with tapering as tolerated  inhaled corticosteroids twice daily, aggressive pulmonary toilet bronchodilator therapy, mucolytic agents, respiratory therapy to see  check sputum cultures if patient can provide a sample  empiric azithromycin for 5-day course  *Acute on chronic malnutrition Dietary consult for expert opinion  *Chronic hypertension Currently hypotensive Lasix, Coreg, vitals per routine with holding parameters    All the records are reviewed and case discussed with Care Management/Social Workerr. Management plans discussed with the patient, family and they are in agreement.  CODE STATUS: fc   TOTAL TIME TAKING CARE OF THIS PATIENT: 36 minutes.   POSSIBLE D/C IN 2 DAYS, DEPENDING ON CLINICAL CONDITION.  Note: This dictation was prepared with Dragon dictation along with smaller phrase technology. Any transcriptional errors that result from this process are unintentional.   Ramonita Lab M.D on 02/02/2018 at 3:42 PM  Between 7am to 6pm - Pager - [phone] After 6pm go to [email or web address] - password [email or web address] [facility]  [place] [facility]  Office  [phone]  CC: Primary care physician; Lyndon Code, MD

## 2018-02-02 NOTE — Progress Notes (Signed)
*  PRELIMINARY RESULTS* Echocardiogram 2D Echocardiogram has been performed.  Scott Richard 02/02/2018, 11:29 AM

## 2018-02-02 NOTE — Consult Note (Signed)
[facility]  CARDIOLOGY CONSULT NOTE  Patient ID: Scott Richard MRN: [id number] DOB/AGE: September 12, 1935 82 y.o.  Admit date: 02/01/2018 Referring Physician Gouru Primary Physician Beverely Risen, MD Primary Cardiologist None per patient Reason for Consultation acute on chronic CHF, atrial fibrillation  HPI: 82 year old male referred for evaluation of new onset congestive heart failure and atrial fibrillation.  The patient has a history of COPD on 3 L of supplemental oxygen at home, hypertension, and CHF, unknown type.  The patient presented to [facility] ER for a 2 to 3-day history of progressive shortness of breath and lower extremity edema that was refractory to diuretic, productive cough, wheezing, and orthopnea.  ECG revealed atrial fibrillation at a rate of 70 bpm.  Labs notable for WBC 11.0, sodium 133, chloride 84, CO2 40, troponin 0.03, followed by 0.03. Upon review of previous ECGs, the patient was noted to be in atrial fibrillation on 07/28/2016.  The patient was started on heparin drip and IV Lasix. Chest x-ray revealed cardiomegaly with mild pulmonary venous congestion. The patient is a poor historian. He states that he feels much better, back to his baseline. He denies chest pain. He states that his breathing is back to baseline. He denies palpitations. He denies a known history of atrial fibrillation, but states he thinks he was on a blood thinner before. Currently, the patient is in bed eating lunch.  Review of systems complete and found to be negative unless listed above     Past Medical History:  Diagnosis Date  . Arthritis   . CHF (congestive heart failure) (HCC)   . COPD (chronic obstructive pulmonary disease) (HCC)   . Hypertension     Past Surgical History:  Procedure Laterality Date  . NECK SURGERY    . TONSILLECTOMY      Medications Prior to Admission  Medication Sig Dispense Refill Last Dose  . albuterol (PROVENTIL HFA;VENTOLIN HFA) 108 (90 Base) MCG/ACT inhaler  Inhale 1-2 puffs into the lungs every 6 (six) hours as needed for wheezing or shortness of breath. 1 Inhaler 3 prn at prn  . cetirizine (ZYRTEC) 10 MG tablet Take 10 mg by mouth daily.   prn at prn  . Cholecalciferol (VITAMIN D-1000 MAX ST) 1000 units tablet Take 1 tablet by mouth daily.   02/01/2018 at Unknown time  . fluticasone (FLONASE) 50 MCG/ACT nasal spray Place 2 sprays into both nostrils daily. (Patient taking differently: Place 2 sprays into both nostrils 2 (two) times daily. ) 16 g 3 02/01/2018 at 1000  . furosemide (LASIX) 20 MG tablet Take 2 tabs for 3 days and then once a day (Patient taking differently: Take 20 mg by mouth daily. ) 45 tablet 6 02/01/2018 at 1000  . ipratropium-albuterol (DUONEB) 0.5-2.5 (3) MG/3ML SOLN Take 3 mLs by nebulization every 6 (six) hours as needed (wheezing). 360 mL 0 prn at prn  . meloxicam (MOBIC) 7.5 MG tablet Take 1 tablet (7.5 mg total) by mouth 2 (two) times daily as needed. 60 tablet 0  at prn  . Multiple Vitamin (MULTIVITAMIN) capsule Take by mouth.   Unknown at Unknown  . oxyCODONE (OXY IR/ROXICODONE) 5 MG immediate release tablet Take 1 tablet (5 mg total) by mouth every 6 (six) hours as needed for moderate pain. 20 tablet 0 prn at prn  . pramipexole (MIRAPEX) 0.5 MG tablet Take 1 tablet (0.5 mg total) by mouth 3 (three) times daily. 90 tablet 0 02/01/2018 at 1000  . SYMBICORT 160-4.5 MCG/ACT inhaler Inhale 2 puffs into  the lungs 2 (two) times daily. 1 Inhaler 3 02/01/2018 at 1100  . Ascorbic Acid (VITAMIN C) 500 MG CAPS Take by mouth daily.   Not Taking at Unknown time  . guaiFENesin (MUCINEX) 600 MG 12 hr tablet Take 600 mg by mouth 2 (two) times daily.   Not Taking at Unknown time  . OXYGEN Inhale 2 L into the lungs continuous.   Taking   Social History   Socioeconomic History  . Marital status: Divorced    Spouse name: Not on file  . Number of children: Not on file  . Years of education: Not on file  . Highest education level: Not on file   Occupational History  . Not on file  Social Needs  . Financial resource strain: Not on file  . Food insecurity:    Worry: Not on file    Inability: Not on file  . Transportation needs:    Medical: Not on file    Non-medical: Not on file  Tobacco Use  . Smoking status: Former Smoker    Last attempt to quit: 2012    Years since quitting: 7.8  . Smokeless tobacco: Never Used  Substance and Sexual Activity  . Alcohol use: No  . Drug use: No  . Sexual activity: Not on file  Lifestyle  . Physical activity:    Days per week: Not on file    Minutes per session: Not on file  . Stress: Not on file  Relationships  . Social connections:    Talks on phone: Not on file    Gets together: Not on file    Attends religious service: Not on file    Active member of club or organization: Not on file    Attends meetings of clubs or organizations: Not on file    Relationship status: Not on file  . Intimate partner violence:    Fear of current or ex partner: Not on file    Emotionally abused: Not on file    Physically abused: Not on file    Forced sexual activity: Not on file  Other Topics Concern  . Not on file  Social History Narrative  . Not on file    Family History  Problem Relation Age of Onset  . Prostate cancer Neg Hx   . Bladder Cancer Neg Hx   . Kidney cancer Neg Hx       Review of systems complete and found to be negative unless listed above      PHYSICAL EXAM  General: Well developed, well nourished, in no acute distress, sitting up in bed eating lunch HEENT:  Normocephalic and atramatic. Poor dentition. Neck:  No JVD.  Lungs: Course breath sounds, no wheezing or crackles, slightly diminished breath sounds in bases. Nasal cannula. Heart: Irregularly irregular Abdomen: nondistended Msk:  Back normal, gait not assessed. Normal strength and tone for age. Extremities: Mild bilateral pretibial edema. Neuro: Alert and oriented X 3. Psych:  Good affect, responds  appropriately  Labs:   Lab Results  Component Value Date   WBC 8.1 02/02/2018   HGB 12.0 (L) 02/02/2018   HCT 37.7 (L) 02/02/2018   MCV 92.2 02/02/2018   PLT 183 02/02/2018    Recent Labs  Lab 02/02/18 0545  NA 134*  K 4.1  CL 83*  CO2 38*  BUN 18  CREATININE 0.55*  CALCIUM 8.6*  GLUCOSE 141*   Lab Results  Component Value Date   TROPONINI 0.03 (HH) 02/02/2018   No  results found for: CHOL No results found for: HDL No results found for: LDLCALC No results found for: TRIG No results found for: CHOLHDL No results found for: LDLDIRECT    Radiology: Dg Chest 2 View  Result Date: 02/01/2018 CLINICAL DATA:  Shortness of breath. EXAM: CHEST - 2 VIEW COMPARISON:  08/31/2016.  06/17/2016. FINDINGS: Mediastinum and hilar structures normal. Cardiomegaly with mild pulmonary venous congestion. Bibasilar atelectasis. Unchanged elevation right hemidiaphragm. Unchanged mild bowel distention. Prior cervicothoracic spine fusion. Hardware intact. IMPRESSION: 1.  Cardiomegaly with mild pulmonary venous congestion. 2. Persistent bibasilar atelectasis and elevation of the right hemidiaphragm. No interim change in appearance from prior exams. Electronically Signed   By: Maisie Fus  Register   On: 02/01/2018 13:43    EKG: Atrial fibrillation rate 79 bpm  ASSESSMENT AND PLAN:  1.  Acute on chronic hypoxic hypercapnic respiratory failure, likely multifactorial with COPD exacerbation and acute on chronic congestive heart failure, unknown type. 2.  Atrial fibrillation, rate controlled, started on heparin drip.  Upon review of prior ECGs, the patient was noted to be in atrial fibrillation on 07/2016, but cannot recall this diagnosis.  3.  Acute on chronic COPD exacerbation, much improved 4.  Hypertension  Recommendations: 1.  Agree with overall therapy 2.  Continue to cycle cardiac enzymes 3.  Review 2D echocardiogram 4.  IV Lasix with careful monitoring of renal status 5.  Continue carvedilol for  rate control 6.  Discontinue heparin drip 7.  Further recommendations pending patient's initial course and results of 2D echocardiogram  Signed: Leanora Ivanoff PA-C 02/02/2018, 8:44 AM

## 2018-02-02 NOTE — Progress Notes (Signed)
Initial Nutrition Assessment  DOCUMENTATION CODES:   Severe malnutrition in context of chronic illness  INTERVENTION:  Will downgrade diet to dysphagia 3 with thin liquids in setting of poor dentition.  Provide Ensure Enlive po TID, each supplement provides 350 kcal and 20 grams of protein.  Provide Magic cup TID with meals, each supplement provides 290 kcal and 9 grams of protein.  Continue daily MVI.  NUTRITION DIAGNOSIS:   Severe Malnutrition related to chronic illness(COPD, CHF) as evidenced by severe fat depletion, severe muscle depletion.  GOAL:   Patient will meet greater than or equal to 90% of their needs  MONITOR:   PO intake, Supplement acceptance, Labs, Weight trends, Skin, I & O's  REASON FOR ASSESSMENT:   Consult Assessment of nutrition requirement/status  ASSESSMENT:   82 year old male with PMHx of COPD, HTN, arthritis, CHF who is admitted with acute on chronic hypoxic hypercarbic respiratory failure, acute on chronic CHF, acute exacerbation of COPD, new onset A-fib.   Met with patient at bedside. He is not the best historian. He reports his appetite is "okay." He lives with his sister at home. He reports he does not usually eat true meals during the day. He drinks Boost 1-2 bottles per day and may have some pinto beans. Noted poor dentition (pt only has 3 teeth). He reports difficulty chewing and is amenable to having food chopped.   Patient reports his UBW is 165 lbs (75 kg). Per chart he has been 69.9-74.2 kg the past year. He is currently 76.5 kg (168.65 lbs).  Meal Completion: 60% of dinner last night  Medications reviewed and include: azithromycin, vitamin D 1000 units daily, Lasix 20 mg BID IV, Solu-Medrol 60 mg Q6hrs IV, MVI daily, vitamin C 500 mg daily.  Labs reviewed: Sodium 134, Chloride 83, CO2 38, Creatinine 0.55.  NUTRITION - FOCUSED PHYSICAL EXAM:    Most Recent Value  Orbital Region  Severe depletion  Upper Arm Region  Severe depletion   Thoracic and Lumbar Region  Severe depletion  Buccal Region  Severe depletion  Temple Region  Severe depletion  Clavicle Bone Region  Severe depletion  Clavicle and Acromion Bone Region  Severe depletion  Scapular Bone Region  Severe depletion  Dorsal Hand  Severe depletion  Patellar Region  Unable to assess  Anterior Thigh Region  Unable to assess  Posterior Calf Region  Unable to assess  Edema (RD Assessment)  Moderate  Hair  Reviewed  Eyes  Reviewed  Mouth  Reviewed [poor dentition]  Skin  Reviewed  Nails  Reviewed     Diet Order:   Diet Order            Diet Heart Room service appropriate? Yes; Fluid consistency: Thin  Diet effective now              EDUCATION NEEDS:   No education needs have been identified at this time  Skin:  Skin Assessment: Skin Integrity Issues:(stage I right ischial tuberosity)  Last BM:  PTA (01/31/2018 per chart)  Height:   Ht Readings from Last 1 Encounters:  02/01/18 _0  (1.753 m)    Weight:   Wt Readings from Last 1 Encounters:  02/02/18 76.5 kg    Ideal Body Weight:  72.7 kg  BMI:  Body mass index is 24.91 kg/m.  Estimated Nutritional Needs:   Kcal:  1765-2060 (MSJ x 1.2-1.4)  Protein:  90-105 grams (1.2-1.4 grams/kg)  Fluid:  1.5-1.7 L/day  Willey Blade, MS, RD, LDN  Office: [phone] Pager: [phone] After Hours/Weekend Pager: [phone]

## 2018-02-03 DIAGNOSIS — E43 Unspecified severe protein-calorie malnutrition: Secondary | ICD-10-CM

## 2018-02-03 LAB — BASIC METABOLIC PANEL
Anion gap: 8 (ref 5–15)
BUN: 26 mg/dL — AB (ref 8–23)
CHLORIDE: 80 mmol/L — AB (ref 98–111)
CO2: 42 mmol/L — ABNORMAL HIGH (ref 22–32)
Calcium: 9.1 mg/dL (ref 8.9–10.3)
Creatinine, Ser: 0.69 mg/dL (ref 0.61–1.24)
GFR calc Af Amer: >60 mL/min (ref >60)
GFR calc non Af Amer: >60 mL/min (ref >60)
GLUCOSE: 131 mg/dL — AB (ref 70–99)
POTASSIUM: 3.9 mmol/L (ref 3.5–5.1)
Sodium: 130 mmol/L — ABNORMAL LOW (ref 135–145)

## 2018-02-03 MED ORDER — GUAIFENESIN-DM 100-10 MG/5ML PO SYRP
5.0000 mL | ORAL_SOLUTION | ORAL | Status: DC | PRN
  Administered 2018-02-03: 5 mL via ORAL
  Filled 2018-02-03: qty 5

## 2018-02-03 MED ORDER — METHYLPREDNISOLONE SODIUM SUCC 40 MG IJ SOLR
40.0000 mg | Freq: Two times a day (BID) | INTRAMUSCULAR | Status: DC
  Administered 2018-02-03 – 2018-02-04 (×2): 40 mg via INTRAVENOUS
  Filled 2018-02-03 (×2): qty 1

## 2018-02-03 MED ORDER — CARVEDILOL 3.125 MG PO TABS
3.1250 mg | ORAL_TABLET | Freq: Two times a day (BID) | ORAL | Status: DC
  Administered 2018-02-03 – 2018-02-04 (×2): 3.125 mg via ORAL
  Filled 2018-02-03 (×2): qty 1

## 2018-02-03 NOTE — Plan of Care (Signed)
  Problem: Elimination: Goal: Will not experience complications related to bowel motility Outcome: Completed/Met   Problem: Pain Managment: Goal: General experience of comfort will improve Outcome: Completed/Met

## 2018-02-03 NOTE — Progress Notes (Signed)
[facility] - [place] at [facility]   PATIENT NAME: Scott Richard    MR#:  [id number]  DATE OF BIRTH:  1935-06-18  SUBJECTIVE:  CHIEF COMPLAINT: Shortness of breath is better, denies any palpitations  he is hard of hearing without hearing aid  REVIEW OF SYSTEMS:  CONSTITUTIONAL: No fever, fatigue or weakness.  EYES: No blurred or double vision.  EARS, NOSE, AND THROAT: No tinnitus or ear pain.  RESPIRATORY: No cough, improving shortness of breath, denies wheezing or hemoptysis.  CARDIOVASCULAR: No chest pain, orthopnea, edema.  GASTROINTESTINAL: No nausea, vomiting, diarrhea or abdominal pain.  GENITOURINARY: No dysuria, hematuria.  ENDOCRINE: No polyuria, nocturia,  HEMATOLOGY: No anemia, easy bruising or bleeding SKIN: No rash or lesion. MUSCULOSKELETAL: No joint pain or arthritis.   NEUROLOGIC: No tingling, numbness, weakness.  PSYCHIATRY: No anxiety or depression.   DRUG ALLERGIES:  No Known Allergies  VITALS:  Blood pressure 138/75, pulse (!) 52, temperature 97.7 F (36.5 C), temperature source Oral, resp. rate 20, height 5\' 9"  (1.753 m), weight 73.9 kg, SpO2 93 %.  PHYSICAL EXAMINATION:  GENERAL:  82 y.o.-year-old patient lying in the bed with no acute distress.  EYES: Pupils equal, round, reactive to light and accommodation. No scleral icterus. Extraocular muscles intact.  HEENT: Head atraumatic, normocephalic. Oropharynx and nasopharynx clear.  NECK:  Supple, no jugular venous distention. No thyroid enlargement, no tenderness.  LUNGS: Normal breath sounds bilaterally, no wheezing, rales,rhonchi or crepitation. No use of accessory muscles of respiration.  CARDIOVASCULAR: Irregularly regular no murmurs  aBDOMEN: Soft, nontender, nondistended. Bowel sounds present.  EXTREMITIES: No pedal edema, cyanosis, or clubbing.  NEUROLOGIC: Cranial nerves II through XII are intact. Muscle strength 5/5 in all extremities. Sensation intact. Gait not  checked.  PSYCHIATRIC: The patient is alert and oriented x 3.  SKIN: No obvious rash, lesion, or ulcer.    LABORATORY PANEL:   CBC Recent Labs  Lab 02/02/18 0545  WBC 8.1  HGB 12.0*  HCT 37.7*  PLT 183   ------------------------------------------------------------------------------------------------------------------  Chemistries  Recent Labs  Lab 02/03/18 0357  NA 130*  K 3.9  CL 80*  CO2 42*  GLUCOSE 131*  BUN 26*  CREATININE 0.69  CALCIUM 9.1   ------------------------------------------------------------------------------------------------------------------  Cardiac Enzymes Recent Labs  Lab 02/02/18 0545  TROPONINI 0.03*   ------------------------------------------------------------------------------------------------------------------  RADIOLOGY:  No results found.  EKG:   Orders placed or performed during the hospital encounter of 02/01/18  . EKG 12-Lead  . EKG 12-Lead  . EKG 12-Lead  . EKG 12-Lead    ASSESSMENT AND PLAN:    *Acute on chronic hypoxic hypercarbic respiratory failure Most likely due to multifactorial process which includes acute on chronic congestive heart failure and COPD exacerbation Clinically better today  Currently on 4 L of oxygen supplemental oxygen with weaning as tolerated Note patient on 3 L via nasal cannula continuous at home  *Hyponatremia probably from Lasix Patient is mentating fine Lasix on hold Repeat BMP in a.m.  *Acute on chronic congestive heart failure exacerbation Congestive heart failure protocol IV Lasix on hold in view of hyponatremia, lisinopril supplemental oxygen wean as tolerated Acute MI ruled out with negative troponins Echocardiogram with 60 to 65% ejection fraction.  Cavity size was normal. [facility] cardiology is following  *Acute new onset A. Fib Discontinue heparin drip-as per cardiology recommendations, defer chronic anticoagulation at this time, recommending to discuss with PCP as an  outpatient Continue Coreg twice daily at a lower dose 3.125 mg twice daily  due to episode of bradycardia for rate control.  Pulmonary artery systolic pressure was within normal limits Okay to discharge patient from cardiology standpoint if she is not bradycardic   *Acute on COPD exacerbation Clinically better IV Solu-Medrol 60 mg IV every 6 hours tapered to 40 mg IV every 12 hours with tapering as tolerated  inhaled corticosteroids twice daily, aggressive pulmonary toilet bronchodilator therapy, mucolytic agents, respiratory therapy to see  check sputum cultures if patient can provide a sample  empiric azithromycin for 5-day course  *Acute on chronic severe malnutrition Dietary consulted for dietary supplements  *Chronic hypertension Currently hypotensive Lasix on hold, continue Coreg, vitals per routine with holding parameters    All the records are reviewed and case discussed with Care Management/Social Workerr. Management plans discussed with the patient, family and they are in agreement.  CODE STATUS: fc   TOTAL TIME TAKING CARE OF THIS PATIENT: 36 minutes.   POSSIBLE D/C IN 2 DAYS, DEPENDING ON CLINICAL CONDITION.  Note: This dictation was prepared with Dragon dictation along with smaller phrase technology. Any transcriptional errors that result from this process are unintentional.   Ramonita Lab M.D on 02/03/2018 at 1:48 PM  Between 7am to 6pm - Pager - [phone] After 6pm go to [email or web address] - password [email or web address] [facility]  [place] [address] [facility]  Office  [phone]  CC: Primary care physician; Lyndon Code, MD

## 2018-02-03 NOTE — Progress Notes (Signed)
[facility] Cardiology  SUBJECTIVE: The patient reports feeling better and states that his breathing is back to baseline with supplemental oxygen, which he also uses at home. He denies chest pain or palpitations.   Vitals:   02/02/18 0727 02/02/18 1458 02/02/18 2311 02/03/18 0450  BP: 134/71 (!) 98/50 (!) 123/59   Pulse: 77 78 (!) 58   Resp:   20   Temp: (!) 97.5 F (36.4 C) 97.6 F (36.4 C) 97.9 F (36.6 C)   TempSrc: Oral Oral Oral   SpO2: 97% 92% 92%   Weight:    73.9 kg  Height:         Intake/Output Summary (Last 24 hours) at 02/03/2018 4098 Last data filed at 02/02/2018 2042 Gross per 24 hour  Intake 237 ml  Output 100 ml  Net 137 ml      PHYSICAL EXAM  General: Well developed, well nourished, in no acute distress HEENT:  Normocephalic and atramatic. Poor dentition. Neck:  No JVD.  Lungs: Normal effort of breathing on supplemental oxygen via nasal cannula Heart: Irregularly irregular . Normal S1 and S2 without gallops or murmurs.  Abdomen: nondistended  Msk:  No obvious deformity.  Extremities: Trace bilateral lower extremity edema. Poor mobility and coordination of bilateral hands Neuro: Alert and oriented X 3. Psych:  Good affect, responds appropriately   LABS: Basic Metabolic Panel: Recent Labs    02/02/18 0545 02/03/18 0357  NA 134* 130*  K 4.1 3.9  CL 83* 80*  CO2 38* 42*  GLUCOSE 141* 131*  BUN 18 26*  CREATININE 0.55* 0.69  CALCIUM 8.6* 9.1   Liver Function Tests: No results for input(s): AST, ALT, ALKPHOS, BILITOT, PROT, ALBUMIN in the last 72 hours. No results for input(s): LIPASE, AMYLASE in the last 72 hours. CBC: Recent Labs    02/01/18 1242 02/02/18 0545  WBC 11.0* 8.1  NEUTROABS 8.4*  --   HGB 12.3* 12.0*  HCT 39.5 37.7*  MCV 95.4 92.2  PLT 201 183   Cardiac Enzymes: Recent Labs    02/01/18 1827 02/02/18 0004 02/02/18 0545  TROPONINI <0.03 0.03* 0.03*   BNP: Invalid input(s): POCBNP D-Dimer: No results for input(s):  DDIMER in the last 72 hours. Hemoglobin A1C: No results for input(s): HGBA1C in the last 72 hours. Fasting Lipid Panel: No results for input(s): CHOL, HDL, LDLCALC, TRIG, CHOLHDL, LDLDIRECT in the last 72 hours. Thyroid Function Tests: No results for input(s): TSH, T4TOTAL, T3FREE, THYROIDAB in the last 72 hours.  Invalid input(s): FREET3 Anemia Panel: No results for input(s): VITAMINB12, FOLATE, FERRITIN, TIBC, IRON, RETICCTPCT in the last 72 hours.  Dg Chest 2 View  Result Date: 02/01/2018 CLINICAL DATA:  Shortness of breath. EXAM: CHEST - 2 VIEW COMPARISON:  08/31/2016.  06/17/2016. FINDINGS: Mediastinum and hilar structures normal. Cardiomegaly with mild pulmonary venous congestion. Bibasilar atelectasis. Unchanged elevation right hemidiaphragm. Unchanged mild bowel distention. Prior cervicothoracic spine fusion. Hardware intact. IMPRESSION: 1.  Cardiomegaly with mild pulmonary venous congestion. 2. Persistent bibasilar atelectasis and elevation of the right hemidiaphragm. No interim change in appearance from prior exams. Electronically Signed   By: Maisie Fus  Register   On: 02/01/2018 13:43     Echo EF 60-65%   ASSESSMENT AND PLAN:  Active Problems:   Respiratory failure (HCC)   Pressure injury of skin    1. Acute on chronic hypoxic respiratory failure likely secondary to acute on chronic diastolic CHF and COPD exacerbation. On 3L of oxygen via [place] which is what he wears at  home. Patient clinically improving and feels he is back to baseline. EF normal. 2. Atrial fibrillation, rate controlled, though reportedly had episodes of bradycardia per nurse yesterday. On carvedilol 6.25 mg BID. Noted to be in atrial fibrillation per ECG in 07/2016.  3. Hypertension, diastolic low this morning  Recommendations: 1. Defer chronic anticoagulation at this time; recommend discussing as outpatient with PCP 2. The patient may follow-up with [facility] cardiology as outpatient or follow-up with Dr. Milta Deiters  as outpatient as his primary care provider is Beverely Risen. 3. Decrease carvedilol to 3.125 mg BID due to episodic bradycardia. 4. Recommend holding Lasix due to hyponatremia today. 5. If patient's heart rate remains stable, may be discharged today from cardiovascular perspective.   Leanora Ivanoff, PA-C 02/03/2018 8:52 AM    Sign off for now; call with any questions.

## 2018-02-03 NOTE — Plan of Care (Signed)
  Problem: Education: Goal: Knowledge of General Education information will improve Description: Including pain rating scale, medication(s)/side effects and non-pharmacologic comfort measures Outcome: Progressing   Problem: Health Behavior/Discharge Planning: Goal: Ability to manage health-related needs will improve Outcome: Progressing   Problem: Clinical Measurements: Goal: Ability to maintain clinical measurements within normal limits will improve Outcome: Progressing Goal: Will remain free from infection Outcome: Progressing Goal: Diagnostic test results will improve Outcome: Progressing Goal: Respiratory complications will improve Outcome: Progressing Goal: Cardiovascular complication will be avoided Outcome: Progressing   Problem: Activity: Goal: Risk for activity intolerance will decrease Outcome: Progressing   Problem: Nutrition: Goal: Adequate nutrition will be maintained Outcome: Progressing   Problem: Coping: Goal: Level of anxiety will decrease Outcome: Progressing   Problem: Elimination: Goal: Will not experience complications related to urinary retention Outcome: Progressing   Problem: Safety: Goal: Ability to remain free from injury will improve Outcome: Progressing   Problem: Skin Integrity: Goal: Risk for impaired skin integrity will decrease Outcome: Progressing   

## 2018-02-04 LAB — BASIC METABOLIC PANEL
Anion gap: 11 (ref 5–15)
BUN: 33 mg/dL — ABNORMAL HIGH (ref 8–23)
CALCIUM: 9 mg/dL (ref 8.9–10.3)
CO2: 41 mmol/L — ABNORMAL HIGH (ref 22–32)
Chloride: 83 mmol/L — ABNORMAL LOW (ref 98–111)
Creatinine, Ser: 0.54 mg/dL — ABNORMAL LOW (ref 0.61–1.24)
GFR calc Af Amer: >60 mL/min (ref >60)
GLUCOSE: 119 mg/dL — AB (ref 70–99)
POTASSIUM: 4.6 mmol/L (ref 3.5–5.1)
SODIUM: 135 mmol/L (ref 135–145)

## 2018-02-04 MED ORDER — ENSURE ENLIVE PO LIQD: 237.0000 mL | Freq: Three times a day (TID) | ORAL | 3 refills | Status: DC

## 2018-02-04 MED ORDER — FUROSEMIDE 20 MG PO TABS: 20.0000 mg | ORAL_TABLET | Freq: Every day | ORAL | 0 refills | Status: DC

## 2018-02-04 MED ORDER — AZITHROMYCIN 250 MG PO TABS: ORAL_TABLET | ORAL | 0 refills | Status: DC

## 2018-02-04 MED ORDER — PREDNISONE 10 MG (21) PO TBPK: 10.0000 mg | ORAL_TABLET | Freq: Every day | ORAL | 0 refills | Status: DC

## 2018-02-04 MED ORDER — CARVEDILOL 3.125 MG PO TABS: 3.1250 mg | ORAL_TABLET | Freq: Two times a day (BID) | ORAL | 0 refills | Status: DC

## 2018-02-04 MED ORDER — LISINOPRIL 2.5 MG PO TABS: 2.5000 mg | ORAL_TABLET | Freq: Every day | ORAL | 0 refills | Status: DC

## 2018-02-04 MED ORDER — GUAIFENESIN-DM 100-10 MG/5ML PO SYRP: 5.0000 mL | ORAL_SOLUTION | ORAL | 0 refills | Status: DC | PRN

## 2018-02-04 MED ORDER — ACETAMINOPHEN 325 MG PO TABS: 650.0000 mg | ORAL_TABLET | Freq: Four times a day (QID) | ORAL | Status: AC | PRN

## 2018-02-04 NOTE — Discharge Instructions (Signed)
Follow-up with primary care physician in 3 days Follow-up with cardiology Dr. Darrold Junker in a week Home health PT RN Continue oxygen via nasal cannula Daily weight monitoring, intake and output Outpatient follow-up with CHF clinic in 3 days

## 2018-02-04 NOTE — Progress Notes (Signed)
Pt ready for discharge home today per MD. Patient assessment unchanged from this morning- now on 3Loxygen. Swot RN teviewed discharge instructions and prescriptions with pt. PIV removed, VSS. Pt assisted to car via NT.  Scott Richard

## 2018-02-04 NOTE — Care Management Note (Signed)
Case Management Note  Patient Details  Name: Scott Richard MRN: [id number] Date of Birth: April 12, 1936  Subjective/Objective:    Discharging today. Admitted with Hypoxia, dyspnea, CHF, COPD exacerbation and new onset afib. He lives at home with his sister. Has Touched by an [name] 6 hours per day 5 days per week.  On chronic 3 L O2 through [employer] with a nebulizer in the home. Spoke with patient and he ask that I call his sister regarding home health. Spoke with his sister, [name]. She is agreeable to home health with no agency preference. I explained I wanted to set him up with [facility] due to the CHF protocol and ReDs Vest program. Faxed orders to [place] at [phone]. Family will transport patient home.                Action/Plan:   Expected Discharge Date:  02/04/18               Expected Discharge Plan:  Home w Home Health Services  In-House Referral:     Discharge planning Services  CM Consult  Post Acute Care Choice:  Home Health Choice offered to:  Patient, Sibling  DME Arranged:  (S) (ReDS Vest) DME Agency:  [facility]  HH Arranged:  Charity fundraiser, PT HH Agency:  [facility]  Status of Service:  Completed, signed off  If discussed at [employer], dates discussed:    Additional Comments:  Marily Memos, RN 02/04/2018, 2:17 PM

## 2018-02-04 NOTE — Care Management Important Message (Signed)
Important Message  Patient Details  Name: Scott Richard MRN: [id number] Date of Birth: 08/25/1935   Medicare Important Message Given:  Yes    Olegario Messier [name] 02/04/2018, 10:45 AM

## 2018-02-04 NOTE — Discharge Summary (Signed)
[facility] - [place] at [facility]   PATIENT NAME: Scott Richard    MR#:  [id number]  DATE OF BIRTH:  Jan 17, 1936  DATE OF ADMISSION:  02/01/2018 ADMITTING PHYSICIAN: Evelena Asa Salary, MD  DATE OF DISCHARGE: 02/04/18   PRIMARY CARE PHYSICIAN: Lyndon Code, MD    ADMISSION DIAGNOSIS:  Hypoxia [R09.02] Chronic obstructive pulmonary disease with acute exacerbation (HCC) [J44.1] Atrial fibrillation, unspecified type (HCC) [I48.91] Dyspnea, unspecified type [R06.00] Acute congestive heart failure, unspecified heart failure type (HCC) [I50.9]  DISCHARGE DIAGNOSIS:  Active Problems:   Respiratory failure (HCC)   Pressure injury of skin   Protein-calorie malnutrition, severe   SECONDARY DIAGNOSIS:   Past Medical History:  Diagnosis Date  . Arthritis   . CHF (congestive heart failure) (HCC)   . COPD (chronic obstructive pulmonary disease) (HCC)   . Hypertension     HOSPITAL COURSE:   *Acute on chronic hypoxic hypercarbic respiratory failure Most likely due to multifactorial process which includes acute on chronic congestive heart failure and COPD exacerbation Clinically better today  Currently on 3 L of oxygen, note patient on 3 L via nasal cannula continuous at home  *Hyponatremia -probably from Lasix IV Patient is mentating fine Sodium is back to normal, resume Lasix and monitor sodium closely  *Acute on chronic congestive heart failure exacerbation Congestive heart failure protocol Resume Lasix, continue lisinopril Acute MI ruled out with negative troponins Echocardiogram with 60 to 65% ejection fraction.  Cavity size was normal. [facility] cardiology is following.  Follow-up with Dr. Darrold Junker in a week Outpatient CHF clinic   *Acute new onset A. Fib Rate controlled, clinically improved Discontinue heparin drip-as per cardiology recommendations, defer chronic anticoagulation at this time, recommending to discuss with PCP as an  outpatient Continue Coreg twice daily at a lower dose 3.125 mg twice daily due to episode of bradycardia for rate control.  Pulmonary artery systolic pressure was within normal limits Okay to discharge patient from cardiology standpoint if she is not bradycardic   *Acute on COPD exacerbation Clinically better IV Solu-Medrol 60 mg IV every 6 hours tapered to 40 mg IV every 12 hours with tapering as tolerated  inhaled corticosteroids twice daily, aggressive pulmonary toilet bronchodilator therapy, mucolytic agents, respiratory therapy to see  check sputum cultures if patient can provide a sample  empiric azithromycin for 5-day course  *Acute on chronic severe malnutrition Dietary consulted for dietary supplements  *Chronic hypertension Currently hypotensive continue Coreg, vitals per routine with holding parameters  Disposition physical therapy recommended SNF patient is refusing he wants to go home with home health PT.  Discussed with patient's sister [name]  she is agreeable with HH  DISCHARGE CONDITIONS:   stable  CONSULTS OBTAINED:  Treatment Team:  Marcina Millard, MD   PROCEDURES NONE   DRUG ALLERGIES:  No Known Allergies  DISCHARGE MEDICATIONS:   Allergies as of 02/04/2018   No Known Allergies     Medication List    TAKE these medications   acetaminophen 325 MG tablet Commonly known as:  TYLENOL Take 2 tablets (650 mg total) by mouth every 6 (six) hours as needed for mild pain or headache.   albuterol 108 (90 Base) MCG/ACT inhaler Commonly known as:  PROVENTIL HFA;VENTOLIN HFA Inhale 1-2 puffs into the lungs every 6 (six) hours as needed for wheezing or shortness of breath.   azithromycin 250 MG tablet Commonly known as:  ZITHROMAX Take 1 tablet p.o. once daily Start taking on:  02/05/2018  carvedilol 3.125 MG tablet Commonly known as:  COREG Take 1 tablet (3.125 mg total) by mouth 2 (two) times daily with a meal.   cetirizine 10 MG  tablet Commonly known as:  ZYRTEC Take 10 mg by mouth daily.   feeding supplement (ENSURE ENLIVE) Liqd Take 237 mLs by mouth 3 (three) times daily between meals.   fluticasone 50 MCG/ACT nasal spray Commonly known as:  FLONASE Place 2 sprays into both nostrils daily. What changed:  when to take this   furosemide 20 MG tablet Commonly known as:  LASIX Take 1 tablet (20 mg total) by mouth daily.   guaiFENesin 600 MG 12 hr tablet Commonly known as:  MUCINEX Take 600 mg by mouth 2 (two) times daily.   guaiFENesin-dextromethorphan 100-10 MG/5ML syrup Commonly known as:  ROBITUSSIN DM Take 5 mLs by mouth every 4 (four) hours as needed for cough.   ipratropium-albuterol 0.5-2.5 (3) MG/3ML Soln Commonly known as:  DUONEB Take 3 mLs by nebulization every 6 (six) hours as needed (wheezing).   lisinopril 2.5 MG tablet Commonly known as:  PRINIVIL,ZESTRIL Take 1 tablet (2.5 mg total) by mouth daily. Start taking on:  02/05/2018   meloxicam 7.5 MG tablet Commonly known as:  MOBIC Take 1 tablet (7.5 mg total) by mouth 2 (two) times daily as needed.   multivitamin capsule Take by mouth.   oxyCODONE 5 MG immediate release tablet Commonly known as:  Oxy IR/ROXICODONE Take 1 tablet (5 mg total) by mouth every 6 (six) hours as needed for moderate pain.   OXYGEN Inhale 2 L into the lungs continuous.   pramipexole 0.5 MG tablet Commonly known as:  MIRAPEX Take 1 tablet (0.5 mg total) by mouth 3 (three) times daily.   predniSONE 10 MG (21) Tbpk tablet Commonly known as:  STERAPRED UNI-PAK 21 TAB Take 1 tablet (10 mg total) by mouth daily. Take 6 tablets by mouth for 1 day followed by  5 tablets by mouth for 1 day followed by  4 tablets by mouth for 1 day followed by  3 tablets by mouth for 1 day followed by  2 tablets by mouth for 1 day followed by  1 tablet by mouth for a day and stop   SYMBICORT 160-4.5 MCG/ACT inhaler Generic drug:  budesonide-formoterol Inhale 2 puffs into  the lungs 2 (two) times daily.   Vitamin C 500 MG Caps Take by mouth daily.   VITAMIN D-1000 MAX ST 1000 units tablet Generic drug:  Cholecalciferol Take 1 tablet by mouth daily.        DISCHARGE INSTRUCTIONS:   Follow-up with primary care physician in 3 days Follow-up with cardiology Dr. Darrold Junker in a week Home health PT RN Continue oxygen via nasal cannula Daily weight monitoring, intake and output Outpatient follow-up with CHF clinic in 3 days  DIET:  Cardiac diet  DISCHARGE CONDITION:  Fair  ACTIVITY:  Activity as tolerated  OXYGEN:  Home Oxygen: Yes.     Oxygen Delivery: 3 liters/min via Patient connected to nasal cannula oxygen  DISCHARGE LOCATION:  home   If you experience worsening of your admission symptoms, develop shortness of breath, life threatening emergency, suicidal or homicidal thoughts you must seek medical attention immediately by calling 911 or calling your MD immediately  if symptoms less severe.  You Must read complete instructions/literature along with all the possible adverse reactions/side effects for all the Medicines you take and that have been prescribed to you. Take any new Medicines after you have  completely understood and accpet all the possible adverse reactions/side effects.   Please note  You were cared for by a hospitalist during your hospital stay. If you have any questions about your discharge medications or the care you received while you were in the hospital after you are discharged, you can call the unit and asked to speak with the hospitalist on call if the hospitalist that took care of you is not available. Once you are discharged, your primary care physician will handle any further medical issues. Please note that NO REFILLS for any discharge medications will be authorized once you are discharged, as it is imperative that you return to your primary care physician (or establish a relationship with a primary care physician if you  do not have one) for your aftercare needs so that they can reassess your need for medications and monitor your lab values.     Today  Chief Complaint  Patient presents with  . Shortness of Breath    Patient is doing fine.  Out of bed to chair.  Chronically lives on 3 L of oxygen via nasal cannula.  Feeling much better denies any shortness of breath wants to go home.  PT has recommended skilled nursing facility but patient prefers going home with home health will arrange the same.  Okay to discharge patient from cardiology standpoint ROS:  CONSTITUTIONAL: Denies fevers, chills. Denies any fatigue, weakness.  EYES: Denies blurry vision, double vision, eye pain. EARS, NOSE, THROAT: Denies tinnitus, ear pain, hearing loss. RESPIRATORY: Denies cough, wheeze, shortness of breath.  CARDIOVASCULAR: Denies chest pain, palpitations, edema.  GASTROINTESTINAL: Denies nausea, vomiting, diarrhea, abdominal pain. Denies bright red blood per rectum. GENITOURINARY: Denies dysuria, hematuria. ENDOCRINE: Denies nocturia or thyroid problems. HEMATOLOGIC AND LYMPHATIC: Denies easy bruising or bleeding. SKIN: Denies rash or lesion. MUSCULOSKELETAL: Denies pain in neck, back, shoulder, knees, hips or arthritic symptoms.  NEUROLOGIC: Denies paralysis, paresthesias.  PSYCHIATRIC: Denies anxiety or depressive symptoms.   VITAL SIGNS:  Blood pressure (!) 129/58, pulse 66, temperature 97.7 F (36.5 C), temperature source Oral, resp. rate 19, height 5\' 9"  (1.753 m), weight 73.9 kg, SpO2 99 %.  I/O:    Intake/Output Summary (Last 24 hours) at 02/04/2018 1417 Last data filed at 02/04/2018 1412 Gross per 24 hour  Intake 1200 ml  Output 100 ml  Net 1100 ml    PHYSICAL EXAMINATION:  GENERAL:  82 y.o.-year-old patient lying in the bed with no acute distress.  EYES: Pupils equal, round, reactive to light and accommodation. No scleral icterus. Extraocular muscles intact.  HEENT: Head atraumatic,  normocephalic. Oropharynx and nasopharynx clear.  NECK:  Supple, no jugular venous distention. No thyroid enlargement, no tenderness.  LUNGS: Normal breath sounds bilaterally, no wheezing, rales,rhonchi or crepitation. No use of accessory muscles of respiration.  CARDIOVASCULAR: S1, S2 normal. No murmurs, rubs, or gallops.  ABDOMEN: Soft, non-tender, non-distended. Bowel sounds present. No organomegaly or mass.  EXTREMITIES: No pedal edema, cyanosis, or clubbing.  NEUROLOGIC: Cranial nerves II through XII are intact.  Sensation intact. Gait not checked.  PSYCHIATRIC: The patient is alert and oriented x 3.  SKIN: No obvious rash, lesion, or ulcer.   DATA REVIEW:   CBC Recent Labs  Lab 02/02/18 0545  WBC 8.1  HGB 12.0*  HCT 37.7*  PLT 183    Chemistries  Recent Labs  Lab 02/04/18 0408  NA 135  K 4.6  CL 83*  CO2 41*  GLUCOSE 119*  BUN 33*  CREATININE 0.54*  CALCIUM 9.0    Cardiac Enzymes Recent Labs  Lab 02/02/18 0545  TROPONINI 0.03*    Microbiology Results  Results for orders placed or performed during the hospital encounter of 06/17/16  MRSA PCR Screening     Status: None   Collection Time: 06/18/16 12:54 AM  Result Value Ref Range Status   MRSA by PCR NEGATIVE NEGATIVE Final    Comment:        The GeneXpert MRSA Assay (FDA approved for NASAL specimens only), is one component of a comprehensive MRSA colonization surveillance program. It is not intended to diagnose MRSA infection nor to guide or monitor treatment for MRSA infections.     RADIOLOGY:  Dg Chest 2 View  Result Date: 02/01/2018 CLINICAL DATA:  Shortness of breath. EXAM: CHEST - 2 VIEW COMPARISON:  08/31/2016.  06/17/2016. FINDINGS: Mediastinum and hilar structures normal. Cardiomegaly with mild pulmonary venous congestion. Bibasilar atelectasis. Unchanged elevation right hemidiaphragm. Unchanged mild bowel distention. Prior cervicothoracic spine fusion. Hardware intact. IMPRESSION: 1.   Cardiomegaly with mild pulmonary venous congestion. 2. Persistent bibasilar atelectasis and elevation of the right hemidiaphragm. No interim change in appearance from prior exams. Electronically Signed   By: Maisie Fus  Register   On: 02/01/2018 13:43    EKG:   Orders placed or performed during the hospital encounter of 02/01/18  . EKG 12-Lead  . EKG 12-Lead  . EKG 12-Lead  . EKG 12-Lead      Management plans discussed with the patient, family and they are in agreement.  CODE STATUS:     Code Status Orders  (From admission, onward)         Start     Ordered   02/01/18 1735  Full code  Continuous     02/01/18 1735        Code Status History    Date Active Date Inactive Code Status Order ID Comments User Context   06/17/2016 2320 06/22/2016 1628 Full Code [id number]  Gwendolyn Fill, NP ED      TOTAL TIME TAKING CARE OF THIS PATIENT: 43  minutes.   Note: This dictation was prepared with Dragon dictation along with smaller phrase technology. Any transcriptional errors that result from this process are unintentional.   @M[facility] @  on 02/04/2018 at 2:17 PM  Between 7am to 6pm - Pager - [phone]  After 6pm go to [email or web address] - password [email or web address] [facility]  [place] [facility]  Office  [phone]  CC: Primary care physician; Lyndon Code, MD

## 2018-02-05 DIAGNOSIS — J449 Chronic obstructive pulmonary disease, unspecified: Secondary | ICD-10-CM | POA: Diagnosis not present

## 2018-02-07 ENCOUNTER — Encounter: Payer: Self-pay | Admitting: Adult Health

## 2018-02-07 ENCOUNTER — Ambulatory Visit (INDEPENDENT_AMBULATORY_CARE_PROVIDER_SITE_OTHER): Payer: Medicare HMO | Admitting: Adult Health

## 2018-02-07 VITALS — BP 128/78 | HR 57 | Resp 16 | Ht 71.0 in | Wt 165.0 lb

## 2018-02-07 DIAGNOSIS — I4891 Unspecified atrial fibrillation: Secondary | ICD-10-CM

## 2018-02-07 DIAGNOSIS — I10 Essential (primary) hypertension: Secondary | ICD-10-CM

## 2018-02-07 DIAGNOSIS — I50813 Acute on chronic right heart failure: Secondary | ICD-10-CM | POA: Diagnosis not present

## 2018-02-07 DIAGNOSIS — Z9981 Dependence on supplemental oxygen: Secondary | ICD-10-CM

## 2018-02-07 DIAGNOSIS — J9611 Chronic respiratory failure with hypoxia: Secondary | ICD-10-CM

## 2018-02-07 DIAGNOSIS — J9612 Chronic respiratory failure with hypercapnia: Secondary | ICD-10-CM

## 2018-02-07 DIAGNOSIS — R531 Weakness: Secondary | ICD-10-CM

## 2018-02-07 DIAGNOSIS — J449 Chronic obstructive pulmonary disease, unspecified: Secondary | ICD-10-CM | POA: Diagnosis not present

## 2018-02-07 DIAGNOSIS — J441 Chronic obstructive pulmonary disease with (acute) exacerbation: Secondary | ICD-10-CM | POA: Diagnosis not present

## 2018-02-07 NOTE — Progress Notes (Signed)
[facility] [address] [place], [place] [address]  Internal MEDICINE  Office Visit Note  Patient Name: Scott Richard  [id number]  [id number]  Date of Service: 02/07/2018     Chief Complaint  Patient presents with  . Hospitalization Follow-up     HPI Pt is here for recent hospital follow up. He reports he was short of breath for about a week and had increased congestion. His sister called 911 and he went to [facility] ED on Oct 22.  He was found to be  70% on room air.  He is chronically on oxygen at 4 lpm currently. He was in hospital for 3 days.  His official diagnosis was acute on chronic respiratory failure with hypoxia, CHF, and New onset A-fib. He was placed on azithromycin in hospital and completed the course with no difficulty.  He was also given lasix, and MI was ruled out.  His ejection fraction was found to be 60-65% in hospital.  He will follow up with Dr. Darrold Junker in a week.    Current Medication: Outpatient Encounter Medications as of 02/07/2018  Medication Sig  . acetaminophen (TYLENOL) 325 MG tablet Take 2 tablets (650 mg total) by mouth every 6 (six) hours as needed for mild pain or headache.  . albuterol (PROVENTIL HFA;VENTOLIN HFA) 108 (90 Base) MCG/ACT inhaler Inhale 1-2 puffs into the lungs every 6 (six) hours as needed for wheezing or shortness of breath.  . Ascorbic Acid (VITAMIN C) 500 MG CAPS Take by mouth daily.  Marland Kitchen azithromycin (ZITHROMAX) 250 MG tablet Take 1 tablet p.o. once daily  . carvedilol (COREG) 3.125 MG tablet Take 1 tablet (3.125 mg total) by mouth 2 (two) times daily with a meal.  . cetirizine (ZYRTEC) 10 MG tablet Take 10 mg by mouth daily.  . Cholecalciferol (VITAMIN D-1000 MAX ST) 1000 units tablet Take 1 tablet by mouth daily.  . feeding supplement, ENSURE ENLIVE, (ENSURE ENLIVE) LIQD Take 237 mLs by mouth 3 (three) times daily between meals.  . fluticasone (FLONASE) 50 MCG/ACT nasal spray Place 2 sprays into both nostrils daily.  (Patient taking differently: Place 2 sprays into both nostrils 2 (two) times daily. )  . furosemide (LASIX) 20 MG tablet Take 1 tablet (20 mg total) by mouth daily.  Marland Kitchen guaiFENesin (MUCINEX) 600 MG 12 hr tablet Take 600 mg by mouth 2 (two) times daily.  Marland Kitchen guaiFENesin-dextromethorphan (ROBITUSSIN DM) 100-10 MG/5ML syrup Take 5 mLs by mouth every 4 (four) hours as needed for cough.  Marland Kitchen ipratropium-albuterol (DUONEB) 0.5-2.5 (3) MG/3ML SOLN Take 3 mLs by nebulization every 6 (six) hours as needed (wheezing).  Marland Kitchen lisinopril (PRINIVIL,ZESTRIL) 2.5 MG tablet Take 1 tablet (2.5 mg total) by mouth daily.  . meloxicam (MOBIC) 7.5 MG tablet Take 1 tablet (7.5 mg total) by mouth 2 (two) times daily as needed.  . Multiple Vitamin (MULTIVITAMIN) capsule Take by mouth.  . oxyCODONE (OXY IR/ROXICODONE) 5 MG immediate release tablet Take 1 tablet (5 mg total) by mouth every 6 (six) hours as needed for moderate pain.  . OXYGEN Inhale 2 L into the lungs continuous.  . pramipexole (MIRAPEX) 0.5 MG tablet Take 1 tablet (0.5 mg total) by mouth 3 (three) times daily.  . predniSONE (STERAPRED UNI-PAK 21 TAB) 10 MG (21) TBPK tablet Take 1 tablet (10 mg total) by mouth daily. Take 6 tablets by mouth for 1 day followed by  5 tablets by mouth for 1 day followed by  4 tablets by mouth for 1  day followed by  3 tablets by mouth for 1 day followed by  2 tablets by mouth for 1 day followed by  1 tablet by mouth for a day and stop  . SYMBICORT 160-4.5 MCG/ACT inhaler Inhale 2 puffs into the lungs 2 (two) times daily.   No facility-administered encounter medications on file as of 02/07/2018.     Surgical History: Past Surgical History:  Procedure Laterality Date  . NECK SURGERY    . TONSILLECTOMY      Medical History: Past Medical History:  Diagnosis Date  . Arthritis   . CHF (congestive heart failure) (HCC)   . COPD (chronic obstructive pulmonary disease) (HCC)   . Hypertension     Family History: Family History   Problem Relation Age of Onset  . Prostate cancer Neg Hx   . Bladder Cancer Neg Hx   . Kidney cancer Neg Hx     Social History   Socioeconomic History  . Marital status: Divorced    Spouse name: Not on file  . Number of children: Not on file  . Years of education: Not on file  . Highest education level: Not on file  Occupational History  . Not on file  Social Needs  . Financial resource strain: Not on file  . Food insecurity:    Worry: Not on file    Inability: Not on file  . Transportation needs:    Medical: Not on file    Non-medical: Not on file  Tobacco Use  . Smoking status: Former Smoker    Last attempt to quit: 2012    Years since quitting: 7.8  . Smokeless tobacco: Never Used  Substance and Sexual Activity  . Alcohol use: No  . Drug use: No  . Sexual activity: Not on file  Lifestyle  . Physical activity:    Days per week: Not on file    Minutes per session: Not on file  . Stress: Not on file  Relationships  . Social connections:    Talks on phone: Not on file    Gets together: Not on file    Attends religious service: Not on file    Active member of club or organization: Not on file    Attends meetings of clubs or organizations: Not on file    Relationship status: Not on file  . Intimate partner violence:    Fear of current or ex partner: Not on file    Emotionally abused: Not on file    Physically abused: Not on file    Forced sexual activity: Not on file  Other Topics Concern  . Not on file  Social History Narrative  . Not on file      Review of Systems  Constitutional: Negative.  Negative for chills, fatigue and unexpected weight change.  HENT: Negative.  Negative for congestion, rhinorrhea, sneezing and sore throat.   Eyes: Negative for redness.  Respiratory: Negative.  Negative for cough, chest tightness and shortness of breath.   Cardiovascular: Negative.  Negative for chest pain and palpitations.  Gastrointestinal: Negative.  Negative  for abdominal pain, constipation, diarrhea, nausea and vomiting.  Endocrine: Negative.   Genitourinary: Negative.  Negative for dysuria and frequency.  Musculoskeletal: Negative.  Negative for arthralgias, back pain, joint swelling and neck pain.  Skin: Negative.  Negative for rash.  Allergic/Immunologic: Negative.   Neurological: Negative.  Negative for tremors and numbness.  Hematological: Negative for adenopathy. Does not bruise/bleed easily.  Psychiatric/Behavioral: Negative.  Negative for  behavioral problems, sleep disturbance and suicidal ideas. The patient is not nervous/anxious.     Vital Signs: BP 128/78   Pulse (!) 57   Resp 16   Ht 5\' 11"  (1.803 m)   Wt 165 lb (74.8 kg)   SpO2 90%   BMI 23.01 kg/m    Physical Exam  Constitutional: He is oriented to person, place, and time. He appears well-developed and well-nourished. No distress.  HENT:  Head: Normocephalic and atraumatic.  Mouth/Throat: Oropharynx is clear and moist. No oropharyngeal exudate.  Eyes: Pupils are equal, round, and reactive to light. EOM are normal.  Neck: Normal range of motion. Neck supple. No JVD present. No tracheal deviation present. No thyromegaly present.  Cardiovascular: Normal rate, regular rhythm and normal heart sounds. Exam reveals no gallop and no friction rub.  No murmur heard. Pulmonary/Chest: Effort normal and breath sounds normal. No respiratory distress. He has no wheezes. He has no rales. He exhibits no tenderness.  Abdominal: Soft. There is no tenderness. There is no guarding.  Musculoskeletal: Normal range of motion.  Lymphadenopathy:    He has no cervical adenopathy.  Neurological: He is alert and oriented to person, place, and time. No cranial nerve deficit.  Skin: Skin is warm and dry. He is not diaphoretic.  Redness noted to left heel, and sole of foot.   Psychiatric: He has a normal mood and affect. His behavior is normal. Judgment and thought content normal.  Nursing note  and vitals reviewed.  Assessment/Plan: 1. Acute on chronic right-sided congestive heart failure (HCC) Continue supplement oxygen as well as lasix.    2. Chronic respiratory failure with hypoxia and hypercapnia (HCC) Pt denies increased symptoms.  States he has improved since being discharged. Continue to monitor symptoms.  Continue to use supplement O2 as directed.   3. Dependence on supplemental oxygen Pt remains dependant on oxygen a 3-4 lpm via [place].  Continue to use day and night.   4. Chronic obstructive pulmonary disease, unspecified COPD type (HCC) Treated in hospital, pt denies continued symptoms or breathing issues.  Continue to use oxygen as prescribed.   5. Essential hypertension BP stable, continue current medications.  Bradycardia noted, most likely from carvedilol.  Pt's dose was halved, will start treatment at heart failure clinic on 02/11/18.   6. Weakness Pt is weak and using wheelchair more than walker now.  He refused to go to rehab after hospital.  Home health is suppose to start coming out in the next week.   7. Atrial fibrillation, unspecified type (HCC) Pt not on chronic anti-coagulation.  Also, HR is regular, and brady at this visit.  Reduced patients dose of coreg to once daily instead of Bid.  Told pt to follow up with Cardiology asap, and allow them to adjust dose as needed beyond that.   General Counseling: Amaris verbalizes understanding of the findings of todays visit and agrees with plan of treatment. I have discussed any further diagnostic evaluation that may be needed or ordered today. We also reviewed his medications today. he has been encouraged to call the office with any questions or concerns that should arise related to todays visit.   No orders of the defined types were placed in this encounter.   I have reviewed all medical records from hospital follow up including radiology reports and consults from other physicians. Appropriate follow up  diagnostics will be scheduled as needed. Patient/ Family understands the plan of treatment.  Time spent 30 minutes.  Coralee North.  Cardale Dorer AGNP-C Internal Medicine

## 2018-02-07 NOTE — Patient Instructions (Signed)

## 2018-02-08 DIAGNOSIS — Z9981 Dependence on supplemental oxygen: Secondary | ICD-10-CM | POA: Diagnosis not present

## 2018-02-08 DIAGNOSIS — I50813 Acute on chronic right heart failure: Secondary | ICD-10-CM | POA: Diagnosis not present

## 2018-02-08 DIAGNOSIS — I4891 Unspecified atrial fibrillation: Secondary | ICD-10-CM | POA: Diagnosis not present

## 2018-02-08 DIAGNOSIS — I110 Hypertensive heart disease with heart failure: Secondary | ICD-10-CM | POA: Diagnosis not present

## 2018-02-08 DIAGNOSIS — J441 Chronic obstructive pulmonary disease with (acute) exacerbation: Secondary | ICD-10-CM | POA: Diagnosis not present

## 2018-02-08 DIAGNOSIS — J9611 Chronic respiratory failure with hypoxia: Secondary | ICD-10-CM | POA: Diagnosis not present

## 2018-02-08 DIAGNOSIS — J9612 Chronic respiratory failure with hypercapnia: Secondary | ICD-10-CM | POA: Diagnosis not present

## 2018-02-08 DIAGNOSIS — E43 Unspecified severe protein-calorie malnutrition: Secondary | ICD-10-CM | POA: Diagnosis not present

## 2018-02-10 DIAGNOSIS — Z79899 Other long term (current) drug therapy: Secondary | ICD-10-CM | POA: Diagnosis not present

## 2018-02-10 DIAGNOSIS — I480 Paroxysmal atrial fibrillation: Secondary | ICD-10-CM | POA: Diagnosis not present

## 2018-02-11 ENCOUNTER — Encounter: Payer: Self-pay | Admitting: Family

## 2018-02-11 ENCOUNTER — Ambulatory Visit: Payer: Medicare HMO | Attending: Family | Admitting: Family

## 2018-02-11 VITALS — BP 118/67 | HR 69 | Resp 18 | Ht 71.0 in | Wt 169.0 lb

## 2018-02-11 DIAGNOSIS — I4819 Other persistent atrial fibrillation: Secondary | ICD-10-CM

## 2018-02-11 DIAGNOSIS — I5032 Chronic diastolic (congestive) heart failure: Secondary | ICD-10-CM | POA: Diagnosis not present

## 2018-02-11 DIAGNOSIS — I110 Hypertensive heart disease with heart failure: Secondary | ICD-10-CM | POA: Insufficient documentation

## 2018-02-11 DIAGNOSIS — Z87891 Personal history of nicotine dependence: Secondary | ICD-10-CM | POA: Diagnosis not present

## 2018-02-11 DIAGNOSIS — Z79891 Long term (current) use of opiate analgesic: Secondary | ICD-10-CM | POA: Insufficient documentation

## 2018-02-11 DIAGNOSIS — R32 Unspecified urinary incontinence: Secondary | ICD-10-CM | POA: Diagnosis not present

## 2018-02-11 DIAGNOSIS — I890 Lymphedema, not elsewhere classified: Secondary | ICD-10-CM | POA: Insufficient documentation

## 2018-02-11 DIAGNOSIS — J449 Chronic obstructive pulmonary disease, unspecified: Secondary | ICD-10-CM | POA: Insufficient documentation

## 2018-02-11 DIAGNOSIS — I4891 Unspecified atrial fibrillation: Secondary | ICD-10-CM | POA: Diagnosis not present

## 2018-02-11 DIAGNOSIS — Z7901 Long term (current) use of anticoagulants: Secondary | ICD-10-CM | POA: Diagnosis not present

## 2018-02-11 DIAGNOSIS — Z7951 Long term (current) use of inhaled steroids: Secondary | ICD-10-CM | POA: Insufficient documentation

## 2018-02-11 DIAGNOSIS — E785 Hyperlipidemia, unspecified: Secondary | ICD-10-CM | POA: Diagnosis not present

## 2018-02-11 DIAGNOSIS — I2510 Atherosclerotic heart disease of native coronary artery without angina pectoris: Secondary | ICD-10-CM | POA: Insufficient documentation

## 2018-02-11 DIAGNOSIS — J439 Emphysema, unspecified: Secondary | ICD-10-CM

## 2018-02-11 DIAGNOSIS — Z8673 Personal history of transient ischemic attack (TIA), and cerebral infarction without residual deficits: Secondary | ICD-10-CM | POA: Insufficient documentation

## 2018-02-11 DIAGNOSIS — I10 Essential (primary) hypertension: Secondary | ICD-10-CM

## 2018-02-11 DIAGNOSIS — Z79899 Other long term (current) drug therapy: Secondary | ICD-10-CM | POA: Insufficient documentation

## 2018-02-11 NOTE — Progress Notes (Signed)
Patient ID: Scott Richard, male    DOB: Sep 08, 1935, 82 y.o.   MRN: [id number]  HPI  [name] is a 82 y/o male with a history of CAD, hyperlipidemia, HTN, stroke, COPD, arthritis, atrial fibrillation, previous alcohol/ tobacco use and chronic heart failure.   Echo report from 02/02/18 reviewed and showed an EF of 60-65% along with normal PA pressure.   Admitted 02/01/18 due to HF/ COPD exacerbation. Developed hyponatremia thought to be due to IV lasix. Improved when transitioned back to oral diuretics. Cardiology consult obtained. Discharged after 3 days.   He presents today for his initial visit (with his sister) with a chief complaint of minimal shortness of breath upon moderate exertion. He describes this as chronic in nature having been present for several years. He has associated fatigue, pedal edema and chronic pain along with this. He denies any difficulty sleeping, abdominal distention, palpitations, chest pain or dizziness. Most of the information was obtained by his sister that is present with him and who he lives with.   Past Medical History:  Diagnosis Date  . Alcohol use   . Arthritis   . Atrial fibrillation (HCC)   . CHF (congestive heart failure) (HCC)   . COPD (chronic obstructive pulmonary disease) (HCC)   . Coronary artery disease   . Hyperlipidemia   . Hypertension   . Stroke [facility])    Past Surgical History:  Procedure Laterality Date  . NECK SURGERY    . TONSILLECTOMY     Family History  Problem Relation Age of Onset  . Prostate cancer Neg Hx   . Bladder Cancer Neg Hx   . Kidney cancer Neg Hx    Social History   Tobacco Use  . Smoking status: Former Smoker    Last attempt to quit: 2012    Years since quitting: 7.8  . Smokeless tobacco: Never Used  Substance Use Topics  . Alcohol use: No   No Known Allergies Prior to Admission medications   Medication Sig Start Date End Date Taking? Authorizing Provider  acetaminophen (TYLENOL) 325 MG tablet  Take 2 tablets (650 mg total) by mouth every 6 (six) hours as needed for mild pain or headache. 02/04/18  Yes Gouru, Deanna Artis, MD  albuterol (PROVENTIL HFA;VENTOLIN HFA) 108 (90 Base) MCG/ACT inhaler Inhale 1-2 puffs into the lungs every 6 (six) hours as needed for wheezing or shortness of breath. 11/10/17  Yes Lyndon Code, MD  apixaban (ELIQUIS) 2.5 MG TABS tablet Take 2.5 mg by mouth 2 (two) times daily.   Yes [provider]  carvedilol (COREG) 3.125 MG tablet Take 1 tablet (3.125 mg total) by mouth 2 (two) times daily with a meal. Patient taking differently: Take 3.125 mg by mouth daily.  02/04/18  Yes Gouru, Deanna Artis, MD  cetirizine (ZYRTEC) 10 MG tablet Take 10 mg by mouth daily.   Yes [provider]  Cholecalciferol (VITAMIN D-1000 MAX ST) 1000 units tablet Take 1 tablet by mouth daily.   Yes [provider]  fluticasone (FLONASE) 50 MCG/ACT nasal spray Place 2 sprays into both nostrils daily. Patient taking differently: Place 2 sprays into both nostrils 2 (two) times daily.  01/17/18  Yes Boscia, Kathlynn Grate, NP  furosemide (LASIX) 20 MG tablet Take 1 tablet (20 mg total) by mouth daily. 02/04/18  Yes Gouru, Aruna, MD  guaiFENesin-dextromethorphan (ROBITUSSIN DM) 100-10 MG/5ML syrup Take 5 mLs by mouth every 4 (four) hours as needed for cough. 02/04/18  Yes Ramonita Lab, MD  ipratropium-albuterol (DUONEB) 0.5-2.5 (3) MG/3ML SOLN Take 3 mLs by nebulization every 6 (six) hours as needed (wheezing). 06/22/16  Yes Enid Baas, MD  lactose free nutrition (BOOST) LIQD Take 237 mLs by mouth 3 (three) times daily with meals.   Yes [provider]  lisinopril (PRINIVIL,ZESTRIL) 2.5 MG tablet Take 1 tablet (2.5 mg total) by mouth daily. 02/05/18  Yes Gouru, Deanna Artis, MD  Multiple Vitamin (MULTIVITAMIN) capsule Take by mouth.   Yes [provider]  oxyCODONE (OXY IR/ROXICODONE) 5 MG immediate release tablet Take 1 tablet (5 mg total) by mouth every 6 (six) hours  as needed for moderate pain. 06/22/16  Yes Enid Baas, MD  OXYGEN Inhale 2 L into the lungs continuous.   Yes [provider]  pramipexole (MIRAPEX) 0.5 MG tablet Take 1 tablet (0.5 mg total) by mouth 3 (three) times daily. 01/03/18  Yes [place], Coralee North, NP  SYMBICORT 160-4.5 MCG/ACT inhaler Inhale 2 puffs into the lungs 2 (two) times daily. 11/10/17  Yes Lyndon Code, MD  meloxicam (MOBIC) 7.5 MG tablet Take 1 tablet (7.5 mg total) by mouth 2 (two) times daily as needed. 01/18/18   Lyndon Code, MD    Review of Systems  Constitutional: Positive for fatigue. Negative for appetite change.  HENT: Negative for congestion, rhinorrhea and sore throat.   Eyes: Negative.   Respiratory: Positive for shortness of breath. Negative for cough and chest tightness.   Cardiovascular: Positive for leg swelling. Negative for chest pain and palpitations.  Gastrointestinal: Negative for abdominal distention and abdominal pain.  Endocrine: Negative.   Genitourinary: Negative.   Musculoskeletal: Positive for back pain and neck pain.  Skin: Negative.   Allergic/Immunologic: Negative.   Neurological: Negative for dizziness and light-headedness.  Hematological: Negative for adenopathy. Does not bruise/bleed easily.  Psychiatric/Behavioral: Negative for dysphoric mood and sleep disturbance (in a hospital bed with oxygen). The patient is not nervous/anxious.     Vitals:   02/11/18 1026  BP: 118/67  Pulse: 69  Resp: 18  SpO2: 96%  Weight: 169 lb (76.7 kg)  Height: 5\' 11"  (1.803 m)   Wt Readings from Last 3 Encounters:  02/11/18 169 lb (76.7 kg)  02/07/18 165 lb (74.8 kg)  02/04/18 163 lb (73.9 kg)   Lab Results  Component Value Date   CREATININE 0.54 (L) 02/04/2018   CREATININE 0.69 02/03/2018   CREATININE 0.55 (L) 02/02/2018    Physical Exam  Constitutional: He appears well-developed and well-nourished.  HENT:  Head: Normocephalic and atraumatic.  Neck: Neck supple. No JVD  present.  Cardiovascular: Normal rate. An irregularly irregular rhythm present.  Pulmonary/Chest: Effort normal. No respiratory distress. He has no wheezes. He has no rales.  Abdominal: Soft. He exhibits no distension.  Musculoskeletal:       Right lower leg: He exhibits edema (2+ pitting). He exhibits no tenderness.       Left lower leg: He exhibits edema (2+ pitting). He exhibits no tenderness.  Neurological: He is alert.  Skin: Skin is warm and dry.  Psychiatric: He has a normal mood and affect. His behavior is normal.  Nursing note and vitals reviewed.  Assessment & Plan:  1: Chronic heart failure with preserved ejection fraction- - NYHA class II - moderately fluid overloaded today with pedal edema (sister says edema is better than previously) - not weighing daily but does have scales. Instructed to weigh every morning after using the bathroom and call for an overnight weight gain of >2 pounds or  a weekly weight gain of >5 pounds - not adding salt and his sister says that they try to eat low sodium foods. Discussed the importance of trying to follow a 2000mg  sodium diet and written dietary information was given to her about this - has an aide that sits with him M-F from 8am to 2pm - saw cardiology Mellissa Kohut) 02/10/18 & returns in a few weeks - PharmD reconciled medications  - reports receiving his flu vaccine for this season  2: HTN- - BP looks good - sees PCP at [facility] - BMP from 02/10/18 reviewed and showed sodium 138, potassium 4.8, creatinine 0.9 and GFR 81  3: COPD- - saw pulmonologist Juanda Bond) 02/07/18 - on 4L oxygen   4: Atrial fibrillation- - apixaban 2.5mg  recently started by cardiology - holter monitor to be placed  5: Lymphedema- - stage 2 - not elevating his legs much and he was encouraged to elevate them when sitting for long periods of time; does have a hospital bed - also encouraged to get compression socks to wear daily with removal at bedtime -  unable to exercise due to stroke and COPD - consider lymphapress compression boots if edema persists  Patient did not bring his medications nor a list. Each medication was verbally reviewed with the patient and he was encouraged to bring the bottles to every visit to confirm accuracy of list.  Return in 2 months (cardiology is seeing in few weeks) or sooner for any questions/problems before then.

## 2018-02-11 NOTE — Patient Instructions (Addendum)
Begin weighing daily and call for an overnight weight gain of > 2 pounds or a weekly weight gain of >5 pounds.  Get compression socks and put them on the mornings with removal at bedtime.  Elevate legs when sitting for long periods of time.

## 2018-02-14 ENCOUNTER — Ambulatory Visit: Payer: Self-pay | Admitting: Adult Health

## 2018-02-14 ENCOUNTER — Other Ambulatory Visit: Payer: Self-pay | Admitting: Internal Medicine

## 2018-02-23 ENCOUNTER — Telehealth: Payer: Self-pay

## 2018-02-23 DIAGNOSIS — I4819 Other persistent atrial fibrillation: Secondary | ICD-10-CM | POA: Diagnosis not present

## 2018-02-23 DIAGNOSIS — I890 Lymphedema, not elsewhere classified: Secondary | ICD-10-CM | POA: Diagnosis not present

## 2018-02-23 DIAGNOSIS — I10 Essential (primary) hypertension: Secondary | ICD-10-CM | POA: Diagnosis not present

## 2018-02-23 DIAGNOSIS — I5032 Chronic diastolic (congestive) heart failure: Secondary | ICD-10-CM | POA: Diagnosis not present

## 2018-02-23 DIAGNOSIS — J449 Chronic obstructive pulmonary disease, unspecified: Secondary | ICD-10-CM | POA: Diagnosis not present

## 2018-02-23 DIAGNOSIS — I2781 Cor pulmonale (chronic): Secondary | ICD-10-CM | POA: Diagnosis not present

## 2018-02-23 NOTE — Telephone Encounter (Signed)
Patient family member called stating patient swelling in legs,feet,arms along with blisters and making abnormal breathing sounds, PER our office they have been advised to go to ER Dept due to the symptoms. Beth

## 2018-02-24 ENCOUNTER — Emergency Department: Payer: Medicare HMO

## 2018-02-24 ENCOUNTER — Inpatient Hospital Stay: Payer: Medicare HMO

## 2018-02-24 ENCOUNTER — Other Ambulatory Visit: Payer: Self-pay

## 2018-02-24 ENCOUNTER — Inpatient Hospital Stay
Admission: EM | Admit: 2018-02-24 | Discharge: 2018-02-27 | DRG: 193 | Disposition: A | Discharge status: Home / Self Care | Payer: Medicare HMO | Attending: Internal Medicine | Admitting: Internal Medicine

## 2018-02-24 DIAGNOSIS — Z8673 Personal history of transient ischemic attack (TIA), and cerebral infarction without residual deficits: Secondary | ICD-10-CM

## 2018-02-24 DIAGNOSIS — J441 Chronic obstructive pulmonary disease with (acute) exacerbation: Secondary | ICD-10-CM | POA: Diagnosis not present

## 2018-02-24 DIAGNOSIS — I2510 Atherosclerotic heart disease of native coronary artery without angina pectoris: Secondary | ICD-10-CM | POA: Diagnosis present

## 2018-02-24 DIAGNOSIS — Z7901 Long term (current) use of anticoagulants: Secondary | ICD-10-CM

## 2018-02-24 DIAGNOSIS — Y95 Nosocomial condition: Secondary | ICD-10-CM | POA: Diagnosis present

## 2018-02-24 DIAGNOSIS — E872 Acidosis: Secondary | ICD-10-CM | POA: Diagnosis present

## 2018-02-24 DIAGNOSIS — Z79899 Other long term (current) drug therapy: Secondary | ICD-10-CM

## 2018-02-24 DIAGNOSIS — I509 Heart failure, unspecified: Secondary | ICD-10-CM | POA: Diagnosis not present

## 2018-02-24 DIAGNOSIS — R0602 Shortness of breath: Secondary | ICD-10-CM

## 2018-02-24 DIAGNOSIS — E785 Hyperlipidemia, unspecified: Secondary | ICD-10-CM | POA: Diagnosis present

## 2018-02-24 DIAGNOSIS — J9622 Acute and chronic respiratory failure with hypercapnia: Secondary | ICD-10-CM | POA: Diagnosis present

## 2018-02-24 DIAGNOSIS — Z7951 Long term (current) use of inhaled steroids: Secondary | ICD-10-CM | POA: Diagnosis not present

## 2018-02-24 DIAGNOSIS — Z87891 Personal history of nicotine dependence: Secondary | ICD-10-CM

## 2018-02-24 DIAGNOSIS — J9621 Acute and chronic respiratory failure with hypoxia: Secondary | ICD-10-CM | POA: Diagnosis not present

## 2018-02-24 DIAGNOSIS — I4821 Permanent atrial fibrillation: Secondary | ICD-10-CM | POA: Diagnosis not present

## 2018-02-24 DIAGNOSIS — I110 Hypertensive heart disease with heart failure: Secondary | ICD-10-CM | POA: Diagnosis present

## 2018-02-24 DIAGNOSIS — J181 Lobar pneumonia, unspecified organism: Secondary | ICD-10-CM

## 2018-02-24 DIAGNOSIS — J189 Pneumonia, unspecified organism: Secondary | ICD-10-CM | POA: Diagnosis not present

## 2018-02-24 DIAGNOSIS — J440 Chronic obstructive pulmonary disease with acute lower respiratory infection: Secondary | ICD-10-CM | POA: Diagnosis present

## 2018-02-24 DIAGNOSIS — J90 Pleural effusion, not elsewhere classified: Secondary | ICD-10-CM | POA: Diagnosis not present

## 2018-02-24 DIAGNOSIS — Z66 Do not resuscitate: Secondary | ICD-10-CM | POA: Diagnosis present

## 2018-02-24 DIAGNOSIS — R600 Localized edema: Secondary | ICD-10-CM | POA: Diagnosis not present

## 2018-02-24 DIAGNOSIS — R918 Other nonspecific abnormal finding of lung field: Secondary | ICD-10-CM | POA: Diagnosis not present

## 2018-02-24 DIAGNOSIS — I4891 Unspecified atrial fibrillation: Secondary | ICD-10-CM | POA: Diagnosis not present

## 2018-02-24 DIAGNOSIS — J811 Chronic pulmonary edema: Secondary | ICD-10-CM | POA: Diagnosis not present

## 2018-02-24 DIAGNOSIS — J9600 Acute respiratory failure, unspecified whether with hypoxia or hypercapnia: Secondary | ICD-10-CM

## 2018-02-24 DIAGNOSIS — I10 Essential (primary) hypertension: Secondary | ICD-10-CM | POA: Diagnosis not present

## 2018-02-24 DIAGNOSIS — J9601 Acute respiratory failure with hypoxia: Secondary | ICD-10-CM | POA: Diagnosis not present

## 2018-02-24 DIAGNOSIS — R609 Edema, unspecified: Secondary | ICD-10-CM

## 2018-02-24 LAB — BASIC METABOLIC PANEL
Anion gap: 5 (ref 5–15)
BUN: 23 mg/dL (ref 8–23)
CO2: 46 mmol/L — ABNORMAL HIGH (ref 22–32)
Calcium: 9.1 mg/dL (ref 8.9–10.3)
Chloride: 84 mmol/L — ABNORMAL LOW (ref 98–111)
Creatinine, Ser: 0.45 mg/dL — ABNORMAL LOW (ref 0.61–1.24)
GFR calc Af Amer: >60 mL/min (ref >60)
GFR calc non Af Amer: >60 mL/min (ref >60)
Glucose, Bld: 113 mg/dL — ABNORMAL HIGH (ref 70–99)
Potassium: 4.3 mmol/L (ref 3.5–5.1)
Sodium: 135 mmol/L (ref 135–145)

## 2018-02-24 LAB — CBC WITH DIFFERENTIAL/PLATELET
Abs Immature Granulocytes: 0.03 10*3/uL (ref 0.00–0.07)
BASOS ABS: 0 10*3/uL (ref 0.0–0.1)
BASOS PCT: 0 %
Eosinophils Absolute: 0.1 10*3/uL (ref 0.0–0.5)
Eosinophils Relative: 1 %
HCT: 40.2 % (ref 39.0–52.0)
HEMOGLOBIN: 12.2 g/dL — AB (ref 13.0–17.0)
IMMATURE GRANULOCYTES: 0 %
LYMPHS PCT: 6 %
Lymphs Abs: 0.5 10*3/uL — ABNORMAL LOW (ref 0.7–4.0)
MCH: 29.5 pg (ref 26.0–34.0)
MCHC: 30.3 g/dL (ref 30.0–36.0)
MCV: 97.1 fL (ref 80.0–100.0)
Monocytes Absolute: 0.9 10*3/uL (ref 0.1–1.0)
Monocytes Relative: 12 %
NEUTROS ABS: 6 10*3/uL (ref 1.7–7.7)
NEUTROS PCT: 81 %
PLATELETS: 173 10*3/uL (ref 150–400)
RBC: 4.14 MIL/uL — AB (ref 4.22–5.81)
RDW: 13.2 % (ref 11.5–15.5)
WBC: 7.5 10*3/uL (ref 4.0–10.5)
nRBC: 0 % (ref 0.0–0.2)

## 2018-02-24 LAB — BLOOD GAS, ARTERIAL
Acid-Base Excess: 24.3 mmol/L — ABNORMAL HIGH (ref 0.0–2.0)
Bicarbonate: 53.8 mmol/L — ABNORMAL HIGH (ref 20.0–28.0)
FIO2: 0.35
O2 Saturation: 76 %
PCO2 ART: 83 mmHg — AB (ref 32.0–48.0)
PH ART: 7.42 (ref 7.350–7.450)
PO2 ART: 40 mmHg — AB (ref 83.0–108.0)
Patient temperature: 37

## 2018-02-24 LAB — TSH: TSH: 1.385 u[IU]/mL (ref 0.350–4.500)

## 2018-02-24 LAB — GLUCOSE, CAPILLARY
Glucose-Capillary: 90 mg/dL (ref 70–99)
Glucose-Capillary: 155 mg/dL — ABNORMAL HIGH (ref 70–99)
Glucose-Capillary: 161 mg/dL — ABNORMAL HIGH (ref 70–99)
Glucose-Capillary: 129 mg/dL — ABNORMAL HIGH (ref 70–99)

## 2018-02-24 LAB — MRSA PCR SCREENING: MRSA by PCR: NEGATIVE

## 2018-02-24 LAB — TROPONIN I
TROPONIN I: 0.03 ng/mL — AB (ref <0.03)
TROPONIN I: 0.03 ng/mL — AB (ref <0.03)
Troponin I: <0.03 ng/mL (ref <0.03)

## 2018-02-24 LAB — PROCALCITONIN: Procalcitonin: <0.1 ng/mL

## 2018-02-24 LAB — INFLUENZA PANEL BY PCR (TYPE A & B)
Influenza A By PCR: NEGATIVE
Influenza B By PCR: NEGATIVE

## 2018-02-24 LAB — BRAIN NATRIURETIC PEPTIDE: B Natriuretic Peptide: 216 pg/mL — ABNORMAL HIGH (ref 0.0–100.0)

## 2018-02-24 MED ORDER — SODIUM CHLORIDE 0.9 % IV SOLN
1.0000 g | Freq: Once | INTRAVENOUS | Status: AC
  Administered 2018-02-24: 1 g via INTRAVENOUS
  Filled 2018-02-24: qty 10

## 2018-02-24 MED ORDER — ALBUTEROL SULFATE (2.5 MG/3ML) 0.083% IN NEBU: 2.5000 mg | INHALATION_SOLUTION | RESPIRATORY_TRACT | Status: DC | PRN

## 2018-02-24 MED ORDER — ONDANSETRON HCL 4 MG PO TABS: 4.0000 mg | ORAL_TABLET | Freq: Four times a day (QID) | ORAL | Status: DC | PRN

## 2018-02-24 MED ORDER — ONDANSETRON HCL 4 MG/2ML IJ SOLN: 4.0000 mg | Freq: Four times a day (QID) | INTRAMUSCULAR | Status: DC | PRN

## 2018-02-24 MED ORDER — PHENAZOPYRIDINE HCL 200 MG PO TABS: 200.0000 mg | ORAL_TABLET | Freq: Once | ORAL | Status: DC

## 2018-02-24 MED ORDER — CARVEDILOL 3.125 MG PO TABS
3.1250 mg | ORAL_TABLET | Freq: Two times a day (BID) | ORAL | Status: DC
  Administered 2018-02-25 – 2018-02-27 (×4): 3.125 mg via ORAL
  Filled 2018-02-24 (×4): qty 1

## 2018-02-24 MED ORDER — CARVEDILOL 3.125 MG PO TABS
3.1250 mg | ORAL_TABLET | Freq: Every day | ORAL | Status: DC
  Administered 2018-02-24: 3.125 mg via ORAL
  Filled 2018-02-24: qty 1

## 2018-02-24 MED ORDER — POTASSIUM CHLORIDE CRYS ER 20 MEQ PO TBCR: 40.0000 meq | EXTENDED_RELEASE_TABLET | Freq: Once | ORAL | Status: DC

## 2018-02-24 MED ORDER — ACETAMINOPHEN 325 MG PO TABS: 650.0000 mg | ORAL_TABLET | Freq: Four times a day (QID) | ORAL | Status: DC | PRN

## 2018-02-24 MED ORDER — IPRATROPIUM-ALBUTEROL 0.5-2.5 (3) MG/3ML IN SOLN
3.0000 mL | Freq: Once | RESPIRATORY_TRACT | Status: AC
  Administered 2018-02-24: 3 mL via RESPIRATORY_TRACT
  Filled 2018-02-24: qty 3

## 2018-02-24 MED ORDER — ACETAMINOPHEN 650 MG RE SUPP: 650.0000 mg | Freq: Four times a day (QID) | RECTAL | Status: DC | PRN

## 2018-02-24 MED ORDER — BOOST PO LIQD
237.0000 mL | Freq: Three times a day (TID) | ORAL | Status: DC
  Administered 2018-02-24 – 2018-02-27 (×2): 237 mL via ORAL
  Filled 2018-02-24: qty 237

## 2018-02-24 MED ORDER — AMMONIUM LACTATE 12 % EX LOTN
TOPICAL_LOTION | Freq: Two times a day (BID) | CUTANEOUS | Status: DC
  Administered 2018-02-24: 1 via TOPICAL
  Administered 2018-02-25 – 2018-02-26 (×3): via TOPICAL
  Filled 2018-02-24: qty 400

## 2018-02-24 MED ORDER — INSULIN ASPART 100 UNIT/ML ~~LOC~~ SOLN
0.0000 [IU] | Freq: Three times a day (TID) | SUBCUTANEOUS | Status: DC
  Administered 2018-02-24: 2 [IU] via SUBCUTANEOUS
  Administered 2018-02-25 (×2): 1 [IU] via SUBCUTANEOUS
  Administered 2018-02-26: 2 [IU] via SUBCUTANEOUS
  Filled 2018-02-24 (×3): qty 1

## 2018-02-24 MED ORDER — METHYLPREDNISOLONE SODIUM SUCC 40 MG IJ SOLR
40.0000 mg | Freq: Two times a day (BID) | INTRAMUSCULAR | Status: DC
  Administered 2018-02-24 – 2018-02-26 (×3): 40 mg via INTRAVENOUS
  Filled 2018-02-24 (×4): qty 1

## 2018-02-24 MED ORDER — METHYLPREDNISOLONE SODIUM SUCC 125 MG IJ SOLR
125.0000 mg | Freq: Once | INTRAMUSCULAR | Status: AC
  Administered 2018-02-24: 125 mg via INTRAVENOUS
  Filled 2018-02-24: qty 2

## 2018-02-24 MED ORDER — APIXABAN 5 MG PO TABS
5.0000 mg | ORAL_TABLET | Freq: Two times a day (BID) | ORAL | Status: DC
  Administered 2018-02-24 – 2018-02-26 (×6): 5 mg via ORAL
  Filled 2018-02-24 (×6): qty 1

## 2018-02-24 MED ORDER — DOCUSATE SODIUM 100 MG PO CAPS
100.0000 mg | ORAL_CAPSULE | Freq: Two times a day (BID) | ORAL | Status: DC
  Administered 2018-02-24 – 2018-02-26 (×6): 100 mg via ORAL
  Filled 2018-02-24 (×6): qty 1

## 2018-02-24 MED ORDER — CLOTRIMAZOLE 1 % EX CREA
TOPICAL_CREAM | Freq: Two times a day (BID) | CUTANEOUS | Status: DC
  Administered 2018-02-24: 1 via TOPICAL
  Administered 2018-02-25 – 2018-02-26 (×4): via TOPICAL
  Filled 2018-02-24: qty 15

## 2018-02-24 MED ORDER — MEDROXYPROGESTERONE ACETATE 10 MG PO TABS
10.0000 mg | ORAL_TABLET | Freq: Every day | ORAL | Status: DC
  Administered 2018-02-25: 10 mg via ORAL
  Filled 2018-02-24 (×2): qty 1

## 2018-02-24 MED ORDER — OXYCODONE HCL 5 MG PO TABS: 5.0000 mg | ORAL_TABLET | Freq: Four times a day (QID) | ORAL | Status: DC | PRN

## 2018-02-24 MED ORDER — FUROSEMIDE 10 MG/ML IJ SOLN
20.0000 mg | Freq: Two times a day (BID) | INTRAMUSCULAR | Status: DC
  Administered 2018-02-24 – 2018-02-27 (×6): 20 mg via INTRAVENOUS
  Filled 2018-02-24 (×6): qty 2

## 2018-02-24 MED ORDER — APIXABAN 2.5 MG PO TABS: 2.5000 mg | ORAL_TABLET | Freq: Two times a day (BID) | ORAL | Status: DC

## 2018-02-24 MED ORDER — FLUTICASONE PROPIONATE 50 MCG/ACT NA SUSP
2.0000 | Freq: Every day | NASAL | Status: DC
  Administered 2018-02-24 – 2018-02-26 (×3): 2 via NASAL
  Filled 2018-02-24: qty 16

## 2018-02-24 MED ORDER — FUROSEMIDE 10 MG/ML IJ SOLN
INTRAMUSCULAR | Status: AC
  Filled 2018-02-24: qty 4

## 2018-02-24 MED ORDER — VITAMIN D3 25 MCG (1000 UNIT) PO TABS
1000.0000 [IU] | ORAL_TABLET | Freq: Every day | ORAL | Status: DC
  Administered 2018-02-24 – 2018-02-26 (×3): 1000 [IU] via ORAL
  Filled 2018-02-24 (×5): qty 1

## 2018-02-24 MED ORDER — TOLNAFTATE 1 % EX CREA: TOPICAL_CREAM | Freq: Two times a day (BID) | CUTANEOUS | Status: DC

## 2018-02-24 MED ORDER — INSULIN ASPART 100 UNIT/ML ~~LOC~~ SOLN
0.0000 [IU] | Freq: Three times a day (TID) | SUBCUTANEOUS | Status: DC
  Administered 2018-02-24: 2 [IU] via SUBCUTANEOUS
  Filled 2018-02-24: qty 1

## 2018-02-24 MED ORDER — ACETAZOLAMIDE SODIUM 500 MG IJ SOLR
250.0000 mg | Freq: Once | INTRAMUSCULAR | Status: AC
  Administered 2018-02-24: 250 mg via INTRAVENOUS
  Filled 2018-02-24: qty 250

## 2018-02-24 MED ORDER — FUROSEMIDE 10 MG/ML IJ SOLN: 40.0000 mg | Freq: Once | INTRAMUSCULAR | Status: DC

## 2018-02-24 MED ORDER — VANCOMYCIN HCL IN DEXTROSE 1-5 GM/200ML-% IV SOLN
1000.0000 mg | Freq: Once | INTRAVENOUS | Status: AC
  Administered 2018-02-24: 1000 mg via INTRAVENOUS
  Filled 2018-02-24: qty 200

## 2018-02-24 MED ORDER — ORAL CARE MOUTH RINSE
15.0000 mL | Freq: Two times a day (BID) | OROMUCOSAL | Status: DC
  Administered 2018-02-24 – 2018-02-26 (×4): 15 mL via OROMUCOSAL

## 2018-02-24 MED ORDER — LORATADINE 10 MG PO TABS
10.0000 mg | ORAL_TABLET | Freq: Every day | ORAL | Status: DC
  Administered 2018-02-24 – 2018-02-26 (×3): 10 mg via ORAL
  Filled 2018-02-24 (×3): qty 1

## 2018-02-24 MED ORDER — LISINOPRIL 5 MG PO TABS
2.5000 mg | ORAL_TABLET | Freq: Every day | ORAL | Status: DC
  Administered 2018-02-24 – 2018-02-26 (×3): 2.5 mg via ORAL
  Filled 2018-02-24 (×3): qty 1

## 2018-02-24 MED ORDER — SODIUM CHLORIDE 0.9 % IV SOLN
500.0000 mg | Freq: Once | INTRAVENOUS | Status: AC
  Administered 2018-02-24: 500 mg via INTRAVENOUS
  Filled 2018-02-24: qty 500

## 2018-02-24 MED ORDER — PREDNISONE 20 MG PO TABS
50.0000 mg | ORAL_TABLET | Freq: Every day | ORAL | Status: DC
  Administered 2018-02-24: 50 mg via ORAL
  Filled 2018-02-24: qty 3

## 2018-02-24 MED ORDER — FUROSEMIDE 10 MG/ML IJ SOLN
40.0000 mg | Freq: Once | INTRAMUSCULAR | Status: AC
  Administered 2018-02-24: 40 mg via INTRAVENOUS

## 2018-02-24 MED ORDER — INSULIN ASPART 100 UNIT/ML ~~LOC~~ SOLN: 0.0000 [IU] | Freq: Every day | SUBCUTANEOUS | Status: DC

## 2018-02-24 MED ORDER — IPRATROPIUM-ALBUTEROL 0.5-2.5 (3) MG/3ML IN SOLN
3.0000 mL | Freq: Once | RESPIRATORY_TRACT | Status: AC
  Administered 2018-02-24: 3 mL via RESPIRATORY_TRACT

## 2018-02-24 MED ORDER — CHLORHEXIDINE GLUCONATE 0.12 % MT SOLN
15.0000 mL | Freq: Two times a day (BID) | OROMUCOSAL | Status: DC
  Administered 2018-02-24 – 2018-02-26 (×6): 15 mL via OROMUCOSAL
  Filled 2018-02-24 (×5): qty 15

## 2018-02-24 MED ORDER — AZITHROMYCIN 250 MG PO TABS
250.0000 mg | ORAL_TABLET | Freq: Every day | ORAL | Status: DC
  Administered 2018-02-25 – 2018-02-26 (×2): 250 mg via ORAL
  Filled 2018-02-24 (×2): qty 1

## 2018-02-24 MED ORDER — VANCOMYCIN HCL IN DEXTROSE 1-5 GM/200ML-% IV SOLN
1000.0000 mg | Freq: Two times a day (BID) | INTRAVENOUS | Status: DC
  Filled 2018-02-24: qty 200

## 2018-02-24 MED ORDER — FUROSEMIDE 20 MG PO TABS
40.0000 mg | ORAL_TABLET | Freq: Every day | ORAL | Status: DC
  Administered 2018-02-24: 40 mg via ORAL
  Filled 2018-02-24: qty 2

## 2018-02-24 MED ORDER — PRAMIPEXOLE DIHYDROCHLORIDE 0.25 MG PO TABS
0.5000 mg | ORAL_TABLET | Freq: Three times a day (TID) | ORAL | Status: DC
  Administered 2018-02-24 – 2018-02-26 (×8): 0.5 mg via ORAL
  Filled 2018-02-24 (×9): qty 2

## 2018-02-24 MED ORDER — IPRATROPIUM-ALBUTEROL 0.5-2.5 (3) MG/3ML IN SOLN
3.0000 mL | Freq: Four times a day (QID) | RESPIRATORY_TRACT | Status: DC
  Administered 2018-02-24 – 2018-02-26 (×10): 3 mL via RESPIRATORY_TRACT
  Filled 2018-02-24 (×11): qty 3

## 2018-02-24 MED ORDER — FLUTICASONE FUROATE-VILANTEROL 200-25 MCG/INH IN AEPB
1.0000 | INHALATION_SPRAY | Freq: Every day | RESPIRATORY_TRACT | Status: DC
  Administered 2018-02-24 – 2018-02-26 (×3): 1 via RESPIRATORY_TRACT
  Filled 2018-02-24: qty 28

## 2018-02-24 NOTE — ED Notes (Signed)
RT placed pt on bi-pap at this time, pt tolerating

## 2018-02-24 NOTE — Progress Notes (Signed)
Pharmacy Antibiotic Note  Scott EasterlyWilbur G Richard is a 82 y.o. male admitted on 02/24/2018 with pneumonia.  Pharmacy has been consulted for vancomycin dosing.  Plan: DW 77kg  Vd 54L kei 0.064 hr-1  T1/2 11 hours Vancomycin 1 gram q 12 hours ordered with stacked dosing. Level before 5th dose. Goal trough 15-20.  Height: 5\' 6"  (167.6 cm) Weight: 169 lb 12.1 oz (77 kg) IBW/kg (Calculated) : 63.8  Temp (24hrs), Avg:97.1 F (36.2 C), Min:97.1 F (36.2 C), Max:97.1 F (36.2 C)  Recent Labs  Lab 02/24/18 0420 02/24/18 0421  WBC 7.5  --   CREATININE  --  0.45*    Estimated Creatinine Clearance: 72 mL/min (A) (by C-G formula based on SCr of 0.45 mg/dL (L)).    No Known Allergies  Antimicrobials this admission: ceftriaxone, azithromycin x1; Vancomycin,   >>    >>   Dose adjustments this admission:   Microbiology results: No micro      11/14 CXR:  L base opacification Thank you for allowing pharmacy to be a part of this patient's care.  Bowe Sidor S 02/24/2018 6:51 AM

## 2018-02-24 NOTE — Care Management Note (Signed)
Case Management Note  Patient Details  Name: Scott Richard MRN: [id number] Date of Birth: [birth year]  Subjective/Objective:     Patient is admitted with HCAP, currently in the ICU on bipap.  Patient has history of COPD and CHF.  Patient is open with [facility] for RN, PT, and REDs vest.  Scott Richard with [facility] is aware of patient's admission.  RNCM consult for NIV.  Scott Richard with [facility] will assess to see if patient will qualify for NIV.   RNCM will cont to follow. Scott LisJeanna Mckinlee Dunk RN, BSN [phone]                Action/Plan:   Expected Discharge Date:                  Expected Discharge Plan:     In-House Referral:     Discharge planning Services     Post Acute Care Choice:    Choice offered to:     DME Arranged:    DME Agency:     HH Arranged:    HH Agency:     Status of Service:     If discussed at Long Length of Stay Meetings, dates discussed:    Additional Comments:  Scott ButcherJeanna M Taequan Stockhausen, RN 02/24/2018, 10:05 AM

## 2018-02-24 NOTE — Progress Notes (Signed)
Pt remains on and off Bipap due to acute on chronic hypoxic hypercapnic respiratory failure.  Pt and pts sister had a discussion regarding pts code status.  They have decided to change pts code status to DO NOT RESUSCITATE.  Therefore, pt now a DNR will continue to monitor and assess pt.  Sonda Rumbleana Zelma Mazariego, AGNP  Pulmonary/Critical Care Pager [phone] (please enter 7 digits) PCCM Consult Pager [phone] (please enter 7 digits)

## 2018-02-24 NOTE — Progress Notes (Signed)
Patient ID: Scott EasterlyWilbur G Sabet, male   DOB: [birth year], 82 y.o.   MRN: [id number]  ACP note  Patient and 2 sisters at the bedside.  Diagnosis: Acute hypoxic hypercarbic respiratory failure requiring BiPAP, COPD exacerbation, pneumonia, atrial fibrillation, hypertension, chronic diastolic congestive heart failure, fungal infection left foot, lower extremity edema.  Plan.  Patient required BiPAP initially on presentation and now on high flow nasal cannula.  Diuresis with Lasix IV twice daily for lower extremity edema and blistering.  Antibiotics for pneumonia.  Steroids and nebulizers for COPD exacerbation.  Patient on Eliquis for anti-coagulation.  Care manager to look into noninvasive ventilation at home for nighttime and that periods of shortness of breath.  CODE STATUS discussed.  Patient is a full code.  Time spent on ACP discussion 30 minutes Dr. Alford Highlandichard Daveda Larock

## 2018-02-24 NOTE — Consult Note (Signed)
Name: Irven EasterlyWilbur G Ashline MRN: [id number] DOB: [birth year]    ADMISSION DATE:  02/24/2018 CONSULTATION DATE: 02/24/2018  REFERRING MD : Dr. Sheryle Hailiamond   CHIEF COMPLAINT: Shortness of Breath   BRIEF PATIENT DESCRIPTION: 82 yo male admitted with acute on chronic hypoxic hypercapnic respiratory failure secondary to acute CHF exacerbation and AECOPD requiring Bipap  SIGNIFICANT EVENTS  11/14-Pt admitted to the stepdown unit on Bipap   STUDIES:  None   HISTORY OF PRESENT ILLNESS:   This is an 82 yo male with a PMH of CVA, HTN, Hyperlipidemia, CAD, COPD, Home O2 @ 3.5-4L via nasal canula, Atrial Fibrillation (on eliquis), Arthritis, ETOH Abuse, and Chronic Diastolic CHF.  [facility] ER on 11/14 via EMS from home with c/o productive cough and shortness of breath.  Per ER notes the pt saw his Cardiologist Dr. Darrold JunkerParaschos on 11/13 due to symptoms, and his lasix dose was increased to 40 mg daily, however symptoms worsened prompting ER visit.  Upon arrival to the ER pts O2 sats on 4L via nasal canula was 84%, therefore he was transitioned to Bipap.  CXR showed small left pleural effusion and possible pneumonia.  Lab results revealed BNP 216, PCT <0.10, and vbg pH 7.37/pCO2 102.  He was subsequently admitted to the stepdown unit for additional workup and treatment.   PAST MEDICAL HISTORY :   has a past medical history of Alcohol use, Arthritis, Atrial fibrillation (HCC), CHF (congestive heart failure) (HCC), COPD (chronic obstructive pulmonary disease) (HCC), Coronary artery disease, Hyperlipidemia, Hypertension, and Stroke (HCC).  has a past surgical history that includes Neck surgery and Tonsillectomy. Prior to Admission medications   Medication Sig Start Date End Date Taking? Authorizing Provider  acetaminophen (TYLENOL) 325 MG tablet Take 2 tablets (650 mg total) by mouth every 6 (six) hours as needed for mild pain or headache. 02/04/18  Yes Gouru, Deanna ArtisAruna, MD  apixaban (ELIQUIS) 2.5 MG TABS  tablet Take 2.5 mg by mouth 2 (two) times daily.   Yes [provider]  carvedilol (COREG) 3.125 MG tablet Take 1 tablet (3.125 mg total) by mouth 2 (two) times daily with a meal. Patient taking differently: Take 3.125 mg by mouth daily.  02/04/18  Yes Gouru, Deanna ArtisAruna, MD  Cholecalciferol (VITAMIN D-1000 MAX ST) 1000 units tablet Take 1 tablet by mouth daily.   Yes [provider]  fluticasone (FLONASE) 50 MCG/ACT nasal spray Place 2 sprays into both nostrils daily. Patient taking differently: Place 2 sprays into both nostrils 2 (two) times daily.  01/17/18  Yes Boscia, Kathlynn GrateHeather E, NP  furosemide (LASIX) 20 MG tablet Take 1 tablet (20 mg total) by mouth daily. Patient taking differently: Take 40 mg by mouth daily.  02/04/18  Yes Gouru, Deanna ArtisAruna, MD  lisinopril (PRINIVIL,ZESTRIL) 2.5 MG tablet Take 1 tablet (2.5 mg total) by mouth daily. 02/05/18  Yes Gouru, Deanna ArtisAruna, MD  Multiple Vitamin (MULTIVITAMIN) capsule Take by mouth.   Yes [provider]  oxyCODONE (OXY IR/ROXICODONE) 5 MG immediate release tablet Take 1 tablet (5 mg total) by mouth every 6 (six) hours as needed for moderate pain. 06/22/16  Yes Enid BaasKalisetti, Radhika, MD  pramipexole (MIRAPEX) 0.5 MG tablet Take 1 tablet (0.5 mg total) by mouth 3 (three) times daily. 01/03/18  Yes Scarboro, Coralee NorthAdam J, NP  SYMBICORT 160-4.5 MCG/ACT inhaler Inhale 2 puffs into the lungs 2 (two) times daily. 11/10/17  Yes Lyndon CodeKhan, Fozia M, MD  VENTOLIN HFA 108 [phone](90 Base) MCG/ACT inhaler Inhale 1-2 puffs into the lungs every  6 (six) hours as needed for wheezing or shortness of breath. 02/14/18  Yes Boscia, Kathlynn Grate, NP  cetirizine (ZYRTEC) 10 MG tablet Take 10 mg by mouth daily.    [provider]  guaiFENesin-dextromethorphan (ROBITUSSIN DM) 100-10 MG/5ML syrup Take 5 mLs by mouth every 4 (four) hours as needed for cough. Patient not taking: Reported on 02/24/2018 02/04/18   Ramonita Lab, MD  ipratropium-albuterol (DUONEB) 0.5-2.5 (3) MG/3ML SOLN  Take 3 mLs by nebulization every 6 (six) hours as needed (wheezing). Patient not taking: Reported on 02/24/2018 06/22/16   Enid Baas, MD  lactose free nutrition (BOOST) LIQD Take 237 mLs by mouth 3 (three) times daily with meals.    [provider]  meloxicam (MOBIC) 7.5 MG tablet Take 1 tablet (7.5 mg total) by mouth 2 (two) times daily as needed. Patient not taking: Reported on 02/24/2018 02/14/18   Johnna Acosta, NP  OXYGEN Inhale 2 L into the lungs continuous.    [provider]   No Known Allergies  FAMILY HISTORY:  family history is not on file. SOCIAL HISTORY:  reports that he quit smoking about 7 years ago. He has never used smokeless tobacco. He reports that he does not drink alcohol or use drugs.  REVIEW OF SYSTEMS: Positives in BOLD  Constitutional: Negative for fever, chills, weight loss, malaise/fatigue and diaphoresis.  HENT: hearing loss, ear pain, nosebleeds, congestion, sore throat, neck pain, tinnitus and ear discharge.   Eyes: Negative for blurred vision, double vision, photophobia, pain, discharge and redness.  Respiratory: cough, hemoptysis, sputum production, shortness of breath, wheezing and stridor.   Cardiovascular: Negative for chest pain, palpitations, orthopnea, claudication, leg swelling and PND.  Gastrointestinal: Negative for heartburn, nausea, vomiting, abdominal pain, diarrhea, constipation, blood in stool and melena.  Genitourinary: Negative for dysuria, urgency, frequency, hematuria and flank pain.  Musculoskeletal: Negative for myalgias, back pain, joint pain and falls.  Skin: Negative for itching and rash.  Neurological: Negative for dizziness, tingling, tremors, sensory change, speech change, focal weakness, seizures, loss of consciousness, weakness and headaches.  Endo/Heme/Allergies: Negative for environmental allergies and polydipsia. Does not bruise/bleed easily.  SUBJECTIVE:  Pt currently on 3L O2 via nasal canula he  states his breathing has improved   VITAL SIGNS: Temp:  [97.1 F (36.2 C)-97.3 F (36.3 C)] 97.3 F (36.3 C) (11/14 0833) Pulse Rate:  [54-83] 72 (11/14 0858) Resp:  [18-28] 23 (11/14 0858) BP: (111-150)/(56-98) 150/82 (11/14 0833) SpO2:  [84 %-100 %] 90 % (11/14 0858) FiO2 (%):  [30 %-35 %] 35 % (11/14 0858) Weight:  [72.1 kg-77 kg] 72.1 kg (11/14 0833)  PHYSICAL EXAMINATION: General: well developed, well nourished male, NAD  Neuro: alert and oriented, follows commands HEENT: supple, no JVD Cardiovascular: irregular irregular, no R/G Lungs: diffuse crackles with faint expiratory wheezes throughout, even, non labored  Abdomen: +BS x4, soft, non tender, non distended  Musculoskeletal: normal bulk and tone, 3+ bilateral lower extremity edema  Skin: intact no rashes or lesions   Recent Labs  Lab 02/24/18 0421  NA 135  K 4.3  CL 84*  CO2 46*  BUN 23  CREATININE 0.45*  GLUCOSE 113*   Recent Labs  Lab 02/24/18 0420  HGB 12.2*  HCT 40.2  WBC 7.5  PLT 173   Dg Chest Port 1 View  Result Date: 02/24/2018 CLINICAL DATA:  Acute onset of dyspnea. EXAM: PORTABLE CHEST 1 VIEW COMPARISON:  Chest radiograph performed 02/01/2018 FINDINGS: The lungs are hypoexpanded. A small left pleural  effusion is noted. There is elevation of the right hemidiaphragm. Vascular crowding is noted. Left basilar airspace opacification could reflect pneumonia, depending on the patient's symptoms. There is no evidence of pneumothorax. The cardiomediastinal silhouette is within normal limits. No acute osseous abnormalities are seen. Cervical spinal fusion hardware is partially imaged. IMPRESSION: Lungs hypoexpanded. Small left pleural effusion noted. Left basilar airspace opacification could reflect pneumonia, depending on the patient's symptoms. Electronically Signed   By: Roanna Raider M.D.   On: 02/24/2018 04:39    ASSESSMENT / PLAN:  Acute on chronic hypoxic hypercapnic respiratory failure secondary to  Acute CHF exacerbation and AECOPD  CXR reviewed no signs of pneumonia  Prn Bipap for dyspnea and/or hypoxia  Scheduled and prn bronchodilator therapy Continue prednisone Repeat ABG pending r/o influenza  Trend WBC and monitor fever curve Trend PCT  Obtain respiratory culture  Azithromycin x4 doses   Acute on chronic diastolic CHF exacerbation Chronic atrial fibrillation HTN Hx: Hyperlipidemia, CAD, and CVA Continuous telemetry monitoring  Will check troponin Diamox x1 dose  Continue lisinopril, carvedilol, and lasix Cardiologist consulted appreciate input   Hyperglycemia CBG's ac/hs SSI   Sonda Rumble, AGNP  Pulmonary/Critical Care Pager [phone] (please enter 7 digits) PCCM Consult Pager [phone] (please enter 7 digits)

## 2018-02-24 NOTE — ED Provider Notes (Signed)
[facility] Emergency Department Provider Note  ____________________________________________   First MD Initiated Contact with Patient 02/24/18 [phone]     (approximate)  I have reviewed the triage vital signs and the nursing notes.  Level 5 caveat: Patient is a very poor historian HISTORY  Chief Complaint Shortness of Breath and Sore Throat   HPI Scott Richard is a 82 y.o. male with below list of chronic medical conditions including atrial fibrillation congestive heart failure and COPD presents to the emergency department with progressive dyspnea x3 days.  Patient denies any chest pain however does admit to bilateral lower extremity swelling.  Per EMS patient was seen by his cardiologist yesterday and advised to increase his Lasix which patient states that he has not done yet.  Patient oxygen level on my arrival to room 84% on 4 L via nasal cannula.  Patient denies any fever  Past Medical History:  Diagnosis Date  . Alcohol use   . Arthritis   . Atrial fibrillation (HCC)   . CHF (congestive heart failure) (HCC)   . COPD (chronic obstructive pulmonary disease) (HCC)   . Coronary artery disease   . Hyperlipidemia   . Hypertension   . Stroke [facility])     Patient Active Problem List   Diagnosis Date Noted  . Chronic diastolic heart failure (HCC) 02/11/2018  . Atrial fibrillation (HCC) 02/11/2018  . Lymphedema 02/11/2018  . Protein-calorie malnutrition, severe 02/03/2018  . Pressure injury of skin 02/02/2018  . Primary generalized (osteo)arthritis 04/19/2017  . Weakness 04/19/2017  . Difficulty in walking, not elsewhere classified 04/19/2017  . Dependence on supplemental oxygen 04/19/2017  . Edema, unspecified 04/19/2017  . Cor pulmonale (chronic) (HCC) 04/19/2017  . Chronic pain syndrome 04/19/2017  . Hypercalcemia 04/19/2017  . Occlusion and stenosis of bilateral carotid arteries 04/19/2017  . Tremor, unspecified 04/19/2017  . Nicotine dependence,  cigarettes, in remission 04/19/2017  . Allergic rhinitis, unspecified 04/19/2017  . Emphysema, unspecified (HCC) 04/19/2017  . Mixed hyperlipidemia 04/19/2017  . Generalized anxiety disorder 04/19/2017  . Cough 04/19/2017  . Essential hypertension 10/22/2016  . CHF exacerbation (HCC) 10/22/2016  . COPD exacerbation (HCC) 06/17/2016    Past Surgical History:  Procedure Laterality Date  . NECK SURGERY    . TONSILLECTOMY      Prior to Admission medications   Medication Sig Start Date End Date Taking? Authorizing Provider  acetaminophen (TYLENOL) 325 MG tablet Take 2 tablets (650 mg total) by mouth every 6 (six) hours as needed for mild pain or headache. 02/04/18  Yes Gouru, Deanna Artis, MD  apixaban (ELIQUIS) 2.5 MG TABS tablet Take 2.5 mg by mouth 2 (two) times daily.   Yes [provider]  carvedilol (COREG) 3.125 MG tablet Take 1 tablet (3.125 mg total) by mouth 2 (two) times daily with a meal. Patient taking differently: Take 3.125 mg by mouth daily.  02/04/18  Yes Gouru, Deanna Artis, MD  Cholecalciferol (VITAMIN D-1000 MAX ST) 1000 units tablet Take 1 tablet by mouth daily.   Yes [provider]  fluticasone (FLONASE) 50 MCG/ACT nasal spray Place 2 sprays into both nostrils daily. Patient taking differently: Place 2 sprays into both nostrils 2 (two) times daily.  01/17/18  Yes Boscia, Kathlynn Grate, NP  furosemide (LASIX) 20 MG tablet Take 1 tablet (20 mg total) by mouth daily. Patient taking differently: Take 40 mg by mouth daily.  02/04/18  Yes Gouru, Deanna Artis, MD  lisinopril (PRINIVIL,ZESTRIL) 2.5 MG tablet Take 1 tablet (2.5 mg total) by  mouth daily. 02/05/18  Yes Gouru, Deanna Artis, MD  Multiple Vitamin (MULTIVITAMIN) capsule Take by mouth.   Yes [provider]  oxyCODONE (OXY IR/ROXICODONE) 5 MG immediate release tablet Take 1 tablet (5 mg total) by mouth every 6 (six) hours as needed for moderate pain. 06/22/16  Yes Enid Baas, MD  pramipexole (MIRAPEX) 0.5 MG tablet  Take 1 tablet (0.5 mg total) by mouth 3 (three) times daily. 01/03/18  Yes Scarboro, Coralee North, NP  SYMBICORT 160-4.5 MCG/ACT inhaler Inhale 2 puffs into the lungs 2 (two) times daily. 11/10/17  Yes Lyndon Code, MD  VENTOLIN HFA 108 [phone] Base) MCG/ACT inhaler Inhale 1-2 puffs into the lungs every 6 (six) hours as needed for wheezing or shortness of breath. 02/14/18  Yes Boscia, Kathlynn Grate, NP  cetirizine (ZYRTEC) 10 MG tablet Take 10 mg by mouth daily.    [provider]  guaiFENesin-dextromethorphan (ROBITUSSIN DM) 100-10 MG/5ML syrup Take 5 mLs by mouth every 4 (four) hours as needed for cough. Patient not taking: Reported on 02/24/2018 02/04/18   Ramonita Lab, MD  ipratropium-albuterol (DUONEB) 0.5-2.5 (3) MG/3ML SOLN Take 3 mLs by nebulization every 6 (six) hours as needed (wheezing). Patient not taking: Reported on 02/24/2018 06/22/16   Enid Baas, MD  lactose free nutrition (BOOST) LIQD Take 237 mLs by mouth 3 (three) times daily with meals.    [provider]  meloxicam (MOBIC) 7.5 MG tablet Take 1 tablet (7.5 mg total) by mouth 2 (two) times daily as needed. Patient not taking: Reported on 02/24/2018 02/14/18   Johnna Acosta, NP  OXYGEN Inhale 2 L into the lungs continuous.    [provider]    Allergies No known drug allergies  Family History  Problem Relation Age of Onset  . Prostate cancer Neg Hx   . Bladder Cancer Neg Hx   . Kidney cancer Neg Hx     Social History Social History   Tobacco Use  . Smoking status: Former Smoker    Last attempt to quit: 2012    Years since quitting: 7.8  . Smokeless tobacco: Never Used  Substance Use Topics  . Alcohol use: No  . Drug use: No    Review of Systems Constitutional: No fever/chills Eyes: No visual changes. ENT: No sore throat. Cardiovascular: Denies chest pain. Respiratory: Positive for cough and dyspnea. Gastrointestinal: No abdominal pain.  No nausea, no vomiting.  No diarrhea.  No  constipation. Genitourinary: Negative for dysuria. Musculoskeletal: Negative for neck pain.  Negative for back pain. Integumentary: Negative for rash. Neurological: Negative for headaches, focal weakness or numbness.   ____________________________________________   PHYSICAL EXAM:  VITAL SIGNS: ED Triage Vitals  Enc Vitals Group     BP 02/24/18 0412 (!) 137/98     Pulse Rate 02/24/18 0412 68     Resp 02/24/18 0412 20     Temp 02/24/18 0412 (!) 97.1 F (36.2 C)     Temp Source 02/24/18 0412 Oral     SpO2 02/24/18 0412 90 %     Weight 02/24/18 0413 77 kg (169 lb 12.1 oz)     Height 02/24/18 0413 1.676 m (5\' 6" )     Head Circumference --      Peak Flow --      Pain Score 02/24/18 0413 5     Pain Loc --      Pain Edu? --      Excl. in GC? --     Constitutional: Alert and oriented.  Apparent respiratory distress eyes: Conjunctivae are normal.. Nose: No congestion/rhinnorhea. Mouth/Throat: Mucous membranes are moist.  Oropharynx non-erythematous. Neck: No stridor.   Cardiovascular: Normal rate, regular rhythm. Good peripheral circulation. Grossly normal heart sounds. Respiratory: Apparent respiratory distress, positive accessory respiratory muscle use, diffuse rhonchi. Gastrointestinal: Soft and nontender. No distention.  Musculoskeletal: 2+ lower extremity pitting edema. No gross deformities of extremities. Neurologic:  Normal speech and language. No gross focal neurologic deficits are appreciated.  Skin:  Skin is warm, dry and intact. No rash noted. Psychiatric: Mood and affect are normal. Speech and behavior are normal.  ____________________________________________   LABS (all labs ordered are listed, but only abnormal results are displayed)  Labs Reviewed  CBC WITH DIFFERENTIAL/PLATELET - Abnormal; Notable for the following components:      Result Value   RBC 4.14 (*)    Hemoglobin 12.2 (*)    Lymphs Abs 0.5 (*)    All other components within normal limits  BRAIN  NATRIURETIC PEPTIDE - Abnormal; Notable for the following components:   B Natriuretic Peptide 216.0 (*)    All other components within normal limits  BASIC METABOLIC PANEL - Abnormal; Notable for the following components:   Chloride 84 (*)    CO2 46 (*)    Glucose, Bld 113 (*)    Creatinine, Ser 0.45 (*)    All other components within normal limits  BLOOD GAS, VENOUS - Abnormal; Notable for the following components:   pCO2, Ven 102 (*)    Bicarbonate 59.0 (*)    Acid-Base Excess 27.3 (*)    All other components within normal limits   ____________________________________________  EKG  ED ECG REPORT I, [place] N Alastair Hennes, the attending physician, personally viewed and interpreted this ECG.   Date: 02/24/2018  EKG Time: 4:51 AM  Rate: 73  Rhythm: Normal sinus rhythm  Axis: Normal  Intervals: Normal  ST&T Change: None  ____________________________________________  RADIOLOGY I, [place] N Kenidee Cregan, personally viewed and evaluated these images (plain radiographs) as part of my medical decision making, as well as reviewing the written report by the radiologist.  ED MD interpretation: Lungs hypoexpanded small left pleural effusion noted with possible left lower lobe pneumonia per radiologist.  Official radiology report(s): Dg Chest Port 1 View  Result Date: 02/24/2018 CLINICAL DATA:  Acute onset of dyspnea. EXAM: PORTABLE CHEST 1 VIEW COMPARISON:  Chest radiograph performed 02/01/2018 FINDINGS: The lungs are hypoexpanded. A small left pleural effusion is noted. There is elevation of the right hemidiaphragm. Vascular crowding is noted. Left basilar airspace opacification could reflect pneumonia, depending on the patient's symptoms. There is no evidence of pneumothorax. The cardiomediastinal silhouette is within normal limits. No acute osseous abnormalities are seen. Cervical spinal fusion hardware is partially imaged. IMPRESSION: Lungs hypoexpanded. Small left pleural effusion noted. Left  basilar airspace opacification could reflect pneumonia, depending on the patient's symptoms. Electronically Signed   By: Roanna RaiderJeffery  Chang M.D.   On: 02/24/2018 04:39    ____________________________________________    Critical Care performed:   .Critical Care Performed by: Darci CurrentBrown, [place] N, MD Authorized by: Darci CurrentBrown,  N, MD   Critical care provider statement:    Critical care time (minutes):  30   Critical care time was exclusive of:  Separately billable procedures and treating other patients and teaching time   Critical care was necessary to treat or prevent imminent or life-threatening deterioration of the following conditions:  Respiratory failure   Critical care was time spent personally by me on the following activities:  Development of treatment plan with patient or surrogate, discussions with consultants, evaluation of patient's response to treatment, examination of patient, obtaining history from patient or surrogate, ordering and performing treatments and interventions, ordering and review of laboratory studies, ordering and review of radiographic studies, pulse oximetry, re-evaluation of patient's condition and review of old charts   I assumed direction of critical care for this patient from another provider in my specialty: no       ____________________________________________   INITIAL IMPRESSION / ASSESSMENT AND PLAN / ED COURSE  As part of my medical decision making, I reviewed the following data within the electronic MEDICAL RECORD [id number] year old male presenting with above-stated history and physical exam secondary to respiratory distress.  Concern for possible COPD exacerbation pneumonia or CHF exacerbation.  Chest x-ray revealed possible left lower lobe pneumonia however no gross pulmonary edema.  Laboratory data revealed PCO2 of 102 with hypoxia.  Patient given 2 DuoNeb's, IV Solu-Medrol 125 mg, ceftriaxone 1 g IV and azithromycin 500 mg IV.  In addition given  patient's marketed lower extremity edema patient also given IV Lasix 40 mg patient discussed with Dr. Sheryle Hail for hospital admission further evaluation and management. ____________________________________________  FINAL CLINICAL IMPRESSION(S) / ED DIAGNOSES  Final diagnoses:  COPD exacerbation (HCC)  Community acquired pneumonia of right lower lobe of lung (HCC)     MEDICATIONS GIVEN DURING THIS VISIT:  Medications  azithromycin (ZITHROMAX) 500 mg in sodium chloride 0.9 % 250 mL IVPB (500 mg Intravenous New Bag/Given 02/24/18 0527)  furosemide (LASIX) injection 40 mg (40 mg Intravenous Given 02/24/18 0411)  cefTRIAXone (ROCEPHIN) 1 g in sodium chloride 0.9 % 100 mL IVPB (0 g Intravenous Stopped 02/24/18 0532)  ipratropium-albuterol (DUONEB) 0.5-2.5 (3) MG/3ML nebulizer solution 3 mL (3 mLs Nebulization Given 02/24/18 0521)  ipratropium-albuterol (DUONEB) 0.5-2.5 (3) MG/3ML nebulizer solution 3 mL (3 mLs Nebulization Given 02/24/18 0521)  methylPREDNISolone sodium succinate (SOLU-MEDROL) 125 mg/2 mL injection 125 mg (125 mg Intravenous Given 02/24/18 0523)     ED Discharge Orders    None       Note:  This document was prepared using Dragon voice recognition software and may include unintentional dictation errors.    Darci Current, MD 02/24/18 [phone]

## 2018-02-24 NOTE — ED Notes (Signed)
Dr Manson PasseyBrown made aware of pt's critical venous blood gas values as reported by lab at this time. pCO2 102 mmHg Bicarb 59.0 mmol/L

## 2018-02-24 NOTE — Progress Notes (Signed)
Pharmacy Antibiotic Note  Scott Richard is a 82 y.o. male admitted from home on 02/24/2018 with suspected healthcare associated pneumonia and COPD exacerbation. CXR shows no signs of pneumonia. Pharmacy has been consulted for vancomycin dosing. Patient received azithromycin 500 mg IV x 1 and ceftriaxone 1 g IV x 1 on 11/14.  Plan:  Per ICU morning round, will discontinue Vancomycin and ceftriaxone since patient's respiratory symptoms likely associated with CHF exacerbation.  Start azithroymycin 250 mg PO Qday for 4 days. Give first dose on 11/15 @ 1000.   Height: 5\' 5"  (165.1 cm) Weight: 158 lb 15.2 oz (72.1 kg) IBW/kg (Calculated) : 61.5  Temp (24hrs), Avg:97.2 F (36.2 C), Min:97.1 F (36.2 C), Max:97.3 F (36.3 C)  Recent Labs  Lab 02/24/18 0420 02/24/18 0421  WBC 7.5  --   CREATININE  --  0.45*    Estimated Creatinine Clearance: 64.1 mL/min (A) (by C-G formula based on SCr of 0.45 mg/dL (L)).    No Known Allergies  Antimicrobials this admission: 11/14 azithromycin >> 11/18 11/14 ceftriaxone x 1  11/14 Vancomycin >> 11/14    Dose adjustments this admission: 11/14 azithromycin 500 mg IV to 250 mg PO   Microbiology results: 11/14 sputum CRx >> pending 11/14 MRSA PCR >> negative   Thank you for allowing pharmacy to be a part of this patient's care.  Emmaline LifeYang Tanveer Dobberstein  Pharmacy student  02/24/2018 11:41 AM

## 2018-02-24 NOTE — ED Triage Notes (Signed)
Pt arrives to ED via [facility] from home with c/o sore throat and SHOB. EMS states pt with h/x of CIOPD and CHF, seen by PCP yesterday who increased his Lasix to 40mg  once daily. EMS reports pt with audible rales in all lung fields, with productive cough. Pt uses chronic home O2 at 3.5-4L/min. Dr Manson PasseyBrown at bedside upon pt's arrival to ED.

## 2018-02-24 NOTE — Consult Note (Signed)
Cardiology Consultation Note    Patient ID: Scott Richard, MRN: [id number], DOB/AGE: [birth year] 82 y.o. Admit date: 02/24/2018   Date of Consult: 02/24/2018 Primary Physician: Lyndon Code, MD Primary Cardiologist: Dr. Darrold Junker  Chief Complaint: sob Reason for Consultation: Scott Richard Requesting MD: Ms. Shirl Harris  HPI: Scott Richard is a 82 y.o. male with history of diastolic heart failure with known preserved LV function, history of permanent atrial fibrillation treated with rate control and anticoagulation with low-dose Eliquis 2.5 mg twice daily, history of COPD who was seen in his cardiologist office yesterday.  He was felt to be in his usual state of health.  Presented to the emergency room this morning complaining of shortness of breath.  He has chronic peripheral edema and is on 20 mg of furosemide daily.  His renal function has been fairly well preserved with a creatinine of 23 and a creatinine of 0.9.  On presentation to the emergency room he was hypoxic and hypercapnic.  He required BiPAP initially.  Chest x-ray revealed hyperexpanded lungs with a small left pleural effusion.  Elevation of the right hemidiaphragm.  Vascular crowding with no obvious pulmonary edema.  Left basilar airspace opacification.  It is difficult historian.  He has chronic peripheral edema, chronic atrial fibrillation, ports apparent compliance with medications.  Electrocardiogram today reveals fibrillation with controlled ventricular response.  There are no ischemic changes.  Echocardiogram done approximately 2 weeks ago was read as showing an ejection fraction of 60 to 65% with normal left atrial size.  Right ventricular systolic function was normal.  Patient did not have any reported regurgitation.  Past Medical History:  Diagnosis Date  . Alcohol use   . Arthritis   . Atrial fibrillation (HCC)   . CHF (congestive heart failure) (HCC)   . COPD (chronic obstructive pulmonary disease) (HCC)   . Coronary  artery disease   . Hyperlipidemia   . Hypertension   . Stroke [facility])       Surgical History:  Past Surgical History:  Procedure Laterality Date  . NECK SURGERY    . TONSILLECTOMY       Home Meds: Prior to Admission medications   Medication Sig Start Date End Date Taking? Authorizing Provider  acetaminophen (TYLENOL) 325 MG tablet Take 2 tablets (650 mg total) by mouth every 6 (six) hours as needed for mild pain or headache. 02/04/18  Yes Gouru, Deanna Artis, MD  apixaban (ELIQUIS) 2.5 MG TABS tablet Take 2.5 mg by mouth 2 (two) times daily.   Yes [provider]  carvedilol (COREG) 3.125 MG tablet Take 1 tablet (3.125 mg total) by mouth 2 (two) times daily with a meal. Patient taking differently: Take 3.125 mg by mouth daily.  02/04/18  Yes Gouru, Deanna Artis, MD  Cholecalciferol (VITAMIN D-1000 MAX ST) 1000 units tablet Take 1 tablet by mouth daily.   Yes [provider]  fluticasone (FLONASE) 50 MCG/ACT nasal spray Place 2 sprays into both nostrils daily. Patient taking differently: Place 2 sprays into both nostrils 2 (two) times daily.  01/17/18  Yes Boscia, Kathlynn Grate, NP  furosemide (LASIX) 20 MG tablet Take 1 tablet (20 mg total) by mouth daily. Patient taking differently: Take 40 mg by mouth daily.  02/04/18  Yes Gouru, Deanna Artis, MD  lisinopril (PRINIVIL,ZESTRIL) 2.5 MG tablet Take 1 tablet (2.5 mg total) by mouth daily. 02/05/18  Yes Gouru, Deanna Artis, MD  Multiple Vitamin (MULTIVITAMIN) capsule Take by mouth.   Yes [provider]  oxyCODONE (OXY IR/ROXICODONE)  5 MG immediate release tablet Take 1 tablet (5 mg total) by mouth every 6 (six) hours as needed for moderate pain. 06/22/16  Yes Enid Baas, MD  pramipexole (MIRAPEX) 0.5 MG tablet Take 1 tablet (0.5 mg total) by mouth 3 (three) times daily. 01/03/18  Yes Scarboro, Coralee North, NP  SYMBICORT 160-4.5 MCG/ACT inhaler Inhale 2 puffs into the lungs 2 (two) times daily. 11/10/17  Yes Lyndon Code, MD  VENTOLIN HFA 108 [phone]  Base) MCG/ACT inhaler Inhale 1-2 puffs into the lungs every 6 (six) hours as needed for wheezing or shortness of breath. 02/14/18  Yes Boscia, Kathlynn Grate, NP  cetirizine (ZYRTEC) 10 MG tablet Take 10 mg by mouth daily.    [provider]  guaiFENesin-dextromethorphan (ROBITUSSIN DM) 100-10 MG/5ML syrup Take 5 mLs by mouth every 4 (four) hours as needed for cough. Patient not taking: Reported on 02/24/2018 02/04/18   Ramonita Lab, MD  ipratropium-albuterol (DUONEB) 0.5-2.5 (3) MG/3ML SOLN Take 3 mLs by nebulization every 6 (six) hours as needed (wheezing). Patient not taking: Reported on 02/24/2018 06/22/16   Enid Baas, MD  lactose free nutrition (BOOST) LIQD Take 237 mLs by mouth 3 (three) times daily with meals.    [provider]  meloxicam (MOBIC) 7.5 MG tablet Take 1 tablet (7.5 mg total) by mouth 2 (two) times daily as needed. Patient not taking: Reported on 02/24/2018 02/14/18   Johnna Acosta, NP  OXYGEN Inhale 2 L into the lungs continuous.    [provider]    Inpatient Medications:  . apixaban  5 mg Oral BID  . [START ON 02/25/2018] azithromycin  250 mg Oral Daily  . carvedilol  3.125 mg Oral Daily  . chlorhexidine  15 mL Mouth Rinse BID  . cholecalciferol  1,000 Units Oral Daily  . docusate sodium  100 mg Oral BID  . fluticasone  2 spray Each Nare Daily  . fluticasone furoate-vilanterol  1 puff Inhalation Daily  . furosemide  40 mg Oral Daily  . insulin aspart  0-5 Units Subcutaneous QHS  . insulin aspart  0-9 Units Subcutaneous TID WC  . ipratropium-albuterol  3 mL Nebulization Q6H  . lactose free nutrition  237 mL Oral TID WC  . lisinopril  2.5 mg Oral Daily  . loratadine  10 mg Oral Daily  . mouth rinse  15 mL Mouth Rinse q12n4p  . pramipexole  0.5 mg Oral TID  . predniSONE  50 mg Oral Q breakfast   . vancomycin      Allergies: No Known Allergies  Social History   Socioeconomic History  . Marital status: Divorced    Spouse  name: Not on file  . Number of children: Not on file  . Years of education: Not on file  . Highest education level: Not on file  Occupational History  . Not on file  Social Needs  . Financial resource strain: Not on file  . Food insecurity:    Worry: Not on file    Inability: Not on file  . Transportation needs:    Medical: Not on file    Non-medical: Not on file  Tobacco Use  . Smoking status: Former Smoker    Last attempt to quit: 2012    Years since quitting: 7.8  . Smokeless tobacco: Never Used  Substance and Sexual Activity  . Alcohol use: No  . Drug use: No  . Sexual activity: Not on file  Lifestyle  . Physical activity:  Days per week: Not on file    Minutes per session: Not on file  . Stress: Not on file  Relationships  . Social connections:    Talks on phone: Not on file    Gets together: Not on file    Attends religious service: Not on file    Active member of club or organization: Not on file    Attends meetings of clubs or organizations: Not on file    Relationship status: Not on file  . Intimate partner violence:    Fear of current or ex partner: Not on file    Emotionally abused: Not on file    Physically abused: Not on file    Forced sexual activity: Not on file  Other Topics Concern  . Not on file  Social History Narrative  . Not on file     Family History  Problem Relation Age of Onset  . Prostate cancer Neg Hx   . Bladder Cancer Neg Hx   . Kidney cancer Neg Hx      Review of Systems: A 12-system review of systems was performed and is negative except as noted in the HPI.  Labs: No results for input(s): CKTOTAL, CKMB, TROPONINI in the last 72 hours. Lab Results  Component Value Date   WBC 7.5 02/24/2018   HGB 12.2 (L) 02/24/2018   HCT 40.2 02/24/2018   MCV 97.1 02/24/2018   PLT 173 02/24/2018    Recent Labs  Lab 02/24/18 0421  NA 135  K 4.3  CL 84*  CO2 46*  BUN 23  CREATININE 0.45*  CALCIUM 9.1  GLUCOSE 113*   No  results found for: CHOL, HDL, LDLCALC, TRIG No results found for: DDIMER  Radiology/Studies:  Dg Chest 2 View  Result Date: 02/01/2018 CLINICAL DATA:  Shortness of breath. EXAM: CHEST - 2 VIEW COMPARISON:  08/31/2016.  06/17/2016. FINDINGS: Mediastinum and hilar structures normal. Cardiomegaly with mild pulmonary venous congestion. Bibasilar atelectasis. Unchanged elevation right hemidiaphragm. Unchanged mild bowel distention. Prior cervicothoracic spine fusion. Hardware intact. IMPRESSION: 1.  Cardiomegaly with mild pulmonary venous congestion. 2. Persistent bibasilar atelectasis and elevation of the right hemidiaphragm. No interim change in appearance from prior exams. Electronically Signed   By: Maisie Fushomas  Register   On: 02/01/2018 13:43   Dg Chest Port 1 View  Result Date: 02/24/2018 CLINICAL DATA:  Acute onset of dyspnea. EXAM: PORTABLE CHEST 1 VIEW COMPARISON:  Chest radiograph performed 02/01/2018 FINDINGS: The lungs are hypoexpanded. A small left pleural effusion is noted. There is elevation of the right hemidiaphragm. Vascular crowding is noted. Left basilar airspace opacification could reflect pneumonia, depending on the patient's symptoms. There is no evidence of pneumothorax. The cardiomediastinal silhouette is within normal limits. No acute osseous abnormalities are seen. Cervical spinal fusion hardware is partially imaged. IMPRESSION: Lungs hypoexpanded. Small left pleural effusion noted. Left basilar airspace opacification could reflect pneumonia, depending on the patient's symptoms. Electronically Signed   By: Roanna RaiderJeffery  Chang M.D.   On: 02/24/2018 04:39    Wt Readings from Last 3 Encounters:  02/24/18 72.1 kg  02/11/18 76.7 kg  02/07/18 74.8 kg    EKG: Atrial fibrillation with variable but fairly well controlled ventricular response.  Physical Exam:  Blood pressure 121/72, pulse 72, temperature (!) 97.3 F (36.3 C), temperature source Axillary, resp. rate (!) 23, height 5\' 5"   (1.651 m), weight 72.1 kg, SpO2 90 %. Body mass index is 26.45 kg/m. General: Well developed, well nourished, in no acute distress. Head:  Normocephalic, atraumatic, sclera non-icteric, no xanthomas, nares are without discharge.  Neck: Negative for carotid bruits. JVD not elevated. Lungs: Bilateral rhonchi with crackles at the bases. Heart: Regular irregular rhythm with no murmurs, rubs, or gallops appreciated. Abdomen: Soft, non-tender, non-distended with normoactive bowel sounds. No hepatomegaly. No rebound/guarding. No obvious abdominal masses. Msk:  Strength and tone appear normal for age. Extremities: No clubbing or cyanosis.  4+ peripheral edema.  Not palpable due to edema.  Some excoriation on his heels. Neuro: Alert and oriented X 3. No facial asymmetry. No focal deficit. Moves all extremities spontaneously. Psych:  Responds to questions appropriately with a normal affect.     Assessment and Plan  82 year old male with history of chronic permanent atrial fibrillation treated with rate control and anticoagulation with with Eliquis, history of COPD, history of diastolic heart failure with no valvular heart disease and preserved LV function admitted with respiratory failure with hypercapnia and hypoxia.  He has been given IV Lasix.  Chest x-ray revealed a small left pleural effusion with some pulmonary vascular congestion but no high-grade pulmonary edema.  There was hyperexpansion of the lung fields.  He has been empirically placed on antibiotics.  He has been weaned from BiPAP to high flow oxygen.  He has been given IV Lasix.  Diastolic heart failure-possibly mildly volume overloaded however not profoundly.  We will continue with diuresis as you are doing following hemodynamics, renal function and urine output.  Would continue with lisinopril and carvedilol as you are doing.  Atrial fibrillation-rate is controlled with carvedilol.  We will continue with anticoagulation with Eliquis at 5 mg  twice daily.  Respiratory failure-continue to wean oxygen as you are doing.  Continue with bronchodilators and empiric antibiotics.  We will follow with you. Signed, Dalia Heading MD 02/24/2018, 12:27 PM Pager: [phone]

## 2018-02-24 NOTE — H&P (Signed)
Scott Richard is an 82 y.o. male.   Chief Complaint: Shortness of breath HPI: The patient with past medical history of COPD, CHF, hypertension status post stroke and coronary artery disease presents to the emergency department via EMS complaining of shortness of breath.  The patient states that "he thought he was going to die".  He denies chest pain and reports that he is breathing easier now that he has extra supplemental oxygen.  Oxygen saturations upon arrival were approximately 84% and remained so on 4 L of oxygen until he gradually increased to 91%.  Chest x-ray shows possible opacity in left lower lobe.  He received Solu-Medrol and multiple breathing treatments prior to the emergency department staff calling the hospitalist service for admission  Past Medical History:  Diagnosis Date  . Alcohol use   . Arthritis   . Atrial fibrillation ([place])   . CHF (congestive heart failure) ([place])   . COPD (chronic obstructive pulmonary disease) ([place])   . Coronary artery disease   . Hyperlipidemia   . Hypertension   . Stroke [facility])     Past Surgical History:  Procedure Laterality Date  . NECK SURGERY    . TONSILLECTOMY      Family History  Problem Relation Age of Onset  . Prostate cancer Neg Hx   . Bladder Cancer Neg Hx   . Kidney cancer Neg Hx    Social History:  reports that he quit smoking about 7 years ago. He has never used smokeless tobacco. He reports that he does not drink alcohol or use drugs.  Allergies: No Known Allergies  Prior to Admission medications   Medication Sig Start Date End Date Taking? Authorizing Provider  acetaminophen (TYLENOL) 325 MG tablet Take 2 tablets (650 mg total) by mouth every 6 (six) hours as needed for mild pain or headache. 02/04/18  Yes Gouru, Illene Silver, MD  apixaban (ELIQUIS) 2.5 MG TABS tablet Take 2.5 mg by mouth 2 (two) times daily.   Yes [provider]  carvedilol (COREG) 3.125 MG tablet Take 1 tablet (3.125 mg total) by mouth 2 (two)  times daily with a meal. Patient taking differently: Take 3.125 mg by mouth daily.  02/04/18  Yes Gouru, Illene Silver, MD  Cholecalciferol (VITAMIN D-1000 MAX ST) 1000 units tablet Take 1 tablet by mouth daily.   Yes [provider]  fluticasone (FLONASE) 50 MCG/ACT nasal spray Place 2 sprays into both nostrils daily. Patient taking differently: Place 2 sprays into both nostrils 2 (two) times daily.  01/17/18  Yes Boscia, Greer Ee, NP  furosemide (LASIX) 20 MG tablet Take 1 tablet (20 mg total) by mouth daily. Patient taking differently: Take 40 mg by mouth daily.  02/04/18  Yes Gouru, Illene Silver, MD  lisinopril (PRINIVIL,ZESTRIL) 2.5 MG tablet Take 1 tablet (2.5 mg total) by mouth daily. 02/05/18  Yes Gouru, Illene Silver, MD  Multiple Vitamin (MULTIVITAMIN) capsule Take by mouth.   Yes [provider]  oxyCODONE (OXY IR/ROXICODONE) 5 MG immediate release tablet Take 1 tablet (5 mg total) by mouth every 6 (six) hours as needed for moderate pain. 06/22/16  Yes Gladstone Lighter, MD  pramipexole (MIRAPEX) 0.5 MG tablet Take 1 tablet (0.5 mg total) by mouth 3 (three) times daily. 01/03/18  Yes Scarboro, Audie Clear, NP  SYMBICORT 160-4.5 MCG/ACT inhaler Inhale 2 puffs into the lungs 2 (two) times daily. 11/10/17  Yes Lavera Guise, MD  VENTOLIN HFA 108 [phone] Base) MCG/ACT inhaler Inhale 1-2 puffs into the lungs every 6 (six)  hours as needed for wheezing or shortness of breath. 02/14/18  Yes Boscia, Greer Ee, NP  cetirizine (ZYRTEC) 10 MG tablet Take 10 mg by mouth daily.    [provider]  guaiFENesin-dextromethorphan (ROBITUSSIN DM) 100-10 MG/5ML syrup Take 5 mLs by mouth every 4 (four) hours as needed for cough. Patient not taking: Reported on 02/24/2018 02/04/18   Nicholes Mango, MD  ipratropium-albuterol (DUONEB) 0.5-2.5 (3) MG/3ML SOLN Take 3 mLs by nebulization every 6 (six) hours as needed (wheezing). Patient not taking: Reported on 02/24/2018 06/22/16   Gladstone Lighter, MD  lactose free  nutrition (BOOST) LIQD Take 237 mLs by mouth 3 (three) times daily with meals.    [provider]  meloxicam (MOBIC) 7.5 MG tablet Take 1 tablet (7.5 mg total) by mouth 2 (two) times daily as needed. Patient not taking: Reported on 02/24/2018 02/14/18   Kendell Bane, NP  OXYGEN Inhale 2 L into the lungs continuous.    [provider]     Results for orders placed or performed during the hospital encounter of 02/24/18 (from the past 48 hour(s))  CBC with Differential     Status: Abnormal   Collection Time: 02/24/18  4:20 AM  Result Value Ref Range   WBC 7.5 4.0 - 10.5 K/uL   RBC 4.14 (L) 4.22 - 5.81 MIL/uL   Hemoglobin 12.2 (L) 13.0 - 17.0 g/dL   HCT 40.2 39.0 - 52.0 %   MCV 97.1 80.0 - 100.0 fL   MCH 29.5 26.0 - 34.0 pg   MCHC 30.3 30.0 - 36.0 g/dL   RDW 13.2 11.5 - 15.5 %   Platelets 173 150 - 400 K/uL   nRBC 0.0 0.0 - 0.2 %   Neutrophils Relative % 81 %   Neutro Abs 6.0 1.7 - 7.7 K/uL   Lymphocytes Relative 6 %   Lymphs Abs 0.5 (L) 0.7 - 4.0 K/uL   Monocytes Relative 12 %   Monocytes Absolute 0.9 0.1 - 1.0 K/uL   Eosinophils Relative 1 %   Eosinophils Absolute 0.1 0.0 - 0.5 K/uL   Basophils Relative 0 %   Basophils Absolute 0.0 0.0 - 0.1 K/uL   Immature Granulocytes 0 %   Abs Immature Granulocytes 0.03 0.00 - 0.07 K/uL    Comment: Performed at [facility], [address] [place], [place] [address]  Brain natriuretic peptide     Status: Abnormal   Collection Time: 02/24/18  4:20 AM  Result Value Ref Range   B Natriuretic Peptide 216.0 (H) 0.0 - 100.0 pg/mL    Comment: Performed at [facility], [address] [place], [place] [address]  Basic metabolic panel     Status: Abnormal   Collection Time: 02/24/18  4:21 AM  Result Value Ref Range   Sodium 135 135 - 145 mmol/L   Potassium 4.3 3.5 - 5.1 mmol/L   Chloride 84 (L) 98 - 111 mmol/L   CO2 46 (H) 22 - 32 mmol/L   Glucose, Bld 113 (H) 70 - 99 mg/dL   BUN 23 8 - 23 mg/dL    Creatinine, Ser 0.45 (L) 0.61 - 1.24 mg/dL   Calcium 9.1 8.9 - 10.3 mg/dL   GFR calc non Af Amer >60 >60 mL/min   GFR calc Af Amer >60 >60 mL/min    Comment: (NOTE) The eGFR has been calculated using the CKD EPI equation. This calculation has not been validated in all clinical situations. eGFR's persistently <60 mL/min signify possible Chronic Kidney  Disease.    Anion gap 5 5 - 15    Comment: Performed at [facility], [address] [place], [place] [address]  Blood gas, venous     Status: Abnormal (Preliminary result)   Collection Time: 02/24/18  5:17 AM  Result Value Ref Range   pH, Ven 7.37 7.250 - 7.430   pCO2, Ven 102 (HH) 44.0 - 60.0 mmHg    Comment: CRITICAL RESULT CALLED TO, READ BACK BY AND VERIFIED WITH:  BUTCH, RN AT 0529 ON [id number]    pO2, Ven PENDING 32.0 - 45.0 mmHg   Bicarbonate 59.0 (H) 20.0 - 28.0 mmol/L   Acid-Base Excess 27.3 (H) 0.0 - 2.0 mmol/L   O2 Saturation PENDING %   Patient temperature 37.0    Sample type VENOUS     Comment: Performed at [facility], [address], [place], [place] [address]   Dg Chest Port 1 View  Result Date: 02/24/2018 CLINICAL DATA:  Acute onset of dyspnea. EXAM: PORTABLE CHEST 1 VIEW COMPARISON:  Chest radiograph performed 02/01/2018 FINDINGS: The lungs are hypoexpanded. A small left pleural effusion is noted. There is elevation of the right hemidiaphragm. Vascular crowding is noted. Left basilar airspace opacification could reflect pneumonia, depending on the patient's symptoms. There is no evidence of pneumothorax. The cardiomediastinal silhouette is within normal limits. No acute osseous abnormalities are seen. Cervical spinal fusion hardware is partially imaged. IMPRESSION: Lungs hypoexpanded. Small left pleural effusion noted. Left basilar airspace opacification could reflect pneumonia, depending on the patient's symptoms. Electronically Signed   By: Garald Balding M.D.   On: 02/24/2018 04:39    Review of  Systems  Constitutional: Negative for chills and fever.  HENT: Negative for sore throat and tinnitus.   Eyes: Negative for blurred vision and redness.  Respiratory: Positive for cough and shortness of breath.   Cardiovascular: Positive for leg swelling. Negative for chest pain, palpitations, orthopnea and PND.  Gastrointestinal: Negative for abdominal pain, diarrhea, nausea and vomiting.  Genitourinary: Negative for dysuria, frequency and urgency.  Musculoskeletal: Negative for joint pain and myalgias.  Skin: Negative for rash.       No lesions  Neurological: Negative for speech change, focal weakness and weakness.  Endo/Heme/Allergies: Does not bruise/bleed easily.       No temperature intolerance  Psychiatric/Behavioral: Negative for depression and suicidal ideas.    Blood pressure 130/69, pulse 69, temperature (!) 97.1 F (36.2 C), temperature source Oral, resp. rate (!) 22, height '5\' 6"'$  (1.676 m), weight 77 kg, SpO2 (!) 84 %. Physical Exam  Vitals reviewed. Constitutional: He is oriented to person, place, and time. He appears well-developed and well-nourished. No distress.  HENT:  Head: Normocephalic and atraumatic.  Mouth/Throat: Oropharynx is clear and moist.  Eyes: Pupils are equal, round, and reactive to light. Conjunctivae and EOM are normal. No scleral icterus.  Neck: Normal range of motion. Neck supple. No JVD present. No tracheal deviation present. No thyromegaly present.  Cardiovascular: Normal rate, regular rhythm and normal heart sounds. Exam reveals no gallop and no friction rub.  No murmur heard. Respiratory: Breath sounds normal. He is in respiratory distress.  GI: Soft. Bowel sounds are normal. He exhibits no distension. There is no tenderness.  Genitourinary:  Genitourinary Comments: Deferred  Musculoskeletal: Normal range of motion. He exhibits edema (2+).  Lymphadenopathy:    He has no cervical adenopathy.  Neurological: He is alert and oriented to person,  place, and time. No cranial nerve deficit.  Skin: Skin  is warm and dry. No rash noted. No erythema.  Psychiatric: He has a normal mood and affect. His behavior is normal. Judgment and thought content normal.     Assessment/Plan This is an 82 year old male admitted for ammonia. 1.  Pneumonia: Healthcare associated; continue ceftriaxone, azithromycin and vancomycin.  The patient had improved significantly on supplemental oxygen via nasal cannula but he began to decompensate in the emergency department.  We placed him on BiPAP with good improvement in his oxygen saturations and work of breathing.  The patient appears very comfortable at this time.  Wean supplemental O2 as possible to maintain oxygen saturations 88 to 92%. 2.  COPD: With exacerbation; CO2 greater than 100 but the patient is mentating well.  He chronically retains carbon dioxide.  Continue azithromycin for anti-inflammatory effects.  The patient received methylprednisone in the emergency department and will need a long steroid taper.  Continue inhaled corticosteroid.  Albuterol as needed. 3.  Hypertension: Controlled; continue lisinopril and carvedilol 4.  Atrial fibrillation: Rate controlled; continue Eliquis 5.  CHF: Carries this diagnosis but last EF was normal and diastolic function was normal as well.  Continue Lasix per home regimen.  6.  DVT prophylaxis: Therapeutic anticoagulation 7.  GI prophylaxis: None The patient is a full code.  Time spent on admission orders and critical care approximately 45 minutes  Harrie Foreman, MD 02/24/2018, 7:17 AM

## 2018-02-24 NOTE — ED Notes (Signed)
Pt o2 sats decreased to 83% , pt repositioned, up to 85-88%. MD Manson PasseyBrown made aware, RT called for bipap placement

## 2018-02-24 NOTE — Progress Notes (Signed)
Pharmacy Electrolytes Consult note  Pharmacy is consulted for electrolytes replacement for this 82 YOM admitted on 11/14 from home for healthcare associated pneumonia.   Most recent labs Na+ 135 K+ 4.3 Cl- 84 Ca2+ 9.1   Will replace with goal of potassium ~4 and magnesium ~2.  Patient received furosemide 40 mg IV x 1 on 11/14 @ 0415. Patient scheduled furosemide 40 mg tab PO Qday. No electrolytes replacement warranted at this time. Will order electrolytes with AM lab.   Thank you for allowing pharmacy to participate in this patient's care.   Scott Richard 02/24/2018,11:57 AM

## 2018-02-25 ENCOUNTER — Inpatient Hospital Stay: Payer: Medicare HMO

## 2018-02-25 ENCOUNTER — Other Ambulatory Visit: Payer: Self-pay

## 2018-02-25 LAB — COMPREHENSIVE METABOLIC PANEL
ALBUMIN: 3 g/dL — AB (ref 3.5–5.0)
ALT: 18 U/L (ref 0–44)
AST: 19 U/L (ref 15–41)
Alkaline Phosphatase: 41 U/L (ref 38–126)
Anion gap: 15 (ref 5–15)
BILIRUBIN TOTAL: 0.9 mg/dL (ref 0.3–1.2)
BUN: 23 mg/dL (ref 8–23)
CHLORIDE: 83 mmol/L — AB (ref 98–111)
CO2: 38 mmol/L — AB (ref 22–32)
Calcium: 8.8 mg/dL — ABNORMAL LOW (ref 8.9–10.3)
Creatinine, Ser: 0.65 mg/dL (ref 0.61–1.24)
GFR calc Af Amer: >60 mL/min (ref >60)
GFR calc non Af Amer: >60 mL/min (ref >60)
GLUCOSE: 135 mg/dL — AB (ref 70–99)
Potassium: 3.6 mmol/L (ref 3.5–5.1)
SODIUM: 136 mmol/L (ref 135–145)
Total Protein: 6 g/dL — ABNORMAL LOW (ref 6.5–8.1)

## 2018-02-25 LAB — CBC WITH DIFFERENTIAL/PLATELET
Abs Immature Granulocytes: 0.03 10*3/uL (ref 0.00–0.07)
BASOS ABS: 0 10*3/uL (ref 0.0–0.1)
Basophils Relative: 0 %
Eosinophils Absolute: 0 10*3/uL (ref 0.0–0.5)
Eosinophils Relative: 0 %
HEMATOCRIT: 36.2 % — AB (ref 39.0–52.0)
HEMOGLOBIN: 11.3 g/dL — AB (ref 13.0–17.0)
Immature Granulocytes: 0 %
LYMPHS ABS: 0.4 10*3/uL — AB (ref 0.7–4.0)
LYMPHS PCT: 5 %
MCH: 29.5 pg (ref 26.0–34.0)
MCHC: 31.2 g/dL (ref 30.0–36.0)
MCV: 94.5 fL (ref 80.0–100.0)
MONO ABS: 0.4 10*3/uL (ref 0.1–1.0)
Monocytes Relative: 6 %
NRBC: 0 % (ref 0.0–0.2)
Neutro Abs: 6.3 10*3/uL (ref 1.7–7.7)
Neutrophils Relative %: 89 %
Platelets: 190 10*3/uL (ref 150–400)
RBC: 3.83 MIL/uL — ABNORMAL LOW (ref 4.22–5.81)
RDW: 13.4 % (ref 11.5–15.5)
WBC: 7.1 10*3/uL (ref 4.0–10.5)

## 2018-02-25 LAB — GLUCOSE, CAPILLARY
Glucose-Capillary: 120 mg/dL — ABNORMAL HIGH (ref 70–99)
Glucose-Capillary: 133 mg/dL — ABNORMAL HIGH (ref 70–99)
Glucose-Capillary: 144 mg/dL — ABNORMAL HIGH (ref 70–99)
Glucose-Capillary: 114 mg/dL — ABNORMAL HIGH (ref 70–99)

## 2018-02-25 LAB — BRAIN NATRIURETIC PEPTIDE: B NATRIURETIC PEPTIDE 5: 209 pg/mL — AB (ref 0.0–100.0)

## 2018-02-25 LAB — TROPONIN I: Troponin I: <0.03 ng/mL (ref <0.03)

## 2018-02-25 LAB — MAGNESIUM: Magnesium: 1.6 mg/dL — ABNORMAL LOW (ref 1.7–2.4)

## 2018-02-25 MED ORDER — ACETAZOLAMIDE SODIUM 500 MG IJ SOLR
250.0000 mg | Freq: Once | INTRAMUSCULAR | Status: AC
  Administered 2018-02-25: 250 mg via INTRAVENOUS
  Filled 2018-02-25: qty 250

## 2018-02-25 MED ORDER — MAGNESIUM SULFATE 4 GM/100ML IV SOLN
4.0000 g | Freq: Once | INTRAVENOUS | Status: AC
  Administered 2018-02-25: 4 g via INTRAVENOUS
  Filled 2018-02-25: qty 100

## 2018-02-25 MED ORDER — POTASSIUM CHLORIDE CRYS ER 20 MEQ PO TBCR
40.0000 meq | EXTENDED_RELEASE_TABLET | Freq: Once | ORAL | Status: AC
  Administered 2018-02-25: 40 meq via ORAL
  Filled 2018-02-25: qty 2

## 2018-02-25 MED ORDER — SODIUM CHLORIDE 0.9 % IV SOLN
1.0000 g | INTRAVENOUS | Status: DC
  Administered 2018-02-25 – 2018-02-26 (×2): 1 g via INTRAVENOUS
  Filled 2018-02-25: qty 10
  Filled 2018-02-25 (×2): qty 1

## 2018-02-25 MED ORDER — MEDROXYPROGESTERONE ACETATE 10 MG PO TABS: 10.0000 mg | ORAL_TABLET | Freq: Every day | ORAL | Status: DC

## 2018-02-25 NOTE — Progress Notes (Signed)
Chaplain responded to an OR for an AD. Pt is not capable to fill out forms. Chaplain educated family. Concern was will this form require HCPOA to care for Pt. Chaplain answered that it only allows them to speak for the Pt. Chaplain offered and executed prayer.    02/25/18 1400  Clinical Encounter Type  Visited With Patient and family together  Visit Type Spiritual support  Referral From Chaplain  Spiritual Encounters  Spiritual Needs Brochure;Prayer

## 2018-02-25 NOTE — Progress Notes (Signed)
-  Patient's ABG was repeated on nasal cannula 1 L which showed pH of 7.45 PCO2 70 PO2 44 -Oxygen saturation on monitor is 91% -Overall patient is doing very well with improvement in CO2 and mentation - Increase oxygen to 2 L -Okay to transfer the patient out of ICU  Armeda Plumb [facility]

## 2018-02-25 NOTE — Progress Notes (Signed)
Pt refused bipap at this time

## 2018-02-25 NOTE — Progress Notes (Signed)
Name: Scott Richard MRN: [id number] DOB: 11/16/1935    ADMISSION DATE:  02/24/2018 CONSULTATION DATE: 02/24/2018  REFERRING MD : Dr. Sheryle Hail   CHIEF COMPLAINT: Shortness of Breath   BRIEF PATIENT DESCRIPTION: 82 yo male admitted with acute on chronic hypoxic hypercapnic respiratory failure secondary to acute CHF exacerbation and AECOPD requiring Bipap  SIGNIFICANT EVENTS  11/14-Pt admitted to the stepdown unit on Bipap   STUDIES:  None   HISTORY OF PRESENT ILLNESS:   This is an 82 yo male with a PMH of CVA, HTN, Hyperlipidemia, CAD, COPD, Home O2 @ 3.5-4L via nasal canula, Atrial Fibrillation (on eliquis), Arthritis, ETOH Abuse, and Chronic Diastolic CHF.  He presented to [facility] ER on 11/14 via EMS from home with c/o productive cough and shortness of breath.  Per ER notes the pt saw his Cardiologist Dr. Darrold Junker on 11/13 due to symptoms, and his lasix dose was increased to 40 mg daily, however symptoms worsened prompting ER visit.  Upon arrival to the ER pts O2 sats on 4L via nasal canula was 84%, therefore he was transitioned to Bipap.  CXR showed small left pleural effusion and possible pneumonia.  Lab results revealed BNP 216, PCT <0.10, and vbg pH 7.37/pCO2 102.  He was subsequently admitted to the stepdown unit for additional workup and treatment.   02/25/2018: -Patient is more awake, denies any significant complaint, -500 -Received Lasix - Potassium and magnesium replaced -No fever, PCO2 is still 88 but pH is more than 7.35 suggest patient has chronic CO2 retention   VITAL SIGNS: Temp:  [97.9 F (36.6 C)-98.5 F (36.9 C)] 98 F (36.7 C) (11/15 0752) Pulse Rate:  [30-80] 53 (11/15 0900) Resp:  [17-27] 20 (11/15 0900) BP: (90-177)/(56-151) 108/56 (11/15 0900) SpO2:  [86 %-100 %] 94 % (11/15 0900) FiO2 (%):  [24 %-32.5 %] 24 % (11/15 0849) Weight:  [72.3 kg] 72.3 kg (11/15 0424)  PHYSICAL EXAMINATION: General: well developed, well nourished male, NAD  Neuro: alert  and oriented, follows commands HEENT: supple, no JVD Cardiovascular: irregular irregular, no R/G Lungs: diffuse crackles with faint expiratory wheezes throughout, even, non labored  Abdomen: +BS x4, soft, non tender, non distended  Musculoskeletal: normal bulk and tone, 2+ bilateral lower extremity edema  Skin: intact no rashes or lesions   Recent Labs  Lab 02/24/18 0421 02/25/18 0221  NA 135 136  K 4.3 3.6  CL 84* 83*  CO2 46* 38*  BUN 23 23  CREATININE 0.45* 0.65  GLUCOSE 113* 135*   Recent Labs  Lab 02/24/18 0420 02/25/18 0221  HGB 12.2* 11.3*  HCT 40.2 36.2*  WBC 7.5 7.1  PLT 173 190   US Venous Img Lower Bilateral  Result Date: 02/24/2018 CLINICAL DATA:  Bilateral lower extremity edema.  Evaluate for DVT. EXAM: BILATERAL LOWER EXTREMITY VENOUS DOPPLER ULTRASOUND TECHNIQUE: Gray-scale sonography with graded compression, as well as color Doppler and duplex ultrasound were performed to evaluate the lower extremity deep venous systems from the level of the common femoral vein and including the common femoral, femoral, profunda femoral, popliteal and calf veins including the posterior tibial, peroneal and gastrocnemius veins when visible. The superficial great saphenous vein was also interrogated. Spectral Doppler was utilized to evaluate flow at rest and with distal augmentation maneuvers in the common femoral, femoral and popliteal veins. COMPARISON:  None. FINDINGS: RIGHT LOWER EXTREMITY Common Femoral Vein: No evidence of thrombus. Normal compressibility, respiratory phasicity and response to augmentation. Saphenofemoral Junction: No evidence of thrombus. Normal compressibility  and flow on color Doppler imaging. Profunda Femoral Vein: No evidence of thrombus. Normal compressibility and flow on color Doppler imaging. Femoral Vein: No evidence of thrombus. Normal compressibility, respiratory phasicity and response to augmentation. Popliteal Vein: No evidence of thrombus. Normal  compressibility, respiratory phasicity and response to augmentation. Calf Veins: No evidence of thrombus. Normal compressibility and flow on color Doppler imaging. Superficial Great Saphenous Vein: No evidence of thrombus. Normal compressibility. Venous Reflux:  None. Other Findings:  None. LEFT LOWER EXTREMITY Common Femoral Vein: No evidence of thrombus. Normal compressibility, respiratory phasicity and response to augmentation. Saphenofemoral Junction: No evidence of thrombus. Normal compressibility and flow on color Doppler imaging. Profunda Femoral Vein: No evidence of thrombus. Normal compressibility and flow on color Doppler imaging. Femoral Vein: No evidence of thrombus. Normal compressibility, respiratory phasicity and response to augmentation. Popliteal Vein: No evidence of thrombus. Normal compressibility, respiratory phasicity and response to augmentation. Calf Veins: No evidence of thrombus. Normal compressibility and flow on color Doppler imaging. Superficial Great Saphenous Vein: No evidence of thrombus. Normal compressibility. Venous Reflux:  None. Other Findings:  None. IMPRESSION: No evidence of DVT within either lower extremity. Electronically Signed   By: Simonne Come M.D.   On: 02/24/2018 16:34   Dg Chest Port 1 View  Result Date: 02/25/2018 CLINICAL DATA:  Hx of COPD, CHF, CAD. Former smoker 2012 Now with increasing SOB, pt on CPAP EXAM: PORTABLE CHEST 1 VIEW COMPARISON:  02/24/2018 FINDINGS: There is stable elevation of the RIGHT hemidiaphragm. Patient is rotated towards the LEFT. The heart size is probably normal. There is persistent opacity in the LEFT lung base partially obscuring the hemidiaphragm. No pulmonary edema. Remote rib fractures. IMPRESSION: Persistent LEFT LOWER lobe atelectasis or infiltrate. Electronically Signed   By: Norva Pavlov M.D.   On: 02/25/2018 09:41   Dg Chest Port 1 View  Result Date: 02/24/2018 CLINICAL DATA:  Acute onset of dyspnea. EXAM: PORTABLE  CHEST 1 VIEW COMPARISON:  Chest radiograph performed 02/01/2018 FINDINGS: The lungs are hypoexpanded. A small left pleural effusion is noted. There is elevation of the right hemidiaphragm. Vascular crowding is noted. Left basilar airspace opacification could reflect pneumonia, depending on the patient's symptoms. There is no evidence of pneumothorax. The cardiomediastinal silhouette is within normal limits. No acute osseous abnormalities are seen. Cervical spinal fusion hardware is partially imaged. IMPRESSION: Lungs hypoexpanded. Small left pleural effusion noted. Left basilar airspace opacification could reflect pneumonia, depending on the patient's symptoms. Electronically Signed   By: Roanna Raider M.D.   On: 02/24/2018 04:39    ASSESSMENT / PLAN:  Acute on chronic hypoxic hypercapnic respiratory failure secondary to Acute CHF exacerbation and AECOPD  CXR reviewed no signs of pneumonia  Prn Bipap for dyspnea and/or hypoxia  Scheduled and prn bronchodilator therapy Was on Solu-Medrol will cut the dose down to daily and switch to prednisone from tomorrow Repeat ABG pending Influenza is negative -Cardiology consult appreciated -Echo 2-week ago did not reveal significant pulmonary hypertension -However clinical examination suggest cor pulmonale -Lasix 20 every 12 -Diamox 1 dose to readjust the pH and PCO2 -continue Provera 10 mg daily -Treat for COPD exacerbation with Solu-Medrol - DVT study done which is negative -Spoke with patient's family at length about CODE STATUS patient is DNR Trend WBC and monitor fever curve Trend PCT  Obtain respiratory culture  Azithromycin x3 doses   Acute on chronic diastolic CHF exacerbation Chronic atrial fibrillation HTN Hx: Hyperlipidemia, CAD, and CVA Continuous telemetry monitoring  Continue lisinopril, carvedilol,  and lasix Cardiologist consulted appreciate input  Continue with Coreg and Eliquis  Hyperglycemia CBG's ac/hs SSI   -Bilateral  leg edema: Most likely due to cor pulmonale monitor with Lasix, DVT studies negative  -No real evidence of pneumonia, will do a sputum culture blood cultures but will hold off on other aggressive antibiotics and just continue with Zithromax    Skin/Wound: Blister on both skin  Electrolytes: Replace electrolytes per ICU electrolyte replacement protocol.   IVF: none  Nutrition: Diet as tolerated  Prophylaxis: DVT Prophylaxis with Eliquis,. GI Prophylaxis.   Restraints: None  PT/OT eval and treat. OOB when appropriate.   Lines/Tubes: No Foley no central line.  ADVANCE DIRECTIVE: DNR status  FAMILY DISCUSSION: Spoke with family at length  Quality Care: PPI, DVT prophylaxis, HOB elevated, Infection control all reviewed and addressed.  Events and notes from last 24 hours reviewed. Care plan discussed on multidisciplinary rounds  CC TIME: 32 minutes   Old records reviewed discussed results and management plan with patient  Images personally reviewed and results and labs reviewed and discussed with patient.  All medication reviewed and adjusted  Further management depending on test results and work up as outlined above.    -Follow blood gas if doing well patient looks like even with chronic CO2 more than 80 is back to baseline and awake and talking can be transferred out of ICU and use BiPAP at nighttime.

## 2018-02-25 NOTE — Progress Notes (Signed)
Pharmacy Electrolytes Consult note  Pharmacy is consulted for electrolytes replacement for this 82 YOM admitted on 11/14 from home for healthcare associated pneumonia.   Most recent labs Na+ 136 K+ 3.6 Cl- 83 Ca2+ 8.8 Albumin 3.0 Magnesium 1.6  Corrected calcium 9.6  Will replace with goal of potassium ~4 and magnesium ~2.  Patient received furosemide 40 mg PO x 1 and furosemide 20 mg IV x 2. Last dose of IV furosemide given on 11/15 @ 0418. Patient scheduled furosemide 20 mg tab IV Q12h. Patient received magnesium sulfate 4 g IV x 1 and  KLOR-CON 40 mEq PO x 1 on 11/15 @ 0900. Will order electrolytes with AM lab.   Thank you for allowing pharmacy to participate in this patient's care.   Emmaline LifeYang Demeka Sutter 02/25/2018,11:04 AM

## 2018-02-25 NOTE — Progress Notes (Signed)
PT Cancellation Note  Patient Details Name: Scott EasterlyWilbur G Siwek MRN: [id number] DOB: 1935/07/05   Cancelled Treatment:    Reason Eval/Treat Not Completed: Medical issues which prohibited therapy.  Elevated CO2 values that are critical for 3 days.  Will recheck tomorrow to see if pt is medically more stable.   Ivar DrapeRuth E Lanore Renderos 02/25/2018, 4:15 PM   Samul Dadauth Jacky Hartung, PT MS Acute Rehab Dept. Number: [facility] [id number] and [facility] [phone]

## 2018-02-25 NOTE — Progress Notes (Signed)
Patient ID: Scott Richard, male   DOB: 1935-07-11, 82 y.o.   MRN: [id number]  [employer] PROGRESS NOTE  Scott EasterlyWilbur G Scott Richard NWG:[id number] DOB: 1935-07-11 DOA: 02/24/2018 PCP: Lyndon CodeKhan, Fozia M, MD  HPI/Subjective: Patient feeling a little bit better.  Just transferred out of the ICU today.  Still with some cough and some shortness of breath.  Some swelling in his scrotum.  Still with some swelling on his legs.  Feet looking better.  Objective: Vitals:   02/25/18 0849 02/25/18 0900  BP:  (!) 108/56  Pulse: (!) 56 (!) 53  Resp: (!) 21 20  Temp:    SpO2: 96% 94%    Intake/Output Summary (Last 24 hours) at 02/25/2018 1422 Last data filed at 02/25/2018 0528 Gross per 24 hour  Intake -  Output 700 ml  Net -700 ml   Filed Weights   02/24/18 0413 02/24/18 0833 02/25/18 0424  Weight: 77 kg 72.1 kg 72.3 kg    ROS: Review of Systems  Constitutional: Negative for chills and fever.  Eyes: Negative for blurred vision.  Respiratory: Positive for cough and shortness of breath.   Cardiovascular: Negative for chest pain.  Gastrointestinal: Positive for abdominal pain. Negative for constipation, diarrhea, nausea and vomiting.  Genitourinary: Negative for dysuria.  Musculoskeletal: Positive for joint pain.  Neurological: Negative for dizziness and headaches.   Exam: Physical Exam  HENT:  Nose: No mucosal edema.  Mouth/Throat: No oropharyngeal exudate or posterior oropharyngeal edema.  Eyes: Pupils are equal, round, and reactive to light. Conjunctivae, EOM and lids are normal.  Neck: No JVD present. Carotid bruit is not present. No edema present. No thyroid mass and no thyromegaly present.  Cardiovascular: S1 normal and S2 normal. Exam reveals no gallop.  No murmur heard. Pulses:      Dorsalis pedis pulses are 2+ on the right side, and 2+ on the left side.  Respiratory: No respiratory distress. He has decreased breath sounds in the right lower field and the left lower field. He has  wheezes in the right middle field and the left middle field. He has rhonchi in the right lower field and the left lower field. He has no rales.  GI: Soft. Bowel sounds are normal. There is no tenderness.  Genitourinary:  Genitourinary Comments: 4+ scrotal swelling  Musculoskeletal:       Right ankle: He exhibits swelling.       Left ankle: He exhibits swelling.  Lymphadenopathy:    He has no cervical adenopathy.  Neurological: He is alert. No cranial nerve deficit.  Skin: Skin is warm. Nails show no clubbing.  The scaling on his feet have now gone down.  Just has a little red rim surrounding his left foot more on the lateral side.  Some blistering on his left lower leg.  Psychiatric: He has a normal mood and affect.      Data Reviewed: Basic Metabolic Panel: Recent Labs  Lab 02/24/18 0421 02/25/18 0221  NA 135 136  K 4.3 3.6  CL 84* 83*  CO2 46* 38*  GLUCOSE 113* 135*  BUN 23 23  CREATININE 0.45* 0.65  CALCIUM 9.1 8.8*  MG  --  1.6*   Liver Function Tests: Recent Labs  Lab 02/25/18 0221  AST 19  ALT 18  ALKPHOS 41  BILITOT 0.9  PROT 6.0*  ALBUMIN 3.0*   CBC: Recent Labs  Lab 02/24/18 0420 02/25/18 0221  WBC 7.5 7.1  NEUTROABS 6.0 6.3  HGB 12.2* 11.3*  HCT 40.2  36.2*  MCV 97.1 94.5  PLT 173 190   Cardiac Enzymes: Recent Labs  Lab 02/24/18 0420 02/24/18 1513 02/24/18 2011 02/25/18 0221  TROPONINI <0.03 0.03* 0.03* <0.03   BNP (last 3 results) Recent Labs    02/24/18 0420 02/25/18 0225  BNP 216.0* 209.0*     CBG: Recent Labs  Lab 02/24/18 1208 02/24/18 1616 02/24/18 2132 02/25/18 0745 02/25/18 1151  GLUCAP 155* 161* 129* 120* 133*    Recent Results (from the past 240 hour(s))  MRSA PCR Screening     Status: None   Collection Time: 02/24/18  8:36 AM  Result Value Ref Range Status   MRSA by PCR NEGATIVE NEGATIVE Final    Comment:        The GeneXpert MRSA Assay (FDA approved for NASAL specimens only), is one component of  a comprehensive MRSA colonization surveillance program. It is not intended to diagnose MRSA infection nor to guide or monitor treatment for MRSA infections. Performed at [facility], [address], [place], [place] [address]      Studies: US Venous Img Lower Bilateral  Result Date: 02/24/2018 CLINICAL DATA:  Bilateral lower extremity edema.  Evaluate for DVT. EXAM: BILATERAL LOWER EXTREMITY VENOUS DOPPLER ULTRASOUND TECHNIQUE: Gray-scale sonography with graded compression, as well as color Doppler and duplex ultrasound were performed to evaluate the lower extremity deep venous systems from the level of the common femoral vein and including the common femoral, femoral, profunda femoral, popliteal and calf veins including the posterior tibial, peroneal and gastrocnemius veins when visible. The superficial great saphenous vein was also interrogated. Spectral Doppler was utilized to evaluate flow at rest and with distal augmentation maneuvers in the common femoral, femoral and popliteal veins. COMPARISON:  None. FINDINGS: RIGHT LOWER EXTREMITY Common Femoral Vein: No evidence of thrombus. Normal compressibility, respiratory phasicity and response to augmentation. Saphenofemoral Junction: No evidence of thrombus. Normal compressibility and flow on color Doppler imaging. Profunda Femoral Vein: No evidence of thrombus. Normal compressibility and flow on color Doppler imaging. Femoral Vein: No evidence of thrombus. Normal compressibility, respiratory phasicity and response to augmentation. Popliteal Vein: No evidence of thrombus. Normal compressibility, respiratory phasicity and response to augmentation. Calf Veins: No evidence of thrombus. Normal compressibility and flow on color Doppler imaging. Superficial Great Saphenous Vein: No evidence of thrombus. Normal compressibility. Venous Reflux:  None. Other Findings:  None. LEFT LOWER EXTREMITY Common Femoral Vein: No evidence of thrombus. Normal  compressibility, respiratory phasicity and response to augmentation. Saphenofemoral Junction: No evidence of thrombus. Normal compressibility and flow on color Doppler imaging. Profunda Femoral Vein: No evidence of thrombus. Normal compressibility and flow on color Doppler imaging. Femoral Vein: No evidence of thrombus. Normal compressibility, respiratory phasicity and response to augmentation. Popliteal Vein: No evidence of thrombus. Normal compressibility, respiratory phasicity and response to augmentation. Calf Veins: No evidence of thrombus. Normal compressibility and flow on color Doppler imaging. Superficial Great Saphenous Vein: No evidence of thrombus. Normal compressibility. Venous Reflux:  None. Other Findings:  None. IMPRESSION: No evidence of DVT within either lower extremity. Electronically Signed   By: Simonne Come M.D.   On: 02/24/2018 16:34   Dg Chest Port 1 View  Result Date: 02/25/2018 CLINICAL DATA:  Hx of COPD, CHF, CAD. Former smoker 2012 Now with increasing SOB, pt on CPAP EXAM: PORTABLE CHEST 1 VIEW COMPARISON:  02/24/2018 FINDINGS: There is stable elevation of the RIGHT hemidiaphragm. Patient is rotated towards the LEFT. The heart size is probably normal. There is persistent opacity  in the LEFT lung base partially obscuring the hemidiaphragm. No pulmonary edema. Remote rib fractures. IMPRESSION: Persistent LEFT LOWER lobe atelectasis or infiltrate. Electronically Signed   By: Norva Pavlov M.D.   On: 02/25/2018 09:41   Dg Chest Port 1 View  Result Date: 02/24/2018 CLINICAL DATA:  Acute onset of dyspnea. EXAM: PORTABLE CHEST 1 VIEW COMPARISON:  Chest radiograph performed 02/01/2018 FINDINGS: The lungs are hypoexpanded. A small left pleural effusion is noted. There is elevation of the right hemidiaphragm. Vascular crowding is noted. Left basilar airspace opacification could reflect pneumonia, depending on the patient's symptoms. There is no evidence of pneumothorax. The  cardiomediastinal silhouette is within normal limits. No acute osseous abnormalities are seen. Cervical spinal fusion hardware is partially imaged. IMPRESSION: Lungs hypoexpanded. Small left pleural effusion noted. Left basilar airspace opacification could reflect pneumonia, depending on the patient's symptoms. Electronically Signed   By: Roanna Raider M.D.   On: 02/24/2018 04:39    Scheduled Meds: . acetaZOLAMIDE  250 mg Intravenous Once  . ammonium lactate   Topical BID  . apixaban  5 mg Oral BID  . azithromycin  250 mg Oral Daily  . carvedilol  3.125 mg Oral BID WC  . chlorhexidine  15 mL Mouth Rinse BID  . cholecalciferol  1,000 Units Oral Daily  . clotrimazole   Topical BID  . docusate sodium  100 mg Oral BID  . fluticasone  2 spray Each Nare Daily  . fluticasone furoate-vilanterol  1 puff Inhalation Daily  . furosemide  20 mg Intravenous Q12H  . insulin aspart  0-5 Units Subcutaneous QHS  . insulin aspart  0-9 Units Subcutaneous TID WC  . ipratropium-albuterol  3 mL Nebulization Q6H  . lactose free nutrition  237 mL Oral TID WC  . lisinopril  2.5 mg Oral Daily  . loratadine  10 mg Oral Daily  . mouth rinse  15 mL Mouth Rinse q12n4p  . [START ON 02/26/2018] medroxyPROGESTERone  10 mg Oral Daily  . methylPREDNISolone (SOLU-MEDROL) injection  40 mg Intravenous Q12H  . pramipexole  0.5 mg Oral TID   Continuous Infusions:  Assessment/Plan:   1. Acute hypoxic hypercarbic respiratory failure.  Patient required BiPAP initially and was admitted to the ICU.  With his PCO2 greater than 100 he is a candidate for noninvasive ventilation at night.  Care manager to look into this. 2. COPD exacerbation continue Solu-Medrol IV and nebulizer treatments 3. Atrial fibrillation on Eliquis for anticoagulation and Coreg for rate control 4. Pneumonia.  Rocephin and Zithromax 5. Fungal infection feet Lac-Hydrin lotion and antifungal cream 6. Weakness physical therapy evaluation 7. Edema and  anasarca.  Lasix 20 mg IV twice daily  Code Status:     Code Status Orders  (From admission, onward)         Start     Ordered   02/24/18 1659  Do not attempt resuscitation (DNR)  Continuous    Question Answer Comment  In the event of cardiac or respiratory ARREST Do not call a "code blue"   In the event of cardiac or respiratory ARREST Do not perform Intubation, CPR, defibrillation or ACLS   In the event of cardiac or respiratory ARREST Use medication by any route, position, wound care, and other measures to relive pain and suffering. May use oxygen, suction and manual treatment of airway obstruction as needed for comfort.      02/24/18 1658        Code Status History    Date  Active Date Inactive Code Status Order ID Comments User Context   02/24/2018 0835 02/24/2018 1658 Full Code [id number]  Arnaldo Natal, MD Inpatient   02/01/2018 1735 02/04/2018 1822 Full Code [id number]  Bertrum Sol, MD Inpatient   06/17/2016 2320 06/22/2016 1628 Full Code [id number]  Gwendolyn Fill, NP ED     Family Communication: Family at the bedside Disposition Plan: Evaluate on a day-to-day basis  Consultants: -Cardiology  Antibiotics:  Rocephin  Zithromax  Time spent: 28 minutes  Lurlene Ronda [employer]

## 2018-02-26 LAB — BLOOD GAS, ARTERIAL
ACID-BASE EXCESS: 20.7 mmol/L — AB (ref 0.0–2.0)
Acid-Base Excess: 19.8 mmol/L — ABNORMAL HIGH (ref 0.0–2.0)
BICARBONATE: 48.7 mmol/L — AB (ref 20.0–28.0)
Bicarbonate: 49.7 mmol/L — ABNORMAL HIGH (ref 20.0–28.0)
FIO2: 0.36
FIO2: 28
O2 SAT: 91.6 %
O2 Saturation: 82 %
PATIENT TEMPERATURE: 37
PCO2 ART: 88 mmHg — AB (ref 32.0–48.0)
PCO2 ART: 70 mmHg — AB (ref 32.0–48.0)
PH ART: 7.45 (ref 7.350–7.450)
Patient temperature: 37
pH, Arterial: 7.36 (ref 7.350–7.450)
pO2, Arterial: 65 mmHg — ABNORMAL LOW (ref 83.0–108.0)
pO2, Arterial: 44 mmHg — ABNORMAL LOW (ref 83.0–108.0)

## 2018-02-26 LAB — GLUCOSE, CAPILLARY
Glucose-Capillary: 117 mg/dL — ABNORMAL HIGH (ref 70–99)
Glucose-Capillary: 183 mg/dL — ABNORMAL HIGH (ref 70–99)
Glucose-Capillary: 84 mg/dL (ref 70–99)
Glucose-Capillary: 121 mg/dL — ABNORMAL HIGH (ref 70–99)

## 2018-02-26 LAB — BASIC METABOLIC PANEL
ANION GAP: 6 (ref 5–15)
BUN: 31 mg/dL — ABNORMAL HIGH (ref 8–23)
CALCIUM: 8.8 mg/dL — AB (ref 8.9–10.3)
CO2: 42 mmol/L — AB (ref 22–32)
CREATININE: 0.72 mg/dL (ref 0.61–1.24)
Chloride: 88 mmol/L — ABNORMAL LOW (ref 98–111)
Glucose, Bld: 138 mg/dL — ABNORMAL HIGH (ref 70–99)
Potassium: 4 mmol/L (ref 3.5–5.1)
SODIUM: 136 mmol/L (ref 135–145)

## 2018-02-26 LAB — BLOOD GAS, VENOUS
ACID-BASE EXCESS: 27.3 mmol/L — AB (ref 0.0–2.0)
BICARBONATE: 59 mmol/L — AB (ref 20.0–28.0)
PH VEN: 7.37 (ref 7.250–7.430)
Patient temperature: 37
pCO2, Ven: 102 mmHg (ref 44.0–60.0)

## 2018-02-26 LAB — MAGNESIUM: MAGNESIUM: 2.2 mg/dL (ref 1.7–2.4)

## 2018-02-26 MED ORDER — PREDNISONE 20 MG PO TABS
40.0000 mg | ORAL_TABLET | Freq: Every day | ORAL | Status: DC
  Administered 2018-02-26 – 2018-02-27 (×2): 40 mg via ORAL
  Filled 2018-02-26 (×2): qty 2

## 2018-02-26 NOTE — Progress Notes (Signed)
Pt and family expressed the desire to get an Advance Directive completed, chaplain educated pt and family.  They reported the previous chaplain had educated them on the subject.    02/26/18 1900  Clinical Encounter Type  Visited With Patient and family together  Visit Type Follow-up;Spiritual support  Referral From Nurse  Consult/Referral To Chaplain  Advance Directives (For Healthcare)  Does Patient Have a Medical Advance Directive? No  Would patient like information on creating a medical advance directive? Yes (Inpatient - patient requests chaplain consult to create a medical advance directive)  Mental Health Advance Directives  Does Patient Have a Mental Health Advance Directive? No  Chaplain apologized for the delay and offered emotional support.

## 2018-02-26 NOTE — Progress Notes (Signed)
Patient refused Bipap at this time.

## 2018-02-26 NOTE — Evaluation (Signed)
Physical Therapy Evaluation Patient Details Name: Scott Richard MRN: [id number] DOB: April 12, 1936 Today's Date: 02/26/2018   History of Present Illness  Scott Richard is an 82yo male who comes to [facility] on 11/14 d/t SOB reporting he thought he was going to die. PMH: COPD, CHF, HTN, CAD, baseline 3L O2 use.   Clinical Impression  Pt admitted with above diagnosis. Pt currently with functional limitations due to the deficits listed below (see "PT Problem List"). Upon entry, pt in bed, no family/caregiver present. The pt is awake and agreeable to participate. Pt able to mobilize to EOB without physical assist, but coming to standing from EOB or chair is difficulty, requiring modA lift assist. Pt established balance well once up, desat to 86% Richard stand pivot to chair, but maintains SpO2 @ 96% AMB on 5L in room. Functional mobility assessment demonstrates increased effort/time requirements, poor tolerance, and need for physical assistance, whereas the patient performed these at a higher level of independence PTA. Pt offered table and lunch tray at end of session, but he reports recently having finished his breakfast and not having room for additional food at the moment. Pt will benefit from skilled PT intervention to increase independence and safety with basic mobility in preparation for discharge to the venue listed below.       Follow Up Recommendations Home health PT    Equipment Recommendations  None recommended by PT    Recommendations for Other Services       Precautions / Restrictions Precautions Precautions: None Restrictions Weight Bearing Restrictions: No      Mobility  Bed Mobility Overal bed mobility: Needs Assistance;Modified Independent Bed Mobility: Supine to Sit     Supine to sit: Supervision;Modified independent (Device/Increase time)     General bed mobility comments: fairly adequate trunk strength overall, mild weakness, but able to perform.   Transfers Overall  transfer level: Needs assistance Equipment used: Rolling walker (2 wheeled);1 person hand held assist Transfers: Sit to/from Stand Sit to Stand: Mod assist         General transfer comment: steady once standing tall.   Ambulation/Gait Ambulation/Gait assistance: Min guard Gait Distance (Feet): 28 Feet Assistive device: Rolling walker (2 wheeled) Gait Pattern/deviations: Step-to pattern     General Gait Details: Cued to AMB fwd then retro at bedside with RW 2x, good RW management, no LOB, maintained nasal canual but turned up to 5L for AMB after desat with SPT on 3L.   Stairs            Wheelchair Mobility    Modified Rankin (Stroke Patients Only)       Balance Overall balance assessment: Needs assistance;History of Falls         Standing balance support: Bilateral upper extremity supported Standing balance-Leahy Scale: Fair                               Pertinent Vitals/Pain Pain Assessment: No/denies pain    Home Living Family/patient expects to be discharged to:: Private residence Living Arrangements: Other relatives(Sister ) Available Help at Discharge: Family Type of Home: Mobile home Home Access: Ramped entrance     Home Layout: One level Home Equipment: Environmental consultantWalker - 2 wheels;Wheelchair - power;Hospital bed      Prior Function Level of Independence: Independent with assistive device(s)         Comments: RW and O2 in home, previously using power chair in community, but not mentioned this  admission     Hand Dominance        Extremity/Trunk Assessment   Upper Extremity Assessment Upper Extremity Assessment: Generalized weakness    Lower Extremity Assessment Lower Extremity Assessment: Generalized weakness       Communication   Communication: Expressive difficulties(slurred speech, pt reports he's ahd some mild strokes in the past)  Cognition Arousal/Alertness: Awake/alert Behavior During Therapy: WFL for tasks  assessed/performed Overall Cognitive Status: Within Functional Limits for tasks assessed                                        General Comments      Exercises     Assessment/Plan    PT Assessment Patient needs continued PT services  PT Problem List Decreased strength;Decreased activity tolerance;Decreased balance;Decreased mobility       PT Treatment Interventions DME instruction;Balance training;Gait training;Functional mobility training;Therapeutic activities;Therapeutic exercise;Patient/family education    PT Goals (Current goals can be found in the Care Plan section)  Acute Rehab PT Goals Patient Stated Goal: regain transfers strength  PT Goal Formulation: With patient Time For Goal Achievement: 03/05/18 Potential to Achieve Goals: Good    Frequency Min 2X/week   Barriers to discharge        Co-evaluation               AM-PAC PT "6 Clicks" Daily Activity  Outcome Measure Difficulty turning over in bed (including adjusting bedclothes, sheets and blankets)?: A Lot Difficulty moving from lying on back to sitting on the side of the bed? : A Lot Difficulty sitting down on and standing up from a chair with arms (e.g., wheelchair, bedside commode, etc,.)?: Unable Help needed moving to and from a bed to chair (including a wheelchair)?: A Lot Help needed walking in hospital room?: A Little Help needed climbing 3-5 steps with a railing? : A Lot 6 Click Score: 12    End of Session Equipment Utilized During Treatment: Oxygen Activity Tolerance: Patient tolerated treatment well Patient left: in chair;with call bell/phone within reach(tray presented ) Nurse Communication: Mobility status PT Visit Diagnosis: Difficulty in walking, not elsewhere classified (R26.2);Muscle weakness (generalized) (M62.81)    Time: 0981-1914 PT Time Calculation (min) (ACUTE ONLY): 30 min   Charges:   PT Evaluation $PT Eval Moderate Complexity: 1 Mod PT  Treatments $Therapeutic Activity: 8-22 mins        12:48 PM, 02/26/18 Scott Richard, PT, DPT Physical Therapist - [facility]  [phone] (ASCOM)    Buccola,Scott Richard 02/26/2018, 12:45 PM

## 2018-02-26 NOTE — Progress Notes (Signed)
[facility] - [place] at [facility]   PATIENT NAME: Scott Richard    MR#:  [id number]  DATE OF BIRTH:  06/04/1935  SUBJECTIVE:  CHIEF COMPLAINT:   Chief Complaint  Patient presents with  . Shortness of Breath  . Sore Throat  Patient seen and evaluated today Has cough Decrease shortness of breath Comfortable on oxygen via nasal cannula  REVIEW OF SYSTEMS:    ROS  CONSTITUTIONAL: No documented fever. No fatigue, weakness. No weight gain, no weight loss.  EYES: No blurry or double vision.  ENT: No tinnitus. No postnasal drip. No redness of the oropharynx.  RESPIRATORY: Has cough, no wheeze, no hemoptysis. Has dyspnea.  CARDIOVASCULAR: No chest pain. No orthopnea. No palpitations. No syncope.  GASTROINTESTINAL: No nausea, no vomiting or diarrhea. No abdominal pain. No melena or hematochezia.  GENITOURINARY: No dysuria or hematuria.  ENDOCRINE: No polyuria or nocturia. No heat or cold intolerance.  HEMATOLOGY: No anemia. No bruising. No bleeding.  INTEGUMENTARY: No rashes. No lesions.  MUSCULOSKELETAL: No arthritis. No swelling. No gout.  NEUROLOGIC: No numbness, tingling, or ataxia. No seizure-type activity.  PSYCHIATRIC: No anxiety. No insomnia. No ADD.   DRUG ALLERGIES:  No Known Allergies  VITALS:  Blood pressure (!) 98/52, pulse 76, temperature 97.9 F (36.6 C), temperature source Oral, resp. rate 16, height 5\' 5"  (1.651 m), weight 72 kg, SpO2 (!) 86 %.  PHYSICAL EXAMINATION:   Physical Exam  GENERAL:  82 y.o.-year-old patient lying in the bed with no acute distress.  EYES: Pupils equal, round, reactive to light and accommodation. No scleral icterus. Extraocular muscles intact.  HEENT: Head atraumatic, normocephalic. Oropharynx and nasopharynx clear.  NECK:  Supple, no jugular venous distention. No thyroid enlargement, no tenderness.  LUNGS: Decreased breath sounds bilaterally, bilateral rhonchi noted. No use of accessory muscles of respiration.   CARDIOVASCULAR: S1, S2 normal. No murmurs, rubs, or gallops.  ABDOMEN: Soft, nontender, nondistended. Bowel sounds present. No organomegaly or mass.  EXTREMITIES: No cyanosis, clubbing or edema b/l.    NEUROLOGIC: Cranial nerves II through XII are intact. No focal Motor or sensory deficits b/l.   PSYCHIATRIC: The patient is alert and oriented x 3.  SKIN: No obvious rash, lesion, or ulcer.   LABORATORY PANEL:   CBC Recent Labs  Lab 02/25/18 0221  WBC 7.1  HGB 11.3*  HCT 36.2*  PLT 190   ------------------------------------------------------------------------------------------------------------------ Chemistries  Recent Labs  Lab 02/25/18 0221 02/26/18 0431  NA 136 136  K 3.6 4.0  CL 83* 88*  CO2 38* 42*  GLUCOSE 135* 138*  BUN 23 31*  CREATININE 0.65 0.72  CALCIUM 8.8* 8.8*  MG 1.6* 2.2  AST 19  --   ALT 18  --   ALKPHOS 41  --   BILITOT 0.9  --    ------------------------------------------------------------------------------------------------------------------  Cardiac Enzymes Recent Labs  Lab 02/25/18 0221  TROPONINI <0.03   ------------------------------------------------------------------------------------------------------------------  RADIOLOGY:  US Venous Img Lower Bilateral  Result Date: 02/24/2018 CLINICAL DATA:  Bilateral lower extremity edema.  Evaluate for DVT. EXAM: BILATERAL LOWER EXTREMITY VENOUS DOPPLER ULTRASOUND TECHNIQUE: Gray-scale sonography with graded compression, as well as color Doppler and duplex ultrasound were performed to evaluate the lower extremity deep venous systems from the level of the common femoral vein and including the common femoral, femoral, profunda femoral, popliteal and calf veins including the posterior tibial, peroneal and gastrocnemius veins when visible. The superficial great saphenous vein was also interrogated. Spectral Doppler was utilized to evaluate flow at rest  and with distal augmentation maneuvers in the  common femoral, femoral and popliteal veins. COMPARISON:  None. FINDINGS: RIGHT LOWER EXTREMITY Common Femoral Vein: No evidence of thrombus. Normal compressibility, respiratory phasicity and response to augmentation. Saphenofemoral Junction: No evidence of thrombus. Normal compressibility and flow on color Doppler imaging. Profunda Femoral Vein: No evidence of thrombus. Normal compressibility and flow on color Doppler imaging. Femoral Vein: No evidence of thrombus. Normal compressibility, respiratory phasicity and response to augmentation. Popliteal Vein: No evidence of thrombus. Normal compressibility, respiratory phasicity and response to augmentation. Calf Veins: No evidence of thrombus. Normal compressibility and flow on color Doppler imaging. Superficial Great Saphenous Vein: No evidence of thrombus. Normal compressibility. Venous Reflux:  None. Other Findings:  None. LEFT LOWER EXTREMITY Common Femoral Vein: No evidence of thrombus. Normal compressibility, respiratory phasicity and response to augmentation. Saphenofemoral Junction: No evidence of thrombus. Normal compressibility and flow on color Doppler imaging. Profunda Femoral Vein: No evidence of thrombus. Normal compressibility and flow on color Doppler imaging. Femoral Vein: No evidence of thrombus. Normal compressibility, respiratory phasicity and response to augmentation. Popliteal Vein: No evidence of thrombus. Normal compressibility, respiratory phasicity and response to augmentation. Calf Veins: No evidence of thrombus. Normal compressibility and flow on color Doppler imaging. Superficial Great Saphenous Vein: No evidence of thrombus. Normal compressibility. Venous Reflux:  None. Other Findings:  None. IMPRESSION: No evidence of DVT within either lower extremity. Electronically Signed   By: Simonne ComeJohn  Watts M.D.   On: 02/24/2018 16:34   Dg Chest Port 1 View  Result Date: 02/25/2018 CLINICAL DATA:  Hx of COPD, CHF, CAD. Former smoker 2012 Now with  increasing SOB, pt on CPAP EXAM: PORTABLE CHEST 1 VIEW COMPARISON:  02/24/2018 FINDINGS: There is stable elevation of the RIGHT hemidiaphragm. Patient is rotated towards the LEFT. The heart size is probably normal. There is persistent opacity in the LEFT lung base partially obscuring the hemidiaphragm. No pulmonary edema. Remote rib fractures. IMPRESSION: Persistent LEFT LOWER lobe atelectasis or infiltrate. Electronically Signed   By: Norva PavlovElizabeth  Brown M.D.   On: 02/25/2018 09:41     ASSESSMENT AND PLAN:   82 year old male patient with history of COPD, atrial fibrillation currently under hospitalist service for respiratory distress and COPD flare  -Acute hypoxic hypercapnic respiratory failure improved Off BiPAP Currently transferred to medical floor Nebulization treatments  -Acute COPD exacerbation improving Discontinue IV Solu-Medrol Start oral steroids Continue nebulization treatments  -Atrial fibrillation Continue Eliquis for anticoagulation And continue beta-blocker for rate control  -Pneumonia Continue Rocephin and Zithromax antibiotics  -Generalized edema On diuresis with Lasix  -Physical therapy evaluation appreciated  -Disposition Home with home health services in a.m.  All the records are reviewed and case discussed with Care Management/Social Worker. Management plans discussed with the patient, family and they are in agreement.  CODE STATUS: DNR  DVT Prophylaxis: SCDs  TOTAL TIME TAKING CARE OF THIS PATIENT: 35  minutes.   POSSIBLE D/C IN 1 to 2 DAYS, DEPENDING ON CLINICAL CONDITION.  Ihor AustinPavan Pyreddy M.D on 02/26/2018 at 1:53 PM  Between 7am to 6pm - Pager - [phone]  After 6pm go to [email or web address] - password [email or web address] [facility]  [facility]  Office  [phone]  CC: Primary care physician; Lyndon CodeKhan, Fozia M, MD  Note: This dictation was prepared with Dragon dictation along with smaller phrase technology. Any transcriptional errors that result  from this process are unintentional.

## 2018-02-26 NOTE — Progress Notes (Addendum)
Pharmacy Electrolyte Monitoring Consult:  Pharmacy consulted to assist in monitoring and replacing electrolytes in this 82 y.o. male admitted on 02/24/2018 with HCAP.   Labs:  Sodium (mmol/L)  Date Value  02/26/2018 136   Potassium (mmol/L)  Date Value  02/26/2018 4.0   Magnesium (mg/dL)  Date Value  91/47/829511/16/2019 2.2   Phosphorus (mg/dL)  Date Value  62/13/086503/11/2016 3.0   Calcium (mg/dL)  Date Value  78/46/962911/16/2019 8.8 (L)   Albumin (g/dL)  Date Value  52/84/132411/15/2019 3.0 (L)    Assessment/Plan:   Will replace with goal of potassium ~4 and magnesium ~2.  No supplementation is warranted at this time. Will order electrolytes with AM labs.   Thank you for allowing pharmacy to participate in this patient's care.    Stormy CardKatsoudas,Rayshad Riviello K, [facility] 02/26/2018 7:41 AM

## 2018-02-26 NOTE — Evaluation (Signed)
Occupational Therapy Evaluation Patient Details Name: Scott Richard MRN: [id number] DOB: 08-08-1935 Today's Date: 02/26/2018    History of Present Illness Scott Richard is an 82yo male who comes to [facility] on 11/14 d/t SOB reporting he thought he was going to die. PMH: COPD, CHF, HTN, CAD, baseline 3L O2 use.    Clinical Impression    Pt was seen at bedside up in chair - pt visiting with several family members - including sister he lives with. Pt has somebody that comes in for 6 hrs daily and assist him - pt do get up from bed <> chair and go to bathroom himself - night time to - did not had any falls the last 6 months per pt and sister - pt was CG chair to standing and simulate clothing management. Did not shot LOB and used walker. Pt show R UE deficits in ROM and strength - has history of stroke and cervical fx.  Pt do have hospital bed and lift chair at home. No OT services indicated at this time   Follow Up Recommendations  No OT follow up    Equipment Recommendations    Pt has hospital bed, lift chair, walker , shower chair with railing    Recommendations for Other Services  HHPT     Precautions / Restrictions Precautions Precautions: None Restrictions Weight Bearing Restrictions: No      Mobility   Balance    ADL either performed or assessed with clinical judgement   ADL Overall ADL's : (Pt has assistance 6 hrs day for ADL's and house work )                                       General ADL Comments: pt transfer from bed <>  chair , go to bathroom himself , and back to bed - wear depend and could do that simulation this date with S using walker in front of chair     Vision         Perception     Praxis      Pertinent Vitals/Pain Pain Assessment: No/denies pain     Hand Dominance     Extremity/Trunk Assessment Upper Extremity Assessment Upper Extremity Assessment: LUE deficits/detail LUE Deficits / Details: Pt has history of stroke -  gross grasp - elbow flexion and extetnion - shouder impaired - also has history of cervical fx 8 yrs ago   Lower Extremity Assessment Lower Extremity Assessment: Generalized weakness       Communication Communication Communication: Expressive difficulties(slurred speech, pt reports he's ahd some mild strokes in the past)   Cognition Arousal/Alertness: Awake/alert Behavior During Therapy: WFL for tasks assessed/performed Overall Cognitive Status: Within Functional Limits for tasks assessed                                     General Comments       Exercises     Shoulder Instructions      Home Living Family/patient expects to be discharged to:: Private residence Living Arrangements: Other relatives Available Help at Discharge: Family Type of Home: Mobile home Home Access: Ramped entrance     Home Layout: One level               Home Equipment: Environmental consultant - 2 wheels;Wheelchair - power;Hospital bed(power  chair)  Prior Functioning/Environment Level of Independence: Independent with assistive device(s)        Comments: RW and O2 in home, previously using power chair in community, but not mentioned this admission        OT Problem List:        OT Treatment/Interventions:      OT Goals(Current goals can be found in the care plan section) Acute Rehab OT Goals Patient Stated Goal: go home again OT Goal Formulation: With patient/family  OT Frequency:     Barriers to D/C:            Co-evaluation              AM-PAC PT "6 Clicks" Daily Activity     Outcome Measure Help from another person eating meals?: Total Help from another person taking care of personal grooming?: A Little Help from another person toileting, which includes using toliet, bedpan, or urinal?: None Help from another person bathing (including washing, rinsing, drying)?: Total Help from another person to put on and taking off regular upper body clothing?:  Total Help from another person to put on and taking off regular lower body clothing?: Total 6 Click Score: 11   End of Session Equipment Utilized During Treatment: Gait belt  Activity Tolerance: Patient tolerated treatment well Patient left: in chair;with family/visitor present;with chair alarm set  OT Visit Diagnosis: Unsteadiness on feet (R26.81)                Time: 0454-09811515-1535 OT Time Calculation (min): 20 min Charges:  OT Evaluation $OT Eval Low Complexity: 1 Low    Jaja Switalski OTR/L,CLT 02/26/2018, 3:55 PM

## 2018-02-27 ENCOUNTER — Other Ambulatory Visit: Payer: Self-pay | Admitting: Internal Medicine

## 2018-02-27 ENCOUNTER — Other Ambulatory Visit: Payer: Self-pay | Admitting: Adult Health

## 2018-02-27 LAB — GLUCOSE, CAPILLARY: Glucose-Capillary: 100 mg/dL — ABNORMAL HIGH (ref 70–99)

## 2018-02-27 LAB — MAGNESIUM: Magnesium: 2 mg/dL (ref 1.7–2.4)

## 2018-02-27 LAB — BASIC METABOLIC PANEL
ANION GAP: 8 (ref 5–15)
BUN: 29 mg/dL — AB (ref 8–23)
CALCIUM: 8.6 mg/dL — AB (ref 8.9–10.3)
CO2: 39 mmol/L — AB (ref 22–32)
Chloride: 85 mmol/L — ABNORMAL LOW (ref 98–111)
Creatinine, Ser: 0.68 mg/dL (ref 0.61–1.24)
GFR calc Af Amer: >60 mL/min (ref >60)
GLUCOSE: 116 mg/dL — AB (ref 70–99)
Potassium: 4.1 mmol/L (ref 3.5–5.1)
Sodium: 132 mmol/L — ABNORMAL LOW (ref 135–145)

## 2018-02-27 MED ORDER — PREDNISONE 10 MG PO TABS: 10.0000 mg | ORAL_TABLET | Freq: Every day | ORAL | 0 refills | Status: DC

## 2018-02-27 MED ORDER — FUROSEMIDE 20 MG PO TABS: 40.0000 mg | ORAL_TABLET | Freq: Every day | ORAL | 0 refills | Status: AC

## 2018-02-27 MED ORDER — LEVOFLOXACIN 500 MG PO TABS: 500.0000 mg | ORAL_TABLET | Freq: Every day | ORAL | 0 refills | Status: AC

## 2018-02-27 MED ORDER — OXYCODONE HCL 5 MG PO TABS: 5.0000 mg | ORAL_TABLET | Freq: Four times a day (QID) | ORAL | 0 refills | Status: AC | PRN

## 2018-02-27 MED ORDER — APIXABAN 5 MG PO TABS: 5.0000 mg | ORAL_TABLET | Freq: Two times a day (BID) | ORAL | 0 refills | Status: AC

## 2018-02-27 MED ORDER — POTASSIUM CHLORIDE ER 20 MEQ PO TBCR: 20.0000 meq | EXTENDED_RELEASE_TABLET | Freq: Every day | ORAL | 0 refills | Status: DC

## 2018-02-27 NOTE — Progress Notes (Signed)
   02/27/18 1430  Clinical Encounter Type  Visited With Patient not available  Visit Type Follow-up   Attempted follow up per outgoing on-call chaplain's recommendation, saw in system that patient had discharged, visited room to confirm. Order cleared.

## 2018-02-27 NOTE — Care Management (Signed)
Patient continue to exhibit signs of hypercapnia associated with chronic res[iratory failure secondary to severe COPD. Patient requires the use of NIV both QHS and daytime to help with exacerbation periods. The use of the NIV will treat patients high CO2 levels and can reduce the risks of exacerbations and future hospitalizations when used at night and during the day. Patient will need these advanced settings in conjunction with current medication regimen; Bipap is not an option due to its functional limitations and the severity of the patient condition. Failure to have an NIV available for use over a 24 hour period could lead to death.

## 2018-02-27 NOTE — Care Management Note (Signed)
Case Management Note  Patient Details  Name: Scott EasterlyWilbur G Orea MRN: [id number] Date of Birth: 1936-03-28  Subjective/Objective:  Patient to be discharged per MD order. Orders in place for home health services. Patient currently open to [facility] and family preference is to resume care with this agency. Referral confirmed with Rosey Batheresa that patient may resume care. Workup in place for NIV. Form signed and note place by MD. Arneta ClicheFaxed info to LangleyJermaine at [facility]. If patient qualifies for NIV it may be delivered to the home. MD is ok with discharging without NIV in hand. Family to transport.                    Action/Plan:   Expected Discharge Date:  02/27/18               Expected Discharge Plan:     In-House Referral:     Discharge planning Services  CM Consult  Post Acute Care Choice:  Home Health Choice offered to:  Patient, Sibling  DME Arranged:    DME Agency:     HH Arranged:  RN, PT HH Agency:  [facility]  Status of Service:  Completed, signed off  If discussed at [employer] Meetings, dates discussed:    Additional Comments:  Virgel ManifoldJosh A Merlie Noga, RN 02/27/2018, 12:42 PM

## 2018-02-27 NOTE — Progress Notes (Signed)
Pt have a scheduled lasix 20 MG IV BP at 106/59 HR 63. Notify [facility]. Will continue to monitor.  Update 0534: Talked to Dr. Sheryle Hailiamond and ordered to administer lasix 20 mg IV. Will continue to monitor.

## 2018-02-27 NOTE — Progress Notes (Signed)
RN removed IV.  Patient will leave with his sister in a private vehicle.   Christean GriefShylon M Buelah Rennie, RN

## 2018-02-27 NOTE — Progress Notes (Signed)
Pharmacy Electrolyte Monitoring Consult:  Pharmacy consulted to assist in monitoring and replacing electrolytes in this 82 y.o. male admitted on 02/24/2018 with HCAP.   Labs:  Sodium (mmol/L)  Date Value  02/27/2018 132 (L)   Potassium (mmol/L)  Date Value  02/27/2018 4.1   Magnesium (mg/dL)  Date Value  16/10/960411/17/2019 2.0   Phosphorus (mg/dL)  Date Value  54/09/811903/11/2016 3.0   Calcium (mg/dL)  Date Value  14/78/295611/17/2019 8.6 (L)   Albumin (g/dL)  Date Value  21/30/865711/15/2019 3.0 (L)    Assessment/Plan:   Will replace with goal of potassium ~4 and magnesium ~2.  No supplementation is warranted at this time. Will order electrolytes with AM labs.   Thank you for allowing pharmacy to participate in this patient's care.    Stormy CardKatsoudas,Ohana Birdwell K, [facility] 02/27/2018 8:17 AM

## 2018-02-27 NOTE — Progress Notes (Signed)
Advanced care plan.  Purpose of the Encounter: CODE STATUS  Parties in Attendance: Patient  Patient's Decision Capacity: Good  Subjective/Patient's story: Presented to the emergency room for shortness of breath   Objective/Medical story Patient has a pneumonia and COPD exacerbation Needs IV Solu-Medrol, nebulization treatments and antibiotics   Goals of care determination:  Advance care directives and goals of care discussed Patient does not want any CPR, intubation and ventilator if the need arises  CODE STATUS: DNR   Time spent discussing advanced care planning: 16 minutes

## 2018-02-27 NOTE — Discharge Summary (Signed)
[facility] [facility] [place] [facility]   PATIENT NAME: Scott Richard    MR#:  [id number]  DATE OF BIRTH:  20-Jun-1935  DATE OF ADMISSION:  02/24/2018 ADMITTING PHYSICIAN: Arnaldo Natal, MD  DATE OF DISCHARGE: 02/27/2018  PRIMARY CARE PHYSICIAN: Lyndon Code, MD   ADMISSION DIAGNOSIS:  Acute COPD exacerbation Healthcare associated pneumonia Hypertension Congestive heart failure with diastolic dysfunction Atrial fibrillation Hypertension  DISCHARGE DIAGNOSIS:  Active Problems:   HCAP (healthcare-associated pneumonia) Acute COPD exacerbation Congestive heart failure stable with diastolic dysfunction Atrial fibrillation Hypertension  SECONDARY DIAGNOSIS:   Past Medical History:  Diagnosis Date  . Alcohol use   . Arthritis   . Atrial fibrillation (HCC)   . CHF (congestive heart failure) (HCC)   . COPD (chronic obstructive pulmonary disease) (HCC)   . Coronary artery disease   . Hyperlipidemia   . Hypertension   . Stroke [facility])      ADMITTING HISTORY The patient with past medical history of COPD, CHF, hypertension status post stroke and coronary artery disease presents to the emergency department via EMS complaining of shortness of breath.  The patient states that "he thought he was going to die".  He denies chest pain and reports that he is breathing easier now that he has extra supplemental oxygen.  Oxygen saturations upon arrival were approximately 84% and remained so on 4 L of oxygen until he gradually increased to 91%.  Chest x-ray shows possible opacity in left lower lobe.  He received Solu-Medrol and multiple breathing treatments prior to the emergency department staff calling the hospitalist service for admission   HOSPITAL COURSE:  Patient was admitted to stepdown unit initially from the emergency room on BiPAP.  He received IV antibiotics and nebulization treatments aggressively along with IV Solu-Medrol.  Patient continued anticoagulation with  oral Eliquis.  He was weaned off BiPAP and transition to oxygen via nasal cannula.  He was transferred to telemetry.  Patient was weaned down to oxygen via nasal cannula at 3 L which he uses at home.  He was transitioned to oral Steroids.  Patient will be discharged home on oxygen therapy along with antibiotics and steroid tapering.  Home health services have been lined up after discussion with case management.  During the work-up MRSA PCR was negative and blood culture did not reveal any growth.  CONSULTS OBTAINED:  Treatment Team:  Dalia Heading, MD  DRUG ALLERGIES:  No Known Allergies  DISCHARGE MEDICATIONS:   Allergies as of 02/27/2018   No Known Allergies     Medication List    STOP taking these medications   guaiFENesin-dextromethorphan 100-10 MG/5ML syrup Commonly known as:  ROBITUSSIN DM   ipratropium-albuterol 0.5-2.5 (3) MG/3ML Soln Commonly known as:  DUONEB   meloxicam 7.5 MG tablet Commonly known as:  MOBIC     TAKE these medications   acetaminophen 325 MG tablet Commonly known as:  TYLENOL Take 2 tablets (650 mg total) by mouth every 6 (six) hours as needed for mild pain or headache.   apixaban 5 MG Tabs tablet Commonly known as:  ELIQUIS Take 1 tablet (5 mg total) by mouth 2 (two) times daily. What changed:    medication strength  how much to take   carvedilol 3.125 MG tablet Commonly known as:  COREG Take 1 tablet (3.125 mg total) by mouth 2 (two) times daily with a meal. What changed:  when to take this   cetirizine 10 MG tablet Commonly known as:  ZYRTEC  Take 10 mg by mouth daily.   fluticasone 50 MCG/ACT nasal spray Commonly known as:  FLONASE Place 2 sprays into both nostrils daily. What changed:  when to take this   furosemide 20 MG tablet Commonly known as:  LASIX Take 2 tablets (40 mg total) by mouth daily.   lactose free nutrition Liqd Take 237 mLs by mouth 3 (three) times daily with meals.   levofloxacin 500 MG tablet Commonly  known as:  LEVAQUIN Take 1 tablet (500 mg total) by mouth daily for 5 days.   lisinopril 2.5 MG tablet Commonly known as:  PRINIVIL,ZESTRIL Take 1 tablet (2.5 mg total) by mouth daily.   multivitamin capsule Take by mouth.   oxyCODONE 5 MG immediate release tablet Commonly known as:  Oxy IR/ROXICODONE Take 1 tablet (5 mg total) by mouth every 6 (six) hours as needed for up to 5 days for moderate pain.   OXYGEN Inhale 2 L into the lungs continuous.   Potassium Chloride ER 20 MEQ Tbcr Take 20 mEq by mouth daily.   pramipexole 0.5 MG tablet Commonly known as:  MIRAPEX Take 1 tablet (0.5 mg total) by mouth 3 (three) times daily.   predniSONE 10 MG tablet Commonly known as:  DELTASONE Take 1 tablet (10 mg total) by mouth daily. Label  & dispense according to the schedule below.  6 tablets day one, then 5 table day 2, then 4 tablets day 3, then 3 tablets day 4, 2 tablets day 5, then 1 tablet day 6, then stop   SYMBICORT 160-4.5 MCG/ACT inhaler Generic drug:  budesonide-formoterol Inhale 2 puffs into the lungs 2 (two) times daily.   VENTOLIN HFA 108 (90 Base) MCG/ACT inhaler Generic drug:  albuterol Inhale 1-2 puffs into the lungs every 6 (six) hours as needed for wheezing or shortness of breath.   VITAMIN D-1000 MAX ST 25 MCG (1000 UT) tablet Generic drug:  Cholecalciferol Take 1 tablet by mouth daily.       Today  Patient seen and evaluated today Comfortable on oxygen via nasal cannula No chest pain No fever Tolerating diet well   VITAL SIGNS:  Blood pressure 112/67, pulse (!) 56, temperature 97.6 F (36.4 C), temperature source Oral, resp. rate 19, height 5\' 5"  (1.651 m), weight 70 kg, SpO2 90 %.  I/O:    Intake/Output Summary (Last 24 hours) at 02/27/2018 1244 Last data filed at 02/26/2018 2022 Gross per 24 hour  Intake -  Output 0 ml  Net 0 ml    PHYSICAL EXAMINATION:  Physical Exam  GENERAL:  82 y.o.-year-old patient lying in the bed with no acute  distress.  LUNGS: Normal breath sounds bilaterally, no wheezing, rales,rhonchi or crepitation. No use of accessory muscles of respiration.  CARDIOVASCULAR: S1, S2 normal. No murmurs, rubs, or gallops.  ABDOMEN: Soft, non-tender, non-distended. Bowel sounds present. No organomegaly or mass.  NEUROLOGIC: Moves all 4 extremities. PSYCHIATRIC: The patient is alert and oriented x 3.  SKIN: No obvious rash, lesion, or ulcer.   DATA REVIEW:   CBC Recent Labs  Lab 02/25/18 0221  WBC 7.1  HGB 11.3*  HCT 36.2*  PLT 190    Chemistries  Recent Labs  Lab 02/25/18 0221  02/27/18 0510  NA 136   < > 132*  K 3.6   < > 4.1  CL 83*   < > 85*  CO2 38*   < > 39*  GLUCOSE 135*   < > 116*  BUN 23   < >  29*  CREATININE 0.65   < > 0.68  CALCIUM 8.8*   < > 8.6*  MG 1.6*   < > 2.0  AST 19  --   --   ALT 18  --   --   ALKPHOS 41  --   --   BILITOT 0.9  --   --    < > = values in this interval not displayed.    Cardiac Enzymes Recent Labs  Lab 02/25/18 0221  TROPONINI <0.03    Microbiology Results  Results for orders placed or performed during the hospital encounter of 02/24/18  MRSA PCR Screening     Status: None   Collection Time: 02/24/18  8:36 AM  Result Value Ref Range Status   MRSA by PCR NEGATIVE NEGATIVE Final    Comment:        The GeneXpert MRSA Assay (FDA approved for NASAL specimens only), is one component of a comprehensive MRSA colonization surveillance program. It is not intended to diagnose MRSA infection nor to guide or monitor treatment for MRSA infections. Performed at [facility], [address] [place], [place] [address]   Culture, blood (single) w Reflex to ID Panel     Status: None (Preliminary result)   Collection Time: 02/25/18  2:24 PM  Result Value Ref Range Status   Specimen Description BLOOD BLOOD LEFT HAND  Final   Special Requests   Final    BOTTLES DRAWN AEROBIC AND ANAEROBIC Blood Culture adequate volume   Culture   Final    NO  GROWTH 2 DAYS Performed at [facility], [address] [place], [place] [address]    Report Status PENDING  Incomplete    RADIOLOGY:  No results found.  Follow up with PCP in 1 week.  Management plans discussed with the patient, family and they are in agreement.  CODE STATUS: DNR    Code Status Orders  (From admission, onward)         Start     Ordered   02/24/18 1659  Do not attempt resuscitation (DNR)  Continuous    Question Answer Comment  In the event of cardiac or respiratory ARREST Do not call a "code blue"   In the event of cardiac or respiratory ARREST Do not perform Intubation, CPR, defibrillation or ACLS   In the event of cardiac or respiratory ARREST Use medication by any route, position, wound care, and other measures to relive pain and suffering. May use oxygen, suction and manual treatment of airway obstruction as needed for comfort.      02/24/18 1658        Code Status History    Date Active Date Inactive Code Status Order ID Comments User Context   02/24/2018 0835 02/24/2018 1658 Full Code [id number]  Arnaldo Nataliamond, Michael S, MD Inpatient   02/01/2018 1735 02/04/2018 1822 Full Code [id number]  Bertrum SolSalary, Montell D, MD Inpatient   06/17/2016 2320 06/22/2016 1628 Full Code [id number]  Gwendolyn FillVarughese, Bincy S, NP ED      TOTAL TIME TAKING CARE OF THIS PATIENT ON DAY OF DISCHARGE: more than 33 minutes.   Ihor AustinPavan Pyreddy M.D on 02/27/2018 at 12:44 PM  Between 7am to 6pm - Pager - [phone]  After 6pm go to [email or web address] - password [email or web address] [facility]  [facility] [place] [facility]  Office  [phone]  CC: Primary care physician; Lyndon CodeKhan, Fozia M, MD  Note: This dictation was prepared with Dragon dictation along with smaller phrase technology. Any transcriptional errors that result  from this process are unintentional.

## 2018-03-01 DIAGNOSIS — I5020 Unspecified systolic (congestive) heart failure: Secondary | ICD-10-CM | POA: Diagnosis not present

## 2018-03-01 DIAGNOSIS — J441 Chronic obstructive pulmonary disease with (acute) exacerbation: Secondary | ICD-10-CM | POA: Diagnosis not present

## 2018-03-01 DIAGNOSIS — J9690 Respiratory failure, unspecified, unspecified whether with hypoxia or hypercapnia: Secondary | ICD-10-CM | POA: Diagnosis not present

## 2018-03-01 DIAGNOSIS — R269 Unspecified abnormalities of gait and mobility: Secondary | ICD-10-CM | POA: Diagnosis not present

## 2018-03-01 DIAGNOSIS — J9610 Chronic respiratory failure, unspecified whether with hypoxia or hypercapnia: Secondary | ICD-10-CM | POA: Diagnosis not present

## 2018-03-01 DIAGNOSIS — R0602 Shortness of breath: Secondary | ICD-10-CM | POA: Diagnosis not present

## 2018-03-02 ENCOUNTER — Other Ambulatory Visit: Payer: Self-pay

## 2018-03-02 LAB — CULTURE, BLOOD (SINGLE)
CULTURE: NO GROWTH
SPECIAL REQUESTS: ADEQUATE

## 2018-03-02 NOTE — Patient Outreach (Signed)
[name] Care Management  03/02/2018  Scott HolidayWilbur Richard [facility] [birth year] [id number]   [name] RED ON EMMI ALERT Day # 1 Date: 03/02/18 Red Alert Reason:  Unfilled prescriptions? Yes   Outreach attempt: spoke with sister [name].  She is able to verify HIPAA.  She is patient's primary caregiver.  Discussed red alert.  She states that patient has all his medication and working on getting refills for those that do not have refills.  She states patient takes medication as prescribed.  She states that patient has an appointment with PCP next week and she will be taking him. Educated on worsening symptoms and when to seek medical assistance.  She verbalized understanding and declines any further needs.     Plan: RN CM will close case.    [name], RN, MSN [facility] Care Management Care Management Coordinator Direct Line [phone] Toll Free: [phone]  Fax: [phone]

## 2018-03-05 DIAGNOSIS — E43 Unspecified severe protein-calorie malnutrition: Secondary | ICD-10-CM | POA: Diagnosis not present

## 2018-03-05 DIAGNOSIS — I110 Hypertensive heart disease with heart failure: Secondary | ICD-10-CM | POA: Diagnosis not present

## 2018-03-05 DIAGNOSIS — J9611 Chronic respiratory failure with hypoxia: Secondary | ICD-10-CM | POA: Diagnosis not present

## 2018-03-05 DIAGNOSIS — I50813 Acute on chronic right heart failure: Secondary | ICD-10-CM | POA: Diagnosis not present

## 2018-03-05 DIAGNOSIS — Z9981 Dependence on supplemental oxygen: Secondary | ICD-10-CM | POA: Diagnosis not present

## 2018-03-05 DIAGNOSIS — I4891 Unspecified atrial fibrillation: Secondary | ICD-10-CM | POA: Diagnosis not present

## 2018-03-05 DIAGNOSIS — J9612 Chronic respiratory failure with hypercapnia: Secondary | ICD-10-CM | POA: Diagnosis not present

## 2018-03-05 DIAGNOSIS — J441 Chronic obstructive pulmonary disease with (acute) exacerbation: Secondary | ICD-10-CM | POA: Diagnosis not present

## 2018-03-07 ENCOUNTER — Telehealth: Payer: Self-pay | Admitting: Nurse Practitioner

## 2018-03-07 NOTE — Telephone Encounter (Signed)
[facility] [phone] FOR NURSING FOR WOUND CARE 2 TIMES A WEEK FOR 5 WEEKS

## 2018-03-09 ENCOUNTER — Other Ambulatory Visit: Payer: Self-pay

## 2018-03-09 ENCOUNTER — Encounter: Payer: Self-pay | Admitting: Adult Health

## 2018-03-09 ENCOUNTER — Ambulatory Visit (INDEPENDENT_AMBULATORY_CARE_PROVIDER_SITE_OTHER): Payer: Medicare HMO | Admitting: Adult Health

## 2018-03-09 VITALS — BP 102/58 | HR 52 | Resp 16 | Ht 71.0 in | Wt 158.0 lb

## 2018-03-09 DIAGNOSIS — I50813 Acute on chronic right heart failure: Secondary | ICD-10-CM | POA: Diagnosis not present

## 2018-03-09 DIAGNOSIS — J441 Chronic obstructive pulmonary disease with (acute) exacerbation: Secondary | ICD-10-CM | POA: Diagnosis not present

## 2018-03-09 DIAGNOSIS — R531 Weakness: Secondary | ICD-10-CM

## 2018-03-09 DIAGNOSIS — Z9981 Dependence on supplemental oxygen: Secondary | ICD-10-CM

## 2018-03-09 DIAGNOSIS — J9612 Chronic respiratory failure with hypercapnia: Secondary | ICD-10-CM

## 2018-03-09 DIAGNOSIS — J9611 Chronic respiratory failure with hypoxia: Secondary | ICD-10-CM

## 2018-03-09 MED ORDER — CARVEDILOL 3.125 MG PO TABS: 3.1250 mg | ORAL_TABLET | Freq: Two times a day (BID) | ORAL | 3 refills | Status: AC

## 2018-03-09 MED ORDER — LISINOPRIL 2.5 MG PO TABS: 2.5000 mg | ORAL_TABLET | Freq: Every day | ORAL | 3 refills | Status: AC

## 2018-03-09 MED ORDER — ALBUTEROL SULFATE HFA 108 (90 BASE) MCG/ACT IN AERS: INHALATION_SPRAY | RESPIRATORY_TRACT | 2 refills | Status: AC

## 2018-03-09 NOTE — Progress Notes (Signed)
[facility] [place], [place] [address]  Pulmonary Sleep Medicine   Office Visit Note  Patient Name: Scott Richard DOB: 10/25/1935 MRN [id number]  Date of Service: 03/09/2018  Complaints/HPI: Pt is here for follow up from hospital visit.  He was admitted to [facility] on 02/25/18 for acute exacerbation of COPD.  A noninvasive home ventilator set up was recently delivered to the patient's house.  He is not yet compliant with using the machine.  He is apparently having issues getting a seal on the mask.  They are mostly here today to discuss a power wheelchair.  The patient would benefit from a power wheelchair based upon weakness to his bilateral lower extremities as well as his upper extremities.  He currently uses a walker however his upper and lower extremity weakness is increasing making this much more difficult.  The patient is becoming more ataxic and is at risk for falling even with this walker.  Manual wheelchair also has been ruled out as patient's upper extremities are too weak to propel himself.  He mainly needs a motorized wheelchair to get himself to and from the restroom in his home.  Also to get him to the table to eat and to his bed.  A scooter was discussed however was ruled out to the patient's inability to self transfer also that his upper extremity weakness will allow him to steer a scooter with her bars.  The patient is willing to try a motorized wheelchair at home and appears alert and oriented and has mental capacity to operate this wheelchair safely.  His wife stays with him 24 hours a day and she has no reservations about patient using the power wheelchair in the home.   ROS  General: (-) fever, (-) chills, (-) night sweats, (-) weakness Skin: (-) rashes, (-) itching,. Eyes: (-) visual changes, (-) redness, (-) itching. Nose and Sinuses: (-) nasal stuffiness or itchiness, (-) postnasal drip, (-) nosebleeds, (-) sinus trouble. Mouth and Throat: (-)  sore throat, (-) hoarseness. Neck: (-) swollen glands, (-) enlarged thyroid, (-) neck pain. Respiratory: - cough, (-) bloody sputum, - shortness of breath, - wheezing. Cardiovascular: - ankle swelling, (-) chest pain. Lymphatic: (-) lymph node enlargement. Neurologic: (-) numbness, (-) tingling. Psychiatric: (-) anxiety, (-) depression   Current Medication: Outpatient Encounter Medications as of 03/09/2018  Medication Sig  . acetaminophen (TYLENOL) 325 MG tablet Take 2 tablets (650 mg total) by mouth every 6 (six) hours as needed for mild pain or headache.  Marland Kitchen apixaban (ELIQUIS) 5 MG TABS tablet Take 1 tablet (5 mg total) by mouth 2 (two) times daily.  . carvedilol (COREG) 3.125 MG tablet Take 1 tablet (3.125 mg total) by mouth 2 (two) times daily with a meal. (Patient taking differently: Take 3.125 mg by mouth daily. )  . cetirizine (ZYRTEC) 10 MG tablet Take 10 mg by mouth daily.  . Cholecalciferol (VITAMIN D-1000 MAX ST) 1000 units tablet Take 1 tablet by mouth daily.  . fluticasone (FLONASE) 50 MCG/ACT nasal spray Place 2 sprays into both nostrils daily. (Patient taking differently: Place 2 sprays into both nostrils 2 (two) times daily. )  . furosemide (LASIX) 20 MG tablet Take 2 tablets (40 mg total) by mouth daily.  . furosemide (LASIX) 20 MG tablet Take by mouth.  . lactose free nutrition (BOOST) LIQD Take 237 mLs by mouth 3 (three) times daily with meals.  Marland Kitchen lisinopril (PRINIVIL,ZESTRIL) 2.5 MG tablet Take 1 tablet (2.5 mg total) by  mouth daily.  . Multiple Vitamin (MULTIVITAMIN) capsule Take by mouth.  . OXYGEN Inhale 2 L into the lungs continuous.  . potassium chloride 20 MEQ TBCR Take 20 mEq by mouth daily.  . pramipexole (MIRAPEX) 0.5 MG tablet Take 1 tablet (0.5 mg total) by mouth 3 (three) times daily.  . SYMBICORT 160-4.5 MCG/ACT inhaler Inhale 2 puffs into the lungs 2 (two) times daily.  . VENTOLIN HFA 108 (90 Base) MCG/ACT inhaler Inhale 1-2 puffs into the lungs every 6  (six) hours as needed for wheezing or shortness of breath.  . predniSONE (DELTASONE) 10 MG tablet Take 1 tablet (10 mg total) by mouth daily. Label  & dispense according to the schedule below.  6 tablets day one, then 5 table day 2, then 4 tablets day 3, then 3 tablets day 4, 2 tablets day 5, then 1 tablet day 6, then stop (Patient not taking: Reported on 03/09/2018)   No facility-administered encounter medications on file as of 03/09/2018.     Surgical History: Past Surgical History:  Procedure Laterality Date  . NECK SURGERY    . TONSILLECTOMY      Medical History: Past Medical History:  Diagnosis Date  . Alcohol use   . Arthritis   . Atrial fibrillation ([place])   . CHF (congestive heart failure) ([place])   . COPD (chronic obstructive pulmonary disease) ([place])   . Coronary artery disease   . Hyperlipidemia   . Hypertension   . Stroke [facility])     Family History: Family History  Problem Relation Age of Onset  . Prostate cancer Neg Hx   . Bladder Cancer Neg Hx   . Kidney cancer Neg Hx     Social History: Social History   Socioeconomic History  . Marital status: Divorced    Spouse name: Not on file  . Number of children: Not on file  . Years of education: Not on file  . Highest education level: Not on file  Occupational History  . Not on file  Social Needs  . Financial resource strain: Not on file  . Food insecurity:    Worry: Not on file    Inability: Not on file  . Transportation needs:    Medical: Not on file    Non-medical: Not on file  Tobacco Use  . Smoking status: Former Smoker    Last attempt to quit: 2012    Years since quitting: 7.9  . Smokeless tobacco: Never Used  Substance and Sexual Activity  . Alcohol use: No  . Drug use: No  . Sexual activity: Not on file  Lifestyle  . Physical activity:    Days per week: Not on file    Minutes per session: Not on file  . Stress: Not on file  Relationships  . Social connections:    Talks on phone: Not on  file    Gets together: Not on file    Attends religious service: Not on file    Active member of club or organization: Not on file    Attends meetings of clubs or organizations: Not on file    Relationship status: Not on file  . Intimate partner violence:    Fear of current or ex partner: Not on file    Emotionally abused: Not on file    Physically abused: Not on file    Forced sexual activity: Not on file  Other Topics Concern  . Not on file  Social History Narrative  . Not on file  Vital Signs: Blood pressure (!) 102/58, pulse (!) 52, resp. rate 16, height '5\' 11"'$  (1.803 m), weight 158 lb (71.7 kg), SpO2 93 %.  Examination: General Appearance: The patient is well-developed, well-nourished, and in no distress. Skin: Gross inspection of skin unremarkable. Head: normocephalic, no gross deformities. Eyes: no gross deformities noted. ENT: ears appear grossly normal no exudates. Neck: Supple. No thyromegaly. No LAD. Respiratory: clear bilateraly. Cardiovascular: Normal S1 and S2 without murmur or rub. Extremities: No cyanosis. pulses are equal. Neurologic: Alert and oriented. No involuntary movements. Extremity Strength: Upper 4/5 Lower extremity: 3/5  LABS: Recent Results (from the past 2160 hour(s))  Basic metabolic panel     Status: Abnormal   Collection Time: 02/01/18 12:42 PM  Result Value Ref Range   Sodium 133 (L) 135 - 145 mmol/L   Potassium 4.3 3.5 - 5.1 mmol/L   Chloride 84 (L) 98 - 111 mmol/L   CO2 40 (H) 22 - 32 mmol/L   Glucose, Bld 102 (H) 70 - 99 mg/dL   BUN 19 8 - 23 mg/dL   Creatinine, Ser 0.70 0.61 - 1.24 mg/dL   Calcium 9.3 8.9 - 10.3 mg/dL   GFR calc non Af Amer >60 >60 mL/min   GFR calc Af Amer >60 >60 mL/min    Comment: (NOTE) The eGFR has been calculated using the CKD EPI equation. This calculation has not been validated in all clinical situations. eGFR's persistently <60 mL/min signify possible Chronic Kidney Disease.    Anion gap 9 5 - 15     Comment: Performed at [facility], [address] [place], [place] [address]  Troponin I     Status: None   Collection Time: 02/01/18 12:42 PM  Result Value Ref Range   Troponin I <0.03 <0.03 ng/mL    Comment: Performed at [facility], [address] [place], [place] [address]  CBC with Differential     Status: Abnormal   Collection Time: 02/01/18 12:42 PM  Result Value Ref Range   WBC 11.0 (H) 4.0 - 10.5 K/uL   RBC 4.14 (L) 4.22 - 5.81 MIL/uL   Hemoglobin 12.3 (L) 13.0 - 17.0 g/dL   HCT 39.5 39.0 - 52.0 %   MCV 95.4 80.0 - 100.0 fL   MCH 29.7 26.0 - 34.0 pg   MCHC 31.1 30.0 - 36.0 g/dL   RDW 13.4 11.5 - 15.5 %   Platelets 201 150 - 400 K/uL   nRBC 0.0 0.0 - 0.2 %   Neutrophils Relative % 76 %   Neutro Abs 8.4 (H) 1.7 - 7.7 K/uL   Lymphocytes Relative 8 %   Lymphs Abs 0.8 0.7 - 4.0 K/uL   Monocytes Relative 14 %   Monocytes Absolute 1.5 (H) 0.1 - 1.0 K/uL   Eosinophils Relative 1 %   Eosinophils Absolute 0.1 0.0 - 0.5 K/uL   Basophils Relative 1 %   Basophils Absolute 0.1 0.0 - 0.1 K/uL   Immature Granulocytes 0 %   Abs Immature Granulocytes 0.04 0.00 - 0.07 K/uL    Comment: Performed at [facility], [address] [place], [place] [address]  Troponin I (q 6hr x 3)     Status: None   Collection Time: 02/01/18  5:06 PM  Result Value Ref Range   Troponin I <0.03 <0.03 ng/mL    Comment: Performed at [facility], [address] [place], [place] [address]  Troponin I (q 6hr x 3)     Status: None  Collection Time: 02/01/18  6:27 PM  Result Value Ref Range   Troponin I <0.03 <0.03 ng/mL    Comment: Performed at [facility], [address] [place], [place] [address]  Protime-INR     Status: None   Collection Time: 02/01/18  6:27 PM  Result Value Ref Range   Prothrombin Time 12.9 11.4 - 15.2 seconds   INR 0.98     Comment: Performed at [facility], [address] [place], [place] [address]  APTT      Status: None   Collection Time: 02/01/18  6:27 PM  Result Value Ref Range   aPTT 32 24 - 36 seconds    Comment: Performed at [facility], [address] [place], [place] [address]  Troponin I (q 6hr x 3)     Status: Abnormal   Collection Time: 02/02/18 12:04 AM  Result Value Ref Range   Troponin I 0.03 (HH) <0.03 ng/mL    Comment: CRITICAL RESULT CALLED TO, READ BACK BY AND VERIFIED WITH CHARLOTTE KYEI '@0045'$  02/02/18 [facility] Performed at [facility], [address] [place], [place] [address]   Troponin I (q 6hr x 3)     Status: Abnormal   Collection Time: 02/02/18  5:45 AM  Result Value Ref Range   Troponin I 0.03 (HH) <0.03 ng/mL    Comment: CRITICAL VALUE NOTED. VALUE IS CONSISTENT WITH PREVIOUSLY REPORTED/CALLED VALUE JJB Performed at [facility], [address] [place], [place] [address]   Basic metabolic panel     Status: Abnormal   Collection Time: 02/02/18  5:45 AM  Result Value Ref Range   Sodium 134 (L) 135 - 145 mmol/L   Potassium 4.1 3.5 - 5.1 mmol/L   Chloride 83 (L) 98 - 111 mmol/L   CO2 38 (H) 22 - 32 mmol/L   Glucose, Bld 141 (H) 70 - 99 mg/dL   BUN 18 8 - 23 mg/dL   Creatinine, Ser 0.55 (L) 0.61 - 1.24 mg/dL   Calcium 8.6 (L) 8.9 - 10.3 mg/dL   GFR calc non Af Amer >60 >60 mL/min   GFR calc Af Amer >60 >60 mL/min    Comment: (NOTE) The eGFR has been calculated using the CKD EPI equation. This calculation has not been validated in all clinical situations. eGFR's persistently <60 mL/min signify possible Chronic Kidney Disease.    Anion gap 13 5 - 15    Comment: Performed at [facility], [address] [place], [place] [address]  CBC     Status: Abnormal   Collection Time: 02/02/18  5:45 AM  Result Value Ref Range   WBC 8.1 4.0 - 10.5 K/uL   RBC 4.09 (L) 4.22 - 5.81 MIL/uL   Hemoglobin 12.0 (L) 13.0 - 17.0 g/dL   HCT 37.7 (L) 39.0 - 52.0 %   MCV 92.2 80.0 - 100.0 fL   MCH 29.3 26.0 - 34.0 pg   MCHC 31.8 30.0 -  36.0 g/dL   RDW 13.3 11.5 - 15.5 %   Platelets 183 150 - 400 K/uL   nRBC 0.0 0.0 - 0.2 %    Comment: Performed at [facility], [address] [place], [place] [address]  Heparin level (unfractionated)     Status: None   Collection Time: 02/02/18  5:45 AM  Result Value Ref Range   Heparin Unfractionated 0.58 0.30 - 0.70 IU/mL    Comment: (NOTE) If heparin results are below expected values, and patient dosage has  been confirmed, suggest follow up testing of antithrombin III levels. Performed at [facility], [address] [place], [place] [address]   ECHOCARDIOGRAM COMPLETE     Status: None   Collection Time: 02/02/18 11:29 AM  Result Value Ref Range   Weight 2,698.43 oz   Height 69 in   BP 134/71 mmHg  Basic metabolic panel     Status: Abnormal   Collection Time: 02/03/18  3:57 AM  Result Value Ref Range   Sodium 130 (L) 135 - 145 mmol/L   Potassium 3.9 3.5 - 5.1 mmol/L   Chloride 80 (L) 98 - 111 mmol/L   CO2 42 (H) 22 - 32 mmol/L   Glucose, Bld 131 (H) 70 - 99 mg/dL   BUN 26 (H) 8 - 23 mg/dL   Creatinine, Ser 0.69 0.61 - 1.24 mg/dL   Calcium 9.1 8.9 - 10.3 mg/dL   GFR calc non Af Amer >60 >60 mL/min   GFR calc Af Amer >60 >60 mL/min    Comment: (NOTE) The eGFR has been calculated using the CKD EPI equation. This calculation has not been validated in all clinical situations. eGFR's persistently <60 mL/min signify possible Chronic Kidney Disease.    Anion gap 8 5 - 15    Comment: Performed at [facility], [address] [place], [place] [address]  Basic metabolic panel     Status: Abnormal   Collection Time: 02/04/18  4:08 AM  Result Value Ref Range   Sodium 135 135 - 145 mmol/L   Potassium 4.6 3.5 - 5.1 mmol/L   Chloride 83 (L) 98 - 111 mmol/L   CO2 41 (H) 22 - 32 mmol/L   Glucose, Bld 119 (H) 70 - 99 mg/dL   BUN 33 (H) 8 - 23 mg/dL   Creatinine, Ser 0.54 (L) 0.61 - 1.24 mg/dL   Calcium 9.0 8.9 - 10.3 mg/dL   GFR calc non Af  Amer >60 >60 mL/min   GFR calc Af Amer >60 >60 mL/min    Comment: (NOTE) The eGFR has been calculated using the CKD EPI equation. This calculation has not been validated in all clinical situations. eGFR's persistently <60 mL/min signify possible Chronic Kidney Disease.    Anion gap 11 5 - 15    Comment: Performed at [facility], [address] [place], [place] [address]  CBC with Differential     Status: Abnormal   Collection Time: 02/24/18  4:20 AM  Result Value Ref Range   WBC 7.5 4.0 - 10.5 K/uL   RBC 4.14 (L) 4.22 - 5.81 MIL/uL   Hemoglobin 12.2 (L) 13.0 - 17.0 g/dL   HCT 40.2 39.0 - 52.0 %   MCV 97.1 80.0 - 100.0 fL   MCH 29.5 26.0 - 34.0 pg   MCHC 30.3 30.0 - 36.0 g/dL   RDW 13.2 11.5 - 15.5 %   Platelets 173 150 - 400 K/uL   nRBC 0.0 0.0 - 0.2 %   Neutrophils Relative % 81 %   Neutro Abs 6.0 1.7 - 7.7 K/uL   Lymphocytes Relative 6 %   Lymphs Abs 0.5 (L) 0.7 - 4.0 K/uL   Monocytes Relative 12 %   Monocytes Absolute 0.9 0.1 - 1.0 K/uL   Eosinophils Relative 1 %   Eosinophils Absolute 0.1 0.0 - 0.5 K/uL   Basophils Relative 0 %   Basophils Absolute 0.0 0.0 - 0.1 K/uL   Immature Granulocytes 0 %   Abs Immature Granulocytes 0.03 0.00 -  0.07 K/uL    Comment: Performed at [facility], [address] [place], [place] [address]  Brain natriuretic peptide     Status: Abnormal   Collection Time: 02/24/18  4:20 AM  Result Value Ref Range   B Natriuretic Peptide 216.0 (H) 0.0 - 100.0 pg/mL    Comment: Performed at [facility], [address] [place], [place] [address]  Influenza panel by PCR (type A & B)     Status: None   Collection Time: 02/24/18  4:20 AM  Result Value Ref Range   Influenza A By PCR NEGATIVE NEGATIVE   Influenza B By PCR NEGATIVE NEGATIVE    Comment: (NOTE) The Xpert Xpress Flu assay is intended as an aid in the diagnosis of  influenza and should not be used as a sole basis for treatment.  This  assay is FDA approved  for nasopharyngeal swab specimens only. Nasal  washings and aspirates are unacceptable for Xpert Xpress Flu testing. Performed at [facility], [address] [place], [place] [address]   Troponin I - Add-On to previous collection     Status: None   Collection Time: 02/24/18  4:20 AM  Result Value Ref Range   Troponin I <0.03 <0.03 ng/mL    Comment: Performed at [facility], [address] [place], [place] [address]  Basic metabolic panel     Status: Abnormal   Collection Time: 02/24/18  4:21 AM  Result Value Ref Range   Sodium 135 135 - 145 mmol/L   Potassium 4.3 3.5 - 5.1 mmol/L   Chloride 84 (L) 98 - 111 mmol/L   CO2 46 (H) 22 - 32 mmol/L   Glucose, Bld 113 (H) 70 - 99 mg/dL   BUN 23 8 - 23 mg/dL   Creatinine, Ser 0.45 (L) 0.61 - 1.24 mg/dL   Calcium 9.1 8.9 - 10.3 mg/dL   GFR calc non Af Amer >60 >60 mL/min   GFR calc Af Amer >60 >60 mL/min    Comment: (NOTE) The eGFR has been calculated using the CKD EPI equation. This calculation has not been validated in all clinical situations. eGFR's persistently <60 mL/min signify possible Chronic Kidney Disease.    Anion gap 5 5 - 15    Comment: Performed at [facility], [address] [place], [place] [address]  TSH     Status: None   Collection Time: 02/24/18  4:21 AM  Result Value Ref Range   TSH 1.385 0.350 - 4.500 uIU/mL    Comment: Performed by a 3rd Generation assay with a functional sensitivity of <=0.01 uIU/mL. Performed at [facility], [address] [place], [place] [address]   Procalcitonin - Baseline     Status: None   Collection Time: 02/24/18  4:21 AM  Result Value Ref Range   Procalcitonin <0.10 ng/mL    Comment:        Interpretation: PCT (Procalcitonin) <= 0.5 ng/mL: Systemic infection (sepsis) is not likely. Local bacterial infection is possible. (NOTE)       Sepsis PCT Algorithm           Lower Respiratory Tract                                      Infection  PCT Algorithm    ----------------------------     ----------------------------         PCT <  0.25 ng/mL                PCT < 0.10 ng/mL         Strongly encourage             Strongly discourage   discontinuation of antibiotics    initiation of antibiotics    ----------------------------     -----------------------------       PCT 0.25 - 0.50 ng/mL            PCT 0.10 - 0.25 ng/mL               OR       >80% decrease in PCT            Discourage initiation of                                            antibiotics      Encourage discontinuation           of antibiotics    ----------------------------     -----------------------------         PCT >= 0.50 ng/mL              PCT 0.26 - 0.50 ng/mL               AND        <80% decrease in PCT             Encourage initiation of                                             antibiotics       Encourage continuation           of antibiotics    ----------------------------     -----------------------------        PCT >= 0.50 ng/mL                  PCT > 0.50 ng/mL               AND         increase in PCT                  Strongly encourage                                      initiation of antibiotics    Strongly encourage escalation           of antibiotics                                     -----------------------------                                           PCT <= 0.25 ng/mL  OR                                        > 80% decrease in PCT                                     Discontinue / Do not initiate                                             antibiotics Performed at [facility], [address] [place], [place] [address]   Blood gas, venous     Status: Abnormal   Collection Time: 02/24/18  5:17 AM  Result Value Ref Range   pH, Ven 7.37 7.250 - 7.430   pCO2, Ven 102 (HH) 44.0 - 60.0 mmHg    Comment: CRITICAL RESULT CALLED TO, READ BACK BY AND VERIFIED WITH:  BUTCH,  RN AT [id number] ON [id number]    Bicarbonate 59.0 (H) 20.0 - 28.0 mmol/L   Acid-Base Excess 27.3 (H) 0.0 - 2.0 mmol/L   Patient temperature 37.0    Sample type VENOUS     Comment: Performed at [facility], [address] [place], [place] [address]  Glucose, capillary     Status: None   Collection Time: 02/24/18  8:32 AM  Result Value Ref Range   Glucose-Capillary 90 70 - 99 mg/dL  MRSA PCR Screening     Status: None   Collection Time: 02/24/18  8:36 AM  Result Value Ref Range   MRSA by PCR NEGATIVE NEGATIVE    Comment:        The GeneXpert MRSA Assay (FDA approved for NASAL specimens only), is one component of a comprehensive MRSA colonization surveillance program. It is not intended to diagnose MRSA infection nor to guide or monitor treatment for MRSA infections. Performed at [facility], [address] [place], [place] [address]   Blood gas, arterial     Status: Abnormal   Collection Time: 02/24/18 11:42 AM  Result Value Ref Range   FIO2 0.35    Delivery systems NASAL CANNULA    pH, Arterial 7.42 7.350 - 7.450   pCO2 arterial 83 (HH) 32.0 - 48.0 mmHg    Comment: CRITICAL RESULT CALLED TO, READ BACK BY AND VERIFIED WITH: LEXI RN AT 1157 ON 02/24/18 BL    pO2, Arterial 40 (LL) 83.0 - 108.0 mmHg    Comment: CRITICAL RESULT CALLED TO, READ BACK BY AND VERIFIED WITH: LEXI RN AT 1157 ON 02/24/18 BL    Bicarbonate 53.8 (H) 20.0 - 28.0 mmol/L   Acid-Base Excess 24.3 (H) 0.0 - 2.0 mmol/L   O2 Saturation 76.0 %   Patient temperature 37.0    Collection site RIGHT RADIAL    Sample type ARTERIAL DRAW    Allens test (pass/fail) PASS PASS    Comment: Performed at [facility], [address] [place],  [address]  Glucose, capillary     Status: Abnormal   Collection Time: 02/24/18 12:08 PM  Result Value Ref Range   Glucose-Capillary 155 (H) 70 - 99 mg/dL  Troponin I - Now Then Q6H     Status: Abnormal   Collection Time: 02/24/18  3:13  PM   Result Value Ref Range   Troponin I 0.03 (HH) <0.03 ng/mL    Comment: CRITICAL RESULT CALLED TO, READ BACK BY AND VERIFIED WITH ALEXI  AYDLETT AT 1557 02/24/2018.  TFK Performed at [facility], [address] [place], [place] [address]   Glucose, capillary     Status: Abnormal   Collection Time: 02/24/18  4:16 PM  Result Value Ref Range   Glucose-Capillary 161 (H) 70 - 99 mg/dL  Troponin I - Now Then Q6H     Status: Abnormal   Collection Time: 02/24/18  8:11 PM  Result Value Ref Range   Troponin I 0.03 (HH) <0.03 ng/mL    Comment: CRITICAL VALUE NOTED. VALUE IS CONSISTENT WITH PREVIOUSLY REPORTED/CALLED VALUE.  TFK Performed at [facility], [address] [place], [place] [address]   Glucose, capillary     Status: Abnormal   Collection Time: 02/24/18  9:32 PM  Result Value Ref Range   Glucose-Capillary 129 (H) 70 - 99 mg/dL  Troponin I - Now Then Q6H     Status: None   Collection Time: 02/25/18  2:21 AM  Result Value Ref Range   Troponin I <0.03 <0.03 ng/mL    Comment: Performed at [facility], [address] [place], [place] [address]  CBC with Differential/Platelet     Status: Abnormal   Collection Time: 02/25/18  2:21 AM  Result Value Ref Range   WBC 7.1 4.0 - 10.5 K/uL   RBC 3.83 (L) 4.22 - 5.81 MIL/uL   Hemoglobin 11.3 (L) 13.0 - 17.0 g/dL   HCT 36.2 (L) 39.0 - 52.0 %   MCV 94.5 80.0 - 100.0 fL   MCH 29.5 26.0 - 34.0 pg   MCHC 31.2 30.0 - 36.0 g/dL   RDW 13.4 11.5 - 15.5 %   Platelets 190 150 - 400 K/uL   nRBC 0.0 0.0 - 0.2 %   Neutrophils Relative % 89 %   Neutro Abs 6.3 1.7 - 7.7 K/uL   Lymphocytes Relative 5 %   Lymphs Abs 0.4 (L) 0.7 - 4.0 K/uL   Monocytes Relative 6 %   Monocytes Absolute 0.4 0.1 - 1.0 K/uL   Eosinophils Relative 0 %   Eosinophils Absolute 0.0 0.0 - 0.5 K/uL   Basophils Relative 0 %   Basophils Absolute 0.0 0.0 - 0.1 K/uL   Immature Granulocytes 0 %   Abs Immature Granulocytes 0.03 0.00 - 0.07 K/uL     Comment: Performed at [facility], [address] [place],  [address]  Magnesium     Status: Abnormal   Collection Time: 02/25/18  2:21 AM  Result Value Ref Range   Magnesium 1.6 (L) 1.7 - 2.4 mg/dL    Comment: Performed at [facility], [address] [place],  [address]  Comprehensive metabolic panel     Status: Abnormal   Collection Time: 02/25/18  2:21 AM  Result Value Ref Range   Sodium 136 135 - 145 mmol/L   Potassium 3.6 3.5 - 5.1 mmol/L   Chloride 83 (L) 98 - 111 mmol/L   CO2 38 (H) 22 - 32 mmol/L   Glucose, Bld 135 (H) 70 - 99 mg/dL   BUN 23 8 - 23 mg/dL   Creatinine, Ser 0.65 0.61 - 1.24 mg/dL   Calcium 8.8 (L) 8.9 - 10.3 mg/dL   Total Protein 6.0 (L) 6.5 - 8.1 g/dL   Albumin 3.0 (L) 3.5 - 5.0 g/dL  AST 19 15 - 41 U/L   ALT 18 0 - 44 U/L   Alkaline Phosphatase 41 38 - 126 U/L   Total Bilirubin 0.9 0.3 - 1.2 mg/dL   GFR calc non Af Amer >60 >60 mL/min   GFR calc Af Amer >60 >60 mL/min    Comment: (NOTE) The eGFR has been calculated using the CKD EPI equation. This calculation has not been validated in all clinical situations. eGFR's persistently <60 mL/min signify possible Chronic Kidney Disease.    Anion gap 15 5 - 15    Comment: Performed at [facility], [address] [place], [place] [address]  Brain natriuretic peptide     Status: Abnormal   Collection Time: 02/25/18  2:25 AM  Result Value Ref Range   B Natriuretic Peptide 209.0 (H) 0.0 - 100.0 pg/mL    Comment: Performed at [facility], [address] [place], [place] [address]  Blood gas, arterial     Status: Abnormal   Collection Time: 02/25/18  4:18 AM  Result Value Ref Range   FIO2 0.36    Delivery systems NASAL CANNULA    pH, Arterial 7.36 7.350 - 7.450   pCO2 arterial 88 (HH) 32.0 - 48.0 mmHg    Comment: CRITICAL RESULT CALLED TO, READ BACK BY AND VERIFIED WITH:  CINDY VASQUEZ AT 0445 ON [id number]    pO2, Arterial 65 (L) 83.0 - 108.0 mmHg    Bicarbonate 49.7 (H) 20.0 - 28.0 mmol/L   Acid-Base Excess 19.8 (H) 0.0 - 2.0 mmol/L   O2 Saturation 91.6 %   Patient temperature 37.0    Collection site RIGHT RADIAL    Sample type ARTERIAL DRAW    Allens test (pass/fail) PASS PASS    Comment: Performed at [facility], [address] [place], [place] [address]  Glucose, capillary     Status: Abnormal   Collection Time: 02/25/18  7:45 AM  Result Value Ref Range   Glucose-Capillary 120 (H) 70 - 99 mg/dL  Blood gas, arterial     Status: Abnormal   Collection Time: 02/25/18 10:53 AM  Result Value Ref Range   FIO2 28.00    Delivery systems NASAL CANNULA    pH, Arterial 7.45 7.350 - 7.450   pCO2 arterial 70 (HH) 32.0 - 48.0 mmHg    Comment: RBV A AYDLETT RN AT 1058 BY N ELINSKI RRT ON [id number]    pO2, Arterial 44 (L) 83.0 - 108.0 mmHg   Bicarbonate 48.7 (H) 20.0 - 28.0 mmol/L   Acid-Base Excess 20.7 (H) 0.0 - 2.0 mmol/L   O2 Saturation 82.0 %   Patient temperature 37.0    Collection site LEFT RADIAL    Sample type ARTERIAL DRAW    Allens test (pass/fail) PASS PASS    Comment: Performed at [facility], [address] [place], [place] [address]  Glucose, capillary     Status: Abnormal   Collection Time: 02/25/18 11:51 AM  Result Value Ref Range   Glucose-Capillary 133 (H) 70 - 99 mg/dL  Culture, blood (single) w Reflex to ID Panel     Status: None   Collection Time: 02/25/18  2:24 PM  Result Value Ref Range   Specimen Description BLOOD BLOOD LEFT HAND    Special Requests      BOTTLES DRAWN AEROBIC AND ANAEROBIC Blood Culture adequate volume   Culture      NO GROWTH 5 DAYS Performed at [facility], [place], [place]  [address]    Report Status 03/02/2018 FINAL   Glucose, capillary     Status: Abnormal   Collection Time: 02/25/18  5:04 PM  Result Value Ref Range   Glucose-Capillary 144 (H) 70 - 99 mg/dL  Glucose, capillary     Status: Abnormal   Collection Time: 02/25/18   8:51 PM  Result Value Ref Range   Glucose-Capillary 114 (H) 70 - 99 mg/dL   Comment 1 Notify RN    Comment 2 Document in Chart   Basic metabolic panel     Status: Abnormal   Collection Time: 02/26/18  4:31 AM  Result Value Ref Range   Sodium 136 135 - 145 mmol/L   Potassium 4.0 3.5 - 5.1 mmol/L   Chloride 88 (L) 98 - 111 mmol/L   CO2 42 (H) 22 - 32 mmol/L   Glucose, Bld 138 (H) 70 - 99 mg/dL   BUN 31 (H) 8 - 23 mg/dL   Creatinine, Ser 0.72 0.61 - 1.24 mg/dL   Calcium 8.8 (L) 8.9 - 10.3 mg/dL   GFR calc non Af Amer >60 >60 mL/min   GFR calc Af Amer >60 >60 mL/min    Comment: (NOTE) The eGFR has been calculated using the CKD EPI equation. This calculation has not been validated in all clinical situations. eGFR's persistently <60 mL/min signify possible Chronic Kidney Disease.    Anion gap 6 5 - 15    Comment: Performed at [facility], [address] [place], [place] [address]  Magnesium     Status: None   Collection Time: 02/26/18  4:31 AM  Result Value Ref Range   Magnesium 2.2 1.7 - 2.4 mg/dL    Comment: Performed at [facility], [address] [place], [place] [address]  Glucose, capillary     Status: Abnormal   Collection Time: 02/26/18  8:25 AM  Result Value Ref Range   Glucose-Capillary 117 (H) 70 - 99 mg/dL   Comment 1 Notify RN   Glucose, capillary     Status: Abnormal   Collection Time: 02/26/18 11:52 AM  Result Value Ref Range   Glucose-Capillary 183 (H) 70 - 99 mg/dL   Comment 1 Notify RN   Glucose, capillary     Status: None   Collection Time: 02/26/18  4:26 PM  Result Value Ref Range   Glucose-Capillary 84 70 - 99 mg/dL  Glucose, capillary     Status: Abnormal   Collection Time: 02/26/18  9:12 PM  Result Value Ref Range   Glucose-Capillary 121 (H) 70 - 99 mg/dL   Comment 1 Notify RN    Comment 2 Document in Chart   Basic metabolic panel     Status: Abnormal   Collection Time: 02/27/18  5:10 AM  Result Value Ref Range   Sodium  132 (L) 135 - 145 mmol/L   Potassium 4.1 3.5 - 5.1 mmol/L   Chloride 85 (L) 98 - 111 mmol/L   CO2 39 (H) 22 - 32 mmol/L   Glucose, Bld 116 (H) 70 - 99 mg/dL   BUN 29 (H) 8 - 23 mg/dL   Creatinine, Ser 0.68 0.61 - 1.24 mg/dL   Calcium 8.6 (L) 8.9 - 10.3 mg/dL   GFR calc non Af Amer >60 >60 mL/min   GFR calc Af Amer >60 >60 mL/min    Comment: (NOTE) The eGFR has been calculated using the CKD EPI equation. This calculation has not been validated in all clinical situations. eGFR's persistently <60 mL/min signify  possible Chronic Kidney Disease.    Anion gap 8 5 - 15    Comment: Performed at [facility], [address] [place], [place] [address]  Magnesium     Status: None   Collection Time: 02/27/18  5:10 AM  Result Value Ref Range   Magnesium 2.0 1.7 - 2.4 mg/dL    Comment: Performed at [facility], [address] [place], [place] [address]  Glucose, capillary     Status: Abnormal   Collection Time: 02/27/18  7:24 AM  Result Value Ref Range   Glucose-Capillary 100 (H) 70 - 99 mg/dL   Comment 1 Notify RN    Comment 2 Document in Chart     Radiology: US Venous Img Lower Bilateral  Result Date: 02/24/2018 CLINICAL DATA:  Bilateral lower extremity edema.  Evaluate for DVT. EXAM: BILATERAL LOWER EXTREMITY VENOUS DOPPLER ULTRASOUND TECHNIQUE: Gray-scale sonography with graded compression, as well as color Doppler and duplex ultrasound were performed to evaluate the lower extremity deep venous systems from the level of the common femoral vein and including the common femoral, femoral, profunda femoral, popliteal and calf veins including the posterior tibial, peroneal and gastrocnemius veins when visible. The superficial great saphenous vein was also interrogated. Spectral Doppler was utilized to evaluate flow at rest and with distal augmentation maneuvers in the common femoral, femoral and popliteal veins. COMPARISON:  None. FINDINGS: RIGHT LOWER EXTREMITY Common  Femoral Vein: No evidence of thrombus. Normal compressibility, respiratory phasicity and response to augmentation. Saphenofemoral Junction: No evidence of thrombus. Normal compressibility and flow on color Doppler imaging. Profunda Femoral Vein: No evidence of thrombus. Normal compressibility and flow on color Doppler imaging. Femoral Vein: No evidence of thrombus. Normal compressibility, respiratory phasicity and response to augmentation. Popliteal Vein: No evidence of thrombus. Normal compressibility, respiratory phasicity and response to augmentation. Calf Veins: No evidence of thrombus. Normal compressibility and flow on color Doppler imaging. Superficial Great Saphenous Vein: No evidence of thrombus. Normal compressibility. Venous Reflux:  None. Other Findings:  None. LEFT LOWER EXTREMITY Common Femoral Vein: No evidence of thrombus. Normal compressibility, respiratory phasicity and response to augmentation. Saphenofemoral Junction: No evidence of thrombus. Normal compressibility and flow on color Doppler imaging. Profunda Femoral Vein: No evidence of thrombus. Normal compressibility and flow on color Doppler imaging. Femoral Vein: No evidence of thrombus. Normal compressibility, respiratory phasicity and response to augmentation. Popliteal Vein: No evidence of thrombus. Normal compressibility, respiratory phasicity and response to augmentation. Calf Veins: No evidence of thrombus. Normal compressibility and flow on color Doppler imaging. Superficial Great Saphenous Vein: No evidence of thrombus. Normal compressibility. Venous Reflux:  None. Other Findings:  None. IMPRESSION: No evidence of DVT within either lower extremity. Electronically Signed   By: Sandi Mariscal M.D.   On: 02/24/2018 16:34   Dg Chest Port 1 View  Result Date: 02/25/2018 CLINICAL DATA:  Hx of COPD, CHF, CAD. Former smoker 2012 Now with increasing SOB, pt on CPAP EXAM: PORTABLE CHEST 1 VIEW COMPARISON:  02/24/2018 FINDINGS: There is stable  elevation of the RIGHT hemidiaphragm. Patient is rotated towards the LEFT. The heart size is probably normal. There is persistent opacity in the LEFT lung base partially obscuring the hemidiaphragm. No pulmonary edema. Remote rib fractures. IMPRESSION: Persistent LEFT LOWER lobe atelectasis or infiltrate. Electronically Signed   By: Nolon Nations M.D.   On: 02/25/2018 09:41   Dg Chest Port 1 View  Result Date: 02/24/2018 CLINICAL DATA:  Acute onset of dyspnea. EXAM: PORTABLE CHEST 1 VIEW  COMPARISON:  Chest radiograph performed 02/01/2018 FINDINGS: The lungs are hypoexpanded. A small left pleural effusion is noted. There is elevation of the right hemidiaphragm. Vascular crowding is noted. Left basilar airspace opacification could reflect pneumonia, depending on the patient's symptoms. There is no evidence of pneumothorax. The cardiomediastinal silhouette is within normal limits. No acute osseous abnormalities are seen. Cervical spinal fusion hardware is partially imaged. IMPRESSION: Lungs hypoexpanded. Small left pleural effusion noted. Left basilar airspace opacification could reflect pneumonia, depending on the patient's symptoms. Electronically Signed   By: Garald Balding M.D.   On: 02/24/2018 04:39    No results found.  US Venous Img Lower Bilateral  Result Date: 02/24/2018 CLINICAL DATA:  Bilateral lower extremity edema.  Evaluate for DVT. EXAM: BILATERAL LOWER EXTREMITY VENOUS DOPPLER ULTRASOUND TECHNIQUE: Gray-scale sonography with graded compression, as well as color Doppler and duplex ultrasound were performed to evaluate the lower extremity deep venous systems from the level of the common femoral vein and including the common femoral, femoral, profunda femoral, popliteal and calf veins including the posterior tibial, peroneal and gastrocnemius veins when visible. The superficial great saphenous vein was also interrogated. Spectral Doppler was utilized to evaluate flow at rest and with distal  augmentation maneuvers in the common femoral, femoral and popliteal veins. COMPARISON:  None. FINDINGS: RIGHT LOWER EXTREMITY Common Femoral Vein: No evidence of thrombus. Normal compressibility, respiratory phasicity and response to augmentation. Saphenofemoral Junction: No evidence of thrombus. Normal compressibility and flow on color Doppler imaging. Profunda Femoral Vein: No evidence of thrombus. Normal compressibility and flow on color Doppler imaging. Femoral Vein: No evidence of thrombus. Normal compressibility, respiratory phasicity and response to augmentation. Popliteal Vein: No evidence of thrombus. Normal compressibility, respiratory phasicity and response to augmentation. Calf Veins: No evidence of thrombus. Normal compressibility and flow on color Doppler imaging. Superficial Great Saphenous Vein: No evidence of thrombus. Normal compressibility. Venous Reflux:  None. Other Findings:  None. LEFT LOWER EXTREMITY Common Femoral Vein: No evidence of thrombus. Normal compressibility, respiratory phasicity and response to augmentation. Saphenofemoral Junction: No evidence of thrombus. Normal compressibility and flow on color Doppler imaging. Profunda Femoral Vein: No evidence of thrombus. Normal compressibility and flow on color Doppler imaging. Femoral Vein: No evidence of thrombus. Normal compressibility, respiratory phasicity and response to augmentation. Popliteal Vein: No evidence of thrombus. Normal compressibility, respiratory phasicity and response to augmentation. Calf Veins: No evidence of thrombus. Normal compressibility and flow on color Doppler imaging. Superficial Great Saphenous Vein: No evidence of thrombus. Normal compressibility. Venous Reflux:  None. Other Findings:  None. IMPRESSION: No evidence of DVT within either lower extremity. Electronically Signed   By: Sandi Mariscal M.D.   On: 02/24/2018 16:34   Dg Chest Port 1 View  Result Date: 02/25/2018 CLINICAL DATA:  Hx of COPD, CHF, CAD.  Former smoker 2012 Now with increasing SOB, pt on CPAP EXAM: PORTABLE CHEST 1 VIEW COMPARISON:  02/24/2018 FINDINGS: There is stable elevation of the RIGHT hemidiaphragm. Patient is rotated towards the LEFT. The heart size is probably normal. There is persistent opacity in the LEFT lung base partially obscuring the hemidiaphragm. No pulmonary edema. Remote rib fractures. IMPRESSION: Persistent LEFT LOWER lobe atelectasis or infiltrate. Electronically Signed   By: Nolon Nations M.D.   On: 02/25/2018 09:41   Dg Chest Port 1 View  Result Date: 02/24/2018 CLINICAL DATA:  Acute onset of dyspnea. EXAM: PORTABLE CHEST 1 VIEW COMPARISON:  Chest radiograph performed 02/01/2018 FINDINGS: The lungs are hypoexpanded. A small left pleural effusion  is noted. There is elevation of the right hemidiaphragm. Vascular crowding is noted. Left basilar airspace opacification could reflect pneumonia, depending on the patient's symptoms. There is no evidence of pneumothorax. The cardiomediastinal silhouette is within normal limits. No acute osseous abnormalities are seen. Cervical spinal fusion hardware is partially imaged. IMPRESSION: Lungs hypoexpanded. Small left pleural effusion noted. Left basilar airspace opacification could reflect pneumonia, depending on the patient's symptoms. Electronically Signed   By: Garald Balding M.D.   On: 02/24/2018 04:39      Assessment and Plan: Patient Active Problem List   Diagnosis Date Noted  . HCAP (healthcare-associated pneumonia) 02/24/2018  . Chronic diastolic heart failure ([place]) 02/11/2018  . Atrial fibrillation ([place]) 02/11/2018  . Lymphedema 02/11/2018  . Protein-calorie malnutrition, severe 02/03/2018  . Pressure injury of skin 02/02/2018  . Primary generalized (osteo)arthritis 04/19/2017  . Weakness 04/19/2017  . Difficulty in walking, not elsewhere classified 04/19/2017  . Dependence on supplemental oxygen 04/19/2017  . Edema, unspecified 04/19/2017  . Cor  pulmonale (chronic) ([place]) 04/19/2017  . Chronic pain syndrome 04/19/2017  . Hypercalcemia 04/19/2017  . Occlusion and stenosis of bilateral carotid arteries 04/19/2017  . Tremor, unspecified 04/19/2017  . Nicotine dependence, cigarettes, in remission 04/19/2017  . Allergic rhinitis, unspecified 04/19/2017  . Emphysema, unspecified ([place]) 04/19/2017  . Mixed hyperlipidemia 04/19/2017  . Generalized anxiety disorder 04/19/2017  . Cough 04/19/2017  . Essential hypertension 10/22/2016  . COPD exacerbation ([place]) 06/17/2016    1. Weakness Patient has increasing upper extremity as well as lower extremity weakness and would benefit from a motorized wheelchair.  Orders for motorized wheelchair will be placed at this time.  2. COPD exacerbation ([place]) Patient was recently hospitalized for COPD exacerbation and reports he is doing better at this time.  3. Chronic respiratory failure with hypoxia and hypercapnia (HCC) Patient continues to use oxygen to 4 hours a day.  4. Acute on chronic right-sided congestive heart failure (HCC) Stable, continue following up with cardiology as discussed.  5. Dependence on supplemental oxygen Continue using continuous oxygen via nasal cannula.  Discussed the importance of not going without his oxygen.  General Counseling: I have discussed the findings of the evaluation and examination with Theodoro Doing.  I have also discussed any further diagnostic evaluation thatmay be needed or ordered today. Naoki verbalizes understanding of the findings of todays visit. We also reviewed his medications today and discussed drug interactions and side effects including but not limited excessive drowsiness and altered mental states. We also discussed that there is always a risk not just to him but also people around him. he has been encouraged to call the office with any questions or concerns that should arise related to todays visit.    Time spent: 30 This patient was seen by Orson Gear AGNP-C in Collaboration with Dr. Devona Konig as a part of collaborative care agreement.   I have personally obtained a history, examined the patient, evaluated laboratory and imaging results, formulated the assessment and plan and placed orders.    Allyne Gee, MD [facility] Pulmonary and Critical Care Sleep medicine

## 2018-03-09 NOTE — Patient Instructions (Signed)

## 2018-03-10 DIAGNOSIS — J441 Chronic obstructive pulmonary disease with (acute) exacerbation: Secondary | ICD-10-CM | POA: Diagnosis not present

## 2018-03-13 DIAGNOSIS — J9612 Chronic respiratory failure with hypercapnia: Secondary | ICD-10-CM | POA: Diagnosis not present

## 2018-03-13 DIAGNOSIS — I110 Hypertensive heart disease with heart failure: Secondary | ICD-10-CM | POA: Diagnosis not present

## 2018-03-13 DIAGNOSIS — J441 Chronic obstructive pulmonary disease with (acute) exacerbation: Secondary | ICD-10-CM | POA: Diagnosis not present

## 2018-03-13 DIAGNOSIS — E43 Unspecified severe protein-calorie malnutrition: Secondary | ICD-10-CM | POA: Diagnosis not present

## 2018-03-13 DIAGNOSIS — I50813 Acute on chronic right heart failure: Secondary | ICD-10-CM | POA: Diagnosis not present

## 2018-03-13 DIAGNOSIS — I4891 Unspecified atrial fibrillation: Secondary | ICD-10-CM | POA: Diagnosis not present

## 2018-03-13 DIAGNOSIS — J9611 Chronic respiratory failure with hypoxia: Secondary | ICD-10-CM | POA: Diagnosis not present

## 2018-03-13 DIAGNOSIS — Z9981 Dependence on supplemental oxygen: Secondary | ICD-10-CM | POA: Diagnosis not present

## 2018-03-14 ENCOUNTER — Other Ambulatory Visit: Payer: Self-pay | Admitting: Adult Health

## 2018-03-14 DIAGNOSIS — R32 Unspecified urinary incontinence: Secondary | ICD-10-CM | POA: Diagnosis not present

## 2018-03-15 DIAGNOSIS — Z9981 Dependence on supplemental oxygen: Secondary | ICD-10-CM | POA: Diagnosis not present

## 2018-03-15 DIAGNOSIS — I50813 Acute on chronic right heart failure: Secondary | ICD-10-CM | POA: Diagnosis not present

## 2018-03-15 DIAGNOSIS — I110 Hypertensive heart disease with heart failure: Secondary | ICD-10-CM | POA: Diagnosis not present

## 2018-03-15 DIAGNOSIS — J441 Chronic obstructive pulmonary disease with (acute) exacerbation: Secondary | ICD-10-CM | POA: Diagnosis not present

## 2018-03-15 DIAGNOSIS — E43 Unspecified severe protein-calorie malnutrition: Secondary | ICD-10-CM | POA: Diagnosis not present

## 2018-03-15 DIAGNOSIS — J9611 Chronic respiratory failure with hypoxia: Secondary | ICD-10-CM | POA: Diagnosis not present

## 2018-03-15 DIAGNOSIS — J9612 Chronic respiratory failure with hypercapnia: Secondary | ICD-10-CM | POA: Diagnosis not present

## 2018-03-15 DIAGNOSIS — I4891 Unspecified atrial fibrillation: Secondary | ICD-10-CM | POA: Diagnosis not present

## 2018-03-17 ENCOUNTER — Inpatient Hospital Stay
Admission: EM | Admit: 2018-03-17 | Discharge: 2018-03-20 | DRG: 291 | Disposition: A | Discharge status: Hospice | Payer: Medicare HMO | Attending: Internal Medicine | Admitting: Internal Medicine

## 2018-03-17 ENCOUNTER — Encounter: Payer: Self-pay | Admitting: Emergency Medicine

## 2018-03-17 ENCOUNTER — Other Ambulatory Visit: Payer: Self-pay

## 2018-03-17 ENCOUNTER — Emergency Department: Payer: Medicare HMO

## 2018-03-17 DIAGNOSIS — E785 Hyperlipidemia, unspecified: Secondary | ICD-10-CM | POA: Diagnosis present

## 2018-03-17 DIAGNOSIS — R000 Tachycardia, unspecified: Secondary | ICD-10-CM | POA: Diagnosis not present

## 2018-03-17 DIAGNOSIS — Z87891 Personal history of nicotine dependence: Secondary | ICD-10-CM | POA: Diagnosis not present

## 2018-03-17 DIAGNOSIS — R0902 Hypoxemia: Secondary | ICD-10-CM

## 2018-03-17 DIAGNOSIS — R0602 Shortness of breath: Secondary | ICD-10-CM | POA: Diagnosis not present

## 2018-03-17 DIAGNOSIS — Z8673 Personal history of transient ischemic attack (TIA), and cerebral infarction without residual deficits: Secondary | ICD-10-CM

## 2018-03-17 DIAGNOSIS — M1990 Unspecified osteoarthritis, unspecified site: Secondary | ICD-10-CM | POA: Diagnosis present

## 2018-03-17 DIAGNOSIS — Z7951 Long term (current) use of inhaled steroids: Secondary | ICD-10-CM

## 2018-03-17 DIAGNOSIS — Z7401 Bed confinement status: Secondary | ICD-10-CM | POA: Diagnosis not present

## 2018-03-17 DIAGNOSIS — I110 Hypertensive heart disease with heart failure: Principal | ICD-10-CM | POA: Diagnosis present

## 2018-03-17 DIAGNOSIS — I4891 Unspecified atrial fibrillation: Secondary | ICD-10-CM | POA: Diagnosis not present

## 2018-03-17 DIAGNOSIS — I5033 Acute on chronic diastolic (congestive) heart failure: Secondary | ICD-10-CM | POA: Diagnosis present

## 2018-03-17 DIAGNOSIS — R918 Other nonspecific abnormal finding of lung field: Secondary | ICD-10-CM | POA: Diagnosis not present

## 2018-03-17 DIAGNOSIS — J449 Chronic obstructive pulmonary disease, unspecified: Secondary | ICD-10-CM | POA: Diagnosis present

## 2018-03-17 DIAGNOSIS — I2510 Atherosclerotic heart disease of native coronary artery without angina pectoris: Secondary | ICD-10-CM | POA: Diagnosis not present

## 2018-03-17 DIAGNOSIS — Z79899 Other long term (current) drug therapy: Secondary | ICD-10-CM | POA: Diagnosis not present

## 2018-03-17 DIAGNOSIS — Z7901 Long term (current) use of anticoagulants: Secondary | ICD-10-CM | POA: Diagnosis not present

## 2018-03-17 DIAGNOSIS — I480 Paroxysmal atrial fibrillation: Secondary | ICD-10-CM | POA: Diagnosis not present

## 2018-03-17 DIAGNOSIS — I509 Heart failure, unspecified: Secondary | ICD-10-CM

## 2018-03-17 DIAGNOSIS — Z66 Do not resuscitate: Secondary | ICD-10-CM | POA: Diagnosis present

## 2018-03-17 DIAGNOSIS — L89322 Pressure ulcer of left buttock, stage 2: Secondary | ICD-10-CM | POA: Diagnosis not present

## 2018-03-17 DIAGNOSIS — R942 Abnormal results of pulmonary function studies: Secondary | ICD-10-CM | POA: Diagnosis not present

## 2018-03-17 DIAGNOSIS — J189 Pneumonia, unspecified organism: Secondary | ICD-10-CM

## 2018-03-17 DIAGNOSIS — Z9981 Dependence on supplemental oxygen: Secondary | ICD-10-CM

## 2018-03-17 DIAGNOSIS — J9621 Acute and chronic respiratory failure with hypoxia: Secondary | ICD-10-CM | POA: Diagnosis not present

## 2018-03-17 DIAGNOSIS — R0689 Other abnormalities of breathing: Secondary | ICD-10-CM | POA: Diagnosis not present

## 2018-03-17 DIAGNOSIS — R001 Bradycardia, unspecified: Secondary | ICD-10-CM | POA: Diagnosis not present

## 2018-03-17 DIAGNOSIS — L8990 Pressure ulcer of unspecified site, unspecified stage: Secondary | ICD-10-CM | POA: Diagnosis not present

## 2018-03-17 LAB — CBC
HCT: 37.1 % — ABNORMAL LOW (ref 39.0–52.0)
Hemoglobin: 11.3 g/dL — ABNORMAL LOW (ref 13.0–17.0)
MCH: 29.7 pg (ref 26.0–34.0)
MCHC: 30.5 g/dL (ref 30.0–36.0)
MCV: 97.6 fL (ref 80.0–100.0)
Platelets: 194 10*3/uL (ref 150–400)
RBC: 3.8 MIL/uL — ABNORMAL LOW (ref 4.22–5.81)
RDW: 13.4 % (ref 11.5–15.5)
WBC: 8.8 10*3/uL (ref 4.0–10.5)
nRBC: 0 % (ref 0.0–0.2)

## 2018-03-17 LAB — BRAIN NATRIURETIC PEPTIDE: B Natriuretic Peptide: 183 pg/mL — ABNORMAL HIGH (ref 0.0–100.0)

## 2018-03-17 LAB — BASIC METABOLIC PANEL
Anion gap: 5 (ref 5–15)
BUN: 22 mg/dL (ref 8–23)
CO2: 43 mmol/L — ABNORMAL HIGH (ref 22–32)
Calcium: 9.1 mg/dL (ref 8.9–10.3)
Chloride: 87 mmol/L — ABNORMAL LOW (ref 98–111)
Creatinine, Ser: 0.55 mg/dL — ABNORMAL LOW (ref 0.61–1.24)
GFR calc Af Amer: >60 mL/min (ref >60)
GFR calc non Af Amer: >60 mL/min (ref >60)
Glucose, Bld: 144 mg/dL — ABNORMAL HIGH (ref 70–99)
Potassium: 4.4 mmol/L (ref 3.5–5.1)
Sodium: 135 mmol/L (ref 135–145)

## 2018-03-17 LAB — TROPONIN I
Troponin I: <0.03 ng/mL (ref <0.03)
Troponin I: <0.03 ng/mL (ref <0.03)
Troponin I: <0.03 ng/mL (ref <0.03)

## 2018-03-17 LAB — MAGNESIUM: Magnesium: 1.7 mg/dL (ref 1.7–2.4)

## 2018-03-17 LAB — PROTIME-INR
INR: 1.13
PROTHROMBIN TIME: 14.4 s (ref 11.4–15.2)

## 2018-03-17 LAB — TSH: TSH: 1.777 u[IU]/mL (ref 0.350–4.500)

## 2018-03-17 MED ORDER — SODIUM CHLORIDE 0.9 % IV SOLN
2.0000 g | Freq: Once | INTRAVENOUS | Status: AC
  Administered 2018-03-17: 2 g via INTRAVENOUS
  Filled 2018-03-17: qty 2

## 2018-03-17 MED ORDER — FUROSEMIDE 10 MG/ML IJ SOLN
40.0000 mg | Freq: Two times a day (BID) | INTRAMUSCULAR | Status: DC
  Administered 2018-03-17 – 2018-03-18 (×3): 40 mg via INTRAVENOUS
  Filled 2018-03-17 (×3): qty 4

## 2018-03-17 MED ORDER — LORATADINE 10 MG PO TABS
10.0000 mg | ORAL_TABLET | Freq: Every day | ORAL | Status: DC
  Administered 2018-03-17 – 2018-03-20 (×4): 10 mg via ORAL
  Filled 2018-03-17 (×4): qty 1

## 2018-03-17 MED ORDER — FUROSEMIDE 10 MG/ML IJ SOLN
60.0000 mg | Freq: Once | INTRAMUSCULAR | Status: AC
  Administered 2018-03-17: 60 mg via INTRAVENOUS
  Filled 2018-03-17: qty 8

## 2018-03-17 MED ORDER — CARVEDILOL 3.125 MG PO TABS
3.1250 mg | ORAL_TABLET | Freq: Every day | ORAL | Status: DC
  Administered 2018-03-18 – 2018-03-20 (×3): 3.125 mg via ORAL
  Filled 2018-03-17 (×4): qty 1

## 2018-03-17 MED ORDER — APIXABAN 5 MG PO TABS
5.0000 mg | ORAL_TABLET | Freq: Two times a day (BID) | ORAL | Status: DC
  Administered 2018-03-17 – 2018-03-20 (×7): 5 mg via ORAL
  Filled 2018-03-17 (×7): qty 1

## 2018-03-17 MED ORDER — PRAMIPEXOLE DIHYDROCHLORIDE 0.25 MG PO TABS
0.5000 mg | ORAL_TABLET | Freq: Three times a day (TID) | ORAL | Status: DC
  Administered 2018-03-17 – 2018-03-20 (×9): 0.5 mg via ORAL
  Filled 2018-03-17 (×11): qty 2

## 2018-03-17 MED ORDER — LISINOPRIL 5 MG PO TABS
2.5000 mg | ORAL_TABLET | Freq: Every day | ORAL | Status: DC
  Administered 2018-03-18 – 2018-03-20 (×3): 2.5 mg via ORAL
  Filled 2018-03-17 (×3): qty 1

## 2018-03-17 MED ORDER — POTASSIUM CHLORIDE CRYS ER 20 MEQ PO TBCR
20.0000 meq | EXTENDED_RELEASE_TABLET | Freq: Every day | ORAL | Status: DC
  Administered 2018-03-17 – 2018-03-20 (×4): 20 meq via ORAL
  Filled 2018-03-17 (×4): qty 1

## 2018-03-17 MED ORDER — ACETAMINOPHEN 325 MG PO TABS: 650.0000 mg | ORAL_TABLET | Freq: Four times a day (QID) | ORAL | Status: DC | PRN

## 2018-03-17 MED ORDER — VANCOMYCIN HCL IN DEXTROSE 1-5 GM/200ML-% IV SOLN
1000.0000 mg | Freq: Once | INTRAVENOUS | Status: AC
  Administered 2018-03-17: 1000 mg via INTRAVENOUS
  Filled 2018-03-17: qty 200

## 2018-03-17 MED ORDER — VITAMIN D3 25 MCG (1000 UNIT) PO TABS
1000.0000 [IU] | ORAL_TABLET | Freq: Every day | ORAL | Status: DC
  Administered 2018-03-17 – 2018-03-20 (×4): 1000 [IU] via ORAL
  Filled 2018-03-17 (×5): qty 1

## 2018-03-17 MED ORDER — FLUTICASONE PROPIONATE 50 MCG/ACT NA SUSP
2.0000 | Freq: Two times a day (BID) | NASAL | Status: DC
  Administered 2018-03-17 – 2018-03-20 (×6): 2 via NASAL
  Filled 2018-03-17 (×2): qty 16

## 2018-03-17 MED ORDER — BOOST PO LIQD
237.0000 mL | Freq: Three times a day (TID) | ORAL | Status: DC
  Administered 2018-03-18 – 2018-03-20 (×4): 237 mL via ORAL
  Filled 2018-03-17: qty 237

## 2018-03-17 MED ORDER — MOMETASONE FURO-FORMOTEROL FUM 200-5 MCG/ACT IN AERO
2.0000 | INHALATION_SPRAY | Freq: Two times a day (BID) | RESPIRATORY_TRACT | Status: DC
  Administered 2018-03-17 – 2018-03-20 (×6): 2 via RESPIRATORY_TRACT
  Filled 2018-03-17 (×2): qty 8.8

## 2018-03-17 NOTE — ED Triage Notes (Signed)
Pt arrived to ED via EMS from home with c/o SOB since waking this AM. Pt was seen in ED x1 week ago, sent home with bipap and has refused to use at the home. Pt on 4L chronic. Pt A&O x4 on arrival with wet lung sounds.

## 2018-03-17 NOTE — Consult Note (Addendum)
WOC Nurse wound consult note Reason for Consult: Consult requested for pressure injury. Pt is very emaciated and incontinent of urine. Wound type: Left buttock with stage 2 pressure injury; .5X.5X.1cm, red and moist Right buttock with stage 1 pressure injury; 3X3cm red and nonblanching Pressure Injury POA: Yes Dressing procedure/placement/frequency: Foam dressing to protect and promote healing.  No family at bedside to discuss plan of care. Please re-consult if further assistance is needed.  Thank-you,  Farren Landa MSN, RN, CWCammie McgeeN, [place] Point[place]CN-AP, CNS [phone]

## 2018-03-17 NOTE — Progress Notes (Signed)
Attempted to call patients sister, [name], but was unable to make contact with her.

## 2018-03-17 NOTE — ED Provider Notes (Addendum)
[facility] Emergency Department Provider Note   ____________________________________________   I have reviewed the triage vital signs and the nursing notes.   HISTORY  Chief Complaint Left foot pain  History limited by and level 5 caveat due to: Poor historian, most history obtained from sister  HPI Scott Richard is a 82 y.o. male who presents to the emergency department today with main complaint of left foot pain. However in discussion with his sister he is really here because of shortness of breath. She states that his breathing started getting worse yesterday. Normally on 2L at home but they have had to increase his oxygen. He had associated cough productive of white/clear phlegm. They have not noticed any fevers. The patient has swollen lower extremities of unclear length of time. In terms of the foot pain the patient states that it is located in his left heel, he states that he has been evaluated for it in the past.   Per medical record review patient has a history of COPD, recent hospitalization for COPD, atrial fibrillation  Past Medical History:  Diagnosis Date  . Alcohol use   . Arthritis   . Atrial fibrillation (HCC)   . CHF (congestive heart failure) (HCC)   . COPD (chronic obstructive pulmonary disease) (HCC)   . Coronary artery disease   . Hyperlipidemia   . Hypertension   . Stroke [facility])     Patient Active Problem List   Diagnosis Date Noted  . HCAP (healthcare-associated pneumonia) 02/24/2018  . Chronic diastolic heart failure (HCC) 02/11/2018  . Atrial fibrillation (HCC) 02/11/2018  . Lymphedema 02/11/2018  . Protein-calorie malnutrition, severe 02/03/2018  . Pressure injury of skin 02/02/2018  . Primary generalized (osteo)arthritis 04/19/2017  . Weakness 04/19/2017  . Difficulty in walking, not elsewhere classified 04/19/2017  . Dependence on supplemental oxygen 04/19/2017  . Edema, unspecified 04/19/2017  . Cor pulmonale  (chronic) (HCC) 04/19/2017  . Chronic pain syndrome 04/19/2017  . Hypercalcemia 04/19/2017  . Occlusion and stenosis of bilateral carotid arteries 04/19/2017  . Tremor, unspecified 04/19/2017  . Nicotine dependence, cigarettes, in remission 04/19/2017  . Allergic rhinitis, unspecified 04/19/2017  . Emphysema, unspecified (HCC) 04/19/2017  . Mixed hyperlipidemia 04/19/2017  . Generalized anxiety disorder 04/19/2017  . Cough 04/19/2017  . Essential hypertension 10/22/2016  . COPD exacerbation (HCC) 06/17/2016    Past Surgical History:  Procedure Laterality Date  . NECK SURGERY    . TONSILLECTOMY      Prior to Admission medications   Medication Sig Start Date End Date Taking? Authorizing Provider  acetaminophen (TYLENOL) 325 MG tablet Take 2 tablets (650 mg total) by mouth every 6 (six) hours as needed for mild pain or headache. 02/04/18   Gouru, Deanna Artis, MD  albuterol (VENTOLIN HFA) 108 (90 Base) MCG/ACT inhaler Inhale 1-2 puffs into the lungs every 6 (six) hours as needed for wheezing or shortness of breath. 03/09/18   Johnna Acosta, NP  apixaban (ELIQUIS) 5 MG TABS tablet Take 1 tablet (5 mg total) by mouth 2 (two) times daily. 02/27/18 03/29/18  Ihor Austin, MD  carvedilol (COREG) 3.125 MG tablet Take 1 tablet (3.125 mg total) by mouth 2 (two) times daily with a meal. 03/09/18   Boscia, Kathlynn Grate, NP  cetirizine (ZYRTEC) 10 MG tablet Take 10 mg by mouth daily.    [provider]  Cholecalciferol (VITAMIN D-1000 MAX ST) 1000 units tablet Take 1 tablet by mouth daily.    [provider]  fluticasone (FLONASE) 50  MCG/ACT nasal spray Place 2 sprays into both nostrils daily. Patient taking differently: Place 2 sprays into both nostrils 2 (two) times daily.  01/17/18   Carlean JewsBoscia, Heather E, NP  furosemide (LASIX) 20 MG tablet Take 2 tablets (40 mg total) by mouth daily. 02/27/18 03/29/18  Ihor AustinPyreddy, Pavan, MD  furosemide (LASIX) 20 MG tablet Take by mouth. 02/23/18    [provider]  lactose free nutrition (BOOST) LIQD Take 237 mLs by mouth 3 (three) times daily with meals.    [provider]  lisinopril (PRINIVIL,ZESTRIL) 2.5 MG tablet Take 1 tablet (2.5 mg total) by mouth daily. 03/09/18   Carlean JewsBoscia, Heather E, NP  Multiple Vitamin (MULTIVITAMIN) capsule Take by mouth.    [provider]  OXYGEN Inhale 2 L into the lungs continuous.    [provider]  potassium chloride 20 MEQ TBCR Take 20 mEq by mouth daily. 02/27/18 03/29/18  Ihor AustinPyreddy, Pavan, MD  pramipexole (MIRAPEX) 0.5 MG tablet Take 1 tablet (0.5 mg total) by mouth 3 (three) times daily. 02/28/18   Johnna AcostaScarboro, Adam J, NP  predniSONE (DELTASONE) 10 MG tablet Take 1 tablet (10 mg total) by mouth daily. Label  & dispense according to the schedule below.  6 tablets day one, then 5 table day 2, then 4 tablets day 3, then 3 tablets day 4, 2 tablets day 5, then 1 tablet day 6, then stop Patient not taking: Reported on 03/09/2018 02/27/18   Ihor AustinPyreddy, Pavan, MD  SYMBICORT 160-4.5 MCG/ACT inhaler Inhale 2 puffs into the lungs 2 (two) times daily. 02/28/18   Johnna AcostaScarboro, Adam J, NP    Allergies Patient has no known allergies.  Family History  Problem Relation Age of Onset  . Prostate cancer Neg Hx   . Bladder Cancer Neg Hx   . Kidney cancer Neg Hx     Social History Social History   Tobacco Use  . Smoking status: Former Smoker    Last attempt to quit: 2012    Years since quitting: 7.9  . Smokeless tobacco: Never Used  Substance Use Topics  . Alcohol use: No  . Drug use: No    Review of Systems Constitutional: No fever/chills Eyes: No visual changes. ENT: No sore throat. Cardiovascular: Denies chest pain. Respiratory: Positive for shortness of breath. Positive for cough. Gastrointestinal: No abdominal pain.  No nausea, no vomiting.  No diarrhea.   Genitourinary: Negative for dysuria. Musculoskeletal: Positive for left heel pain.  Skin: Negative for  rash. Neurological: Negative for headaches, focal weakness or numbness.  ____________________________________________   PHYSICAL EXAM:  VITAL SIGNS: ED Triage Vitals  Enc Vitals Group     BP 03/17/18 0701 128/83     Pulse Rate 03/17/18 0701 79     Resp 03/17/18 0701 (!) 26     Temp --      Temp src --      SpO2 03/17/18 0701 95 %     Weight 03/17/18 0658 157 lb 6.5 oz (71.4 kg)   Constitutional: Awake and alert. Not completely oriented to recent health issues.  Eyes: Conjunctivae are normal.  ENT      Head: Normocephalic and atraumatic.      Nose: No congestion/rhinnorhea.      Mouth/Throat: Mucous membranes are moist.      Neck: No stridor. Hematological/Lymphatic/Immunilogical: No cervical lymphadenopathy. Cardiovascular: Normal rate, regular rhythm.  No murmurs, rubs, or gallops.  Respiratory: Slightly increased work of breathing, crackles in lower lungs.  Gastrointestinal: Soft and non tender. No  rebound. No guarding.  Genitourinary: Deferred Musculoskeletal: 2+ bilateral edema.  Neurologic:  Normal speech and language. No gross focal neurologic deficits are appreciated.  Skin:  Skin is warm, dry and intact. No rash noted. Psychiatric: Mood and affect are normal. Speech and behavior are normal. Patient exhibits appropriate insight and judgment.  ____________________________________________    LABS (pertinent positives/negatives)  CBC wbc 8.8, hgb 11.3, plt 194 Trop <0.03 BMP na 135, k 4.4, glu 144, cr 0.55 ____________________________________________   EKG  I, Phineas Semen, attending physician, personally viewed and interpreted this EKG  EKG Time: 0722 Rate: 70 Rhythm: atrial fibrillation Axis: normal Intervals: qtc 429 QRS: narrow ST changes: no st elevation Impression: abnormal ekg   ____________________________________________    RADIOLOGY  CXR Increased opacity, concern for interstitial edema and/or atypical  infection  ____________________________________________   PROCEDURES  Procedures  CRITICAL CARE Performed by: Phineas Semen   Total critical care time: 30 minutes  Critical care time was exclusive of separately billable procedures and treating other patients.  Critical care was necessary to treat or prevent imminent or life-threatening deterioration.  Critical care was time spent personally by me on the following activities: development of treatment plan with patient and/or surrogate as well as nursing, discussions with consultants, evaluation of patient's response to treatment, examination of patient, obtaining history from patient or surrogate, ordering and performing treatments and interventions, ordering and review of laboratory studies, ordering and review of radiographic studies, pulse oximetry and re-evaluation of patient's condition.  ____________________________________________   INITIAL IMPRESSION / ASSESSMENT AND PLAN / ED COURSE  Pertinent labs & imaging results that were available during my care of the patient were reviewed by me and considered in my medical decision making (see chart for details).   Patient presented to the emergency department today because of family concern for worsening shortness of breath. ddx would include COPD, CHF, PNA, PTX, ACS, PE, anemia amongst other etiologies. CXR is concerning for worsening infiltrate. Given recent hospitalization concern for HCAP. Also concern for CHF/edema given lower extremity edema. Will start abx as well as give lasix. Will plan on admission.  ____________________________________________   FINAL CLINICAL IMPRESSION(S) / ED DIAGNOSES  Final diagnoses:  Congestive heart failure, unspecified HF chronicity, unspecified heart failure type (HCC)  Shortness of breath  HCAP (healthcare-associated pneumonia)     Note: This dictation was prepared with Dragon dictation. Any transcriptional errors that result from this  process are unintentional     Phineas Semen, MD 03/17/18 1610    Phineas Semen, MD 03/28/18 1116

## 2018-03-17 NOTE — ED Notes (Signed)
Lights dimmed for patient comfort. TV turned on for patient comfort and patient repositioned in the bed. Will continue to monitor.

## 2018-03-17 NOTE — H&P (Signed)
[employer] [facility] [place] [facility]   PATIENT NAME: Scott Richard    MR#:  [id number]  DATE OF BIRTH:  [birth year]  DATE OF ADMISSION:  03/17/2018  PRIMARY CARE PHYSICIAN: Scott Code, MD   REQUESTING/REFERRING PHYSICIAN: Phineas Semen, MD  CHIEF COMPLAINT:   Chief Complaint  Patient presents with  . Shortness of Breath    HISTORY OF PRESENT ILLNESS:  Scott Richard  is a 82 y.o. male with a known history of COPD is being admitted for worsening SOB. His breathing started getting worse yesterday. Normally on 2L at home but they have had to increase his oxygen. He had associated cough productive of white/clear phlegm. Also has swollen lower extremities which are worsening.  PAST MEDICAL HISTORY:   Past Medical History:  Diagnosis Date  . Alcohol use   . Arthritis   . Atrial fibrillation (HCC)   . CHF (congestive heart failure) (HCC)   . COPD (chronic obstructive pulmonary disease) (HCC)   . Coronary artery disease   . Hyperlipidemia   . Hypertension   . Stroke [facility])     PAST SURGICAL HISTORY:   Past Surgical History:  Procedure Laterality Date  . NECK SURGERY    . TONSILLECTOMY      SOCIAL HISTORY:   Social History   Tobacco Use  . Smoking status: Former Smoker    Last attempt to quit: 2012    Years since quitting: 7.9  . Smokeless tobacco: Never Used  Substance Use Topics  . Alcohol use: No    FAMILY HISTORY:   Family History  Problem Relation Age of Onset  . Prostate cancer Neg Hx   . Bladder Cancer Neg Hx   . Kidney cancer Neg Hx     DRUG ALLERGIES:  No Known Allergies  REVIEW OF SYSTEMS:   Review of Systems  Constitutional: Positive for malaise/fatigue. Negative for diaphoresis, fever and weight loss.  HENT: Negative for ear discharge, ear pain, hearing loss, nosebleeds, sore throat and tinnitus.   Eyes: Negative for blurred vision and pain.  Respiratory: Positive for cough and shortness of breath. Negative for  hemoptysis and wheezing.   Cardiovascular: Positive for leg swelling. Negative for chest pain, palpitations and orthopnea.  Gastrointestinal: Negative for abdominal pain, blood in stool, constipation, diarrhea, heartburn, nausea and vomiting.  Genitourinary: Negative for dysuria, frequency and urgency.  Musculoskeletal: Negative for back pain and myalgias.  Skin: Negative for itching and rash.  Neurological: Negative for dizziness, tingling, tremors, focal weakness, seizures, weakness and headaches.  Psychiatric/Behavioral: Negative for depression. The patient is not nervous/anxious.     MEDICATIONS AT HOME:   Prior to Admission medications   Medication Sig Start Date End Date Taking? Authorizing Provider  acetaminophen (TYLENOL) 325 MG tablet Take 2 tablets (650 mg total) by mouth every 6 (six) hours as needed for mild pain or headache. 02/04/18  Yes Gouru, Deanna Artis, MD  albuterol (VENTOLIN HFA) 108 (90 Base) MCG/ACT inhaler Inhale 1-2 puffs into the lungs every 6 (six) hours as needed for wheezing or shortness of breath. 03/09/18  Yes Scarboro, Coralee North, NP  apixaban (ELIQUIS) 5 MG TABS tablet Take 1 tablet (5 mg total) by mouth 2 (two) times daily. 02/27/18 03/29/18 Yes Pyreddy, Vivien Rota, MD  carvedilol (COREG) 3.125 MG tablet Take 1 tablet (3.125 mg total) by mouth 2 (two) times daily with a meal. Patient taking differently: Take 3.125 mg by mouth daily.  03/09/18  Yes Carlean Jews, NP  Cholecalciferol (VITAMIN  D-1000 MAX ST) 1000 units tablet Take 1 tablet by mouth daily.   Yes [provider]  fluticasone (FLONASE) 50 MCG/ACT nasal spray Place 2 sprays into both nostrils daily. Patient taking differently: Place 2 sprays into both nostrils 2 (two) times daily.  01/17/18  Yes Boscia, Kathlynn GrateHeather E, NP  furosemide (LASIX) 20 MG tablet Take 2 tablets (40 mg total) by mouth daily. 02/27/18 03/29/18 Yes Pyreddy, Vivien RotaPavan, MD  lactose free nutrition (BOOST) LIQD Take 237 mLs by mouth 3 (three)  times daily with meals.   Yes [provider]  lisinopril (PRINIVIL,ZESTRIL) 2.5 MG tablet Take 1 tablet (2.5 mg total) by mouth daily. 03/09/18  Yes Carlean JewsBoscia, Heather E, NP  Multiple Vitamin (MULTIVITAMIN) capsule Take by mouth.   Yes [provider]  OXYGEN Inhale 2 L into the lungs continuous.   Yes [provider]  potassium chloride 20 MEQ TBCR Take 20 mEq by mouth daily. 02/27/18 03/29/18 Yes Pyreddy, Vivien RotaPavan, MD  pramipexole (MIRAPEX) 0.5 MG tablet Take 1 tablet (0.5 mg total) by mouth 3 (three) times daily. 02/28/18  Yes Scarboro, Coralee NorthAdam J, NP  SYMBICORT 160-4.5 MCG/ACT inhaler Inhale 2 puffs into the lungs 2 (two) times daily. 02/28/18  Yes Scarboro, Coralee NorthAdam J, NP  cetirizine (ZYRTEC) 10 MG tablet Take 10 mg by mouth daily.    [provider]      VITAL SIGNS:  Blood pressure 128/83, pulse 79, resp. rate (!) 26, weight 71.4 kg, SpO2 95 %.  PHYSICAL EXAMINATION:  Physical Exam  GENERAL:  82 y.o.-year-old patient lying in the bed with no acute distress.  EYES: Pupils equal, round, reactive to light and accommodation. No scleral icterus. Extraocular muscles intact.  HEENT: Head atraumatic, normocephalic. Oropharynx and nasopharynx clear.  NECK:  Supple, no jugular venous distention. No thyroid enlargement, no tenderness.  LUNGS: Decreased breath sounds bilaterally, no wheezing, rales,rhonchi or crepitation. No use of accessory muscles of respiration.  CARDIOVASCULAR: S1, S2 normal. No murmurs, rubs, or gallops.  ABDOMEN: Soft, nontender, nondistended. Bowel sounds present. No organomegaly or mass.  EXTREMITIES: 2 + pedal edema, No cyanosis, or clubbing.  NEUROLOGIC: Cranial nerves II through XII are intact. Muscle strength 5/5 in all extremities. Sensation intact. Gait not checked.  PSYCHIATRIC: The patient is alert and oriented x 3.  SKIN: No obvious rash, lesion, or ulcer.   LABORATORY PANEL:   CBC Recent Labs  Lab 03/17/18 0704  WBC 8.8  HGB  11.3*  HCT 37.1*  PLT 194   ------------------------------------------------------------------------------------------------------------------  Chemistries  Recent Labs  Lab 03/17/18 0704  NA 135  K 4.4  CL 87*  CO2 43*  GLUCOSE 144*  BUN 22  CREATININE 0.55*  CALCIUM 9.1   ------------------------------------------------------------------------------------------------------------------  Cardiac Enzymes Recent Labs  Lab 03/17/18 0704  TROPONINI <0.03   ------------------------------------------------------------------------------------------------------------------  RADIOLOGY:  Dg Chest Portable 1 View  Result Date: 03/17/2018 CLINICAL DATA:  82 year old male with shortness of breath since waking. On home oxygen. Abnormal pulmonary auscultation. EXAM: PORTABLE CHEST 1 VIEW COMPARISON:  Portable chest 02/25/2018 and earlier. FINDINGS: Portable AP semi upright view at 0709 hours. Chronic elevation of the right hemidiaphragm with subjacent gas-filled large bowel. Chronic cardiomegaly. Stable cardiac size and mediastinal contours. Chronic increased pulmonary interstitial opacity but mildly increased from November studies. No pneumothorax, pleural effusion or consolidation. Partially visible extensive cervical spine hardware. Widespread chronic left lateral rib fractures. IMPRESSION: 1. Increased chronic pulmonary interstitial opacity since November. Consider interstitial edema versus acute viral/atypical respiratory infection. 2. Chronic low lung  volumes with elevation of the right hemidiaphragm and cardiomegaly. Electronically Signed   By: Odessa Fleming M.D.   On: 03/17/2018 07:40   IMPRESSION AND PLAN:  74 y m with acute on chronic hypoxic resp failure  * Acute on chronic hypoxic resp failure - likely due to CHF exacerbation - O2 via [place]  * Acute on chronic diastolic CHF - Echo in 10/19 showed EF 60-65% - Diuresis with lasix - strict I & Os, daily weight  *  Left buttock with  stage 2 pressure injury - wound care c/s  * A.fib: - continue Eliquis for anticoagulation - Coreg for rate control   Poor prognosis   All the records are reviewed and case discussed with ED provider. Management plans discussed with the patient, nursing and they are in agreement.  Richard STATUS: DNR  TOTAL TIME TAKING CARE OF THIS PATIENT: 45 minutes.    Delfino Lovett M.D on 03/17/2018 at 8:37 AM  Between 7am to 6pm - Pager - [phone]  After 6pm go to [email or web address] - password [email or web address] [facility]  [employer] [facility] [facility]  Office  [phone]  CC: Primary care physician; Scott Code, MD   Note: This dictation was prepared with Dragon dictation along with smaller phrase technology. Any transcriptional errors that result from this process are unintentional.

## 2018-03-17 NOTE — Plan of Care (Signed)
  Problem: Education: Goal: Knowledge of General Education information will improve Description: Including pain rating scale, medication(s)/side effects and non-pharmacologic comfort measures Outcome: Progressing   Problem: Health Behavior/Discharge Planning: Goal: Ability to manage health-related needs will improve Outcome: Progressing   Problem: Clinical Measurements: Goal: Ability to maintain clinical measurements within normal limits will improve Outcome: Progressing Goal: Will remain free from infection Outcome: Progressing Goal: Diagnostic test results will improve Outcome: Progressing Goal: Respiratory complications will improve Outcome: Progressing Goal: Cardiovascular complication will be avoided Outcome: Progressing   Problem: Activity: Goal: Risk for activity intolerance will decrease Outcome: Progressing   Problem: Nutrition: Goal: Adequate nutrition will be maintained Outcome: Progressing   Problem: Coping: Goal: Level of anxiety will decrease Outcome: Progressing   Problem: Elimination: Goal: Will not experience complications related to bowel motility Outcome: Progressing Goal: Will not experience complications related to urinary retention Outcome: Progressing   Problem: Pain Managment: Goal: General experience of comfort will improve Outcome: Progressing   Problem: Safety: Goal: Ability to remain free from injury will improve Outcome: Progressing   Problem: Skin Integrity: Goal: Risk for impaired skin integrity will decrease Outcome: Progressing   Problem: Education: Goal: Ability to demonstrate management of disease process will improve Outcome: Progressing Goal: Ability to verbalize understanding of medication therapies will improve Outcome: Progressing Goal: Individualized Educational Video(s) Outcome: Progressing   Problem: Activity: Goal: Capacity to carry out activities will improve Outcome: Progressing   Problem: Cardiac: Goal:  Ability to achieve and maintain adequate cardiopulmonary perfusion will improve Outcome: Progressing   Problem: Activity: Goal: Ability to tolerate increased activity will improve Outcome: Progressing   Problem: Clinical Measurements: Goal: Ability to maintain a body temperature in the normal range will improve Outcome: Progressing   Problem: Respiratory: Goal: Ability to maintain adequate ventilation will improve Outcome: Progressing Goal: Ability to maintain a clear airway will improve Outcome: Progressing   

## 2018-03-17 NOTE — ED Notes (Signed)
Pt on 4L nasal cannula.

## 2018-03-17 NOTE — ED Notes (Signed)
Pt urinated on self in bed. Pt cleaned and repositioned in bed. Warm blanket given. Scrotal swelling noted while performing peri-care, MD notified.

## 2018-03-17 NOTE — ED Notes (Signed)
Pharmacy called to alert pharmacist that patient is a 2A hold and to please verify meds.

## 2018-03-18 ENCOUNTER — Inpatient Hospital Stay: Payer: Medicare HMO

## 2018-03-18 LAB — BASIC METABOLIC PANEL
Anion gap: 6 (ref 5–15)
BUN: 18 mg/dL (ref 8–23)
CO2: 46 mmol/L — ABNORMAL HIGH (ref 22–32)
Calcium: 8.8 mg/dL — ABNORMAL LOW (ref 8.9–10.3)
Chloride: 84 mmol/L — ABNORMAL LOW (ref 98–111)
Creatinine, Ser: 0.66 mg/dL (ref 0.61–1.24)
Glucose, Bld: 85 mg/dL (ref 70–99)
Potassium: 3.9 mmol/L (ref 3.5–5.1)
SODIUM: 136 mmol/L (ref 135–145)

## 2018-03-18 MED ORDER — SODIUM CHLORIDE 0.9% FLUSH
3.0000 mL | Freq: Two times a day (BID) | INTRAVENOUS | Status: DC
  Administered 2018-03-18 – 2018-03-20 (×4): 3 mL via INTRAVENOUS

## 2018-03-18 NOTE — Progress Notes (Signed)
[employer] [facility] [place] [facility]   PATIENT NAME: Scott Richard    MR#:  [id number]  DATE OF BIRTH:  04-17-1935  SUBJECTIVE:  CHIEF COMPLAINT:   Chief Complaint  Patient presents with  . Shortness of Breath   Came with worsening shortness of breath.  Noted to have CHF exacerbation.  Started on Lasix IV.  He uses BiPAP every night at home but was not given over here.  Was hypoxic today morning and started on nonrebreather mask but later feeling very comfortable . REVIEW OF SYSTEMS:  CONSTITUTIONAL: No fever, fatigue or weakness.  EYES: No blurred or double vision.  EARS, NOSE, AND THROAT: No tinnitus or ear pain.  RESPIRATORY: No cough, have shortness of breath, no wheezing or hemoptysis.  CARDIOVASCULAR: No chest pain, orthopnea, edema.  GASTROINTESTINAL: No nausea, vomiting, diarrhea or abdominal pain.  GENITOURINARY: No dysuria, hematuria.  ENDOCRINE: No polyuria, nocturia,  HEMATOLOGY: No anemia, easy bruising or bleeding SKIN: No rash or lesion. MUSCULOSKELETAL: No joint pain or arthritis.   NEUROLOGIC: No tingling, numbness, weakness.  PSYCHIATRY: No anxiety or depression.   ROS  DRUG ALLERGIES:  No Known Allergies  VITALS:  Blood pressure 113/61, pulse 65, temperature 98.4 F (36.9 C), temperature source Oral, resp. rate 19, height 5\' 6"  (1.676 m), weight 68.4 kg, SpO2 100 %.  PHYSICAL EXAMINATION:  GENERAL:  82 y.o.-year-old patient lying in the bed with no acute distress.  EYES: Pupils equal, round, reactive to light and accommodation. No scleral icterus. Extraocular muscles intact.  HEENT: Head atraumatic, normocephalic. Oropharynx and nasopharynx clear.  NECK:  Supple, no jugular venous distention. No thyroid enlargement, no tenderness.  LUNGS: Normal breath sounds bilaterally, no wheezing, bilateral crepitation. No use of accessory muscles of respiration.  CARDIOVASCULAR: S1, S2 normal. No murmurs, rubs, or gallops.  ABDOMEN: Soft,  nontender, nondistended. Bowel sounds present. No organomegaly or mass.  EXTREMITIES: No pedal edema, cyanosis, or clubbing.  NEUROLOGIC: Cranial nerves II through XII are intact. Muscle strength 4/5 in all extremities. Sensation intact. Gait not checked.  PSYCHIATRIC: The patient is alert and oriented x 3.  SKIN: No obvious rash, lesion, or ulcer.   Physical Exam LABORATORY PANEL:   CBC Recent Labs  Lab 03/17/18 0704  WBC 8.8  HGB 11.3*  HCT 37.1*  PLT 194   ------------------------------------------------------------------------------------------------------------------  Chemistries  Recent Labs  Lab 03/17/18 1239 03/18/18 0305  NA  --  136  K  --  3.9  CL  --  84*  CO2  --  46*  GLUCOSE  --  85  BUN  --  18  CREATININE  --  0.66  CALCIUM  --  8.8*  MG 1.7  --    ------------------------------------------------------------------------------------------------------------------  Cardiac Enzymes Recent Labs  Lab 03/17/18 1428 03/17/18 2014  TROPONINI <0.03 <0.03   ------------------------------------------------------------------------------------------------------------------  RADIOLOGY:  Dg Chest 1 View  Result Date: 03/18/2018 CLINICAL DATA:  82 year old male with a history of hypoxia EXAM: CHEST  1 VIEW COMPARISON:  03/17/2018, 02/25/2018, 02/01/2018, 02/24/2018 FINDINGS: Cardiomediastinal silhouette unchanged in size and contour, partially obscured by overlying lung/pleural disease. Low lung volumes with crowding of the interstitium and the vasculature. Opacity at the left lung base obscuring the left hemidiaphragm and left heart border. Interposition of gas-filled colon subjacent to the right diaphragm. Surgical changes of the cervical region. No pneumothorax. Coarsened interstitial markings. IMPRESSION: Persistently low lung volumes with elevation the right hemidiaphragm and likely atelectasis. Interstitial pattern of opacity persists, and could represent  chronic changes versus atypical infection. Opacity at the left lung base, potentially pleural fluid, atelectasis/consolidation. Interposition of the colon beneath the right hemidiaphragm. Cervical surgical changes. Electronically Signed   By: Gilmer Mor D.O.   On: 03/18/2018 10:09   Dg Chest Portable 1 View  Result Date: 03/17/2018 CLINICAL DATA:  82 year old male with shortness of breath since waking. On home oxygen. Abnormal pulmonary auscultation. EXAM: PORTABLE CHEST 1 VIEW COMPARISON:  Portable chest 02/25/2018 and earlier. FINDINGS: Portable AP semi upright view at 0709 hours. Chronic elevation of the right hemidiaphragm with subjacent gas-filled large bowel. Chronic cardiomegaly. Stable cardiac size and mediastinal contours. Chronic increased pulmonary interstitial opacity but mildly increased from November studies. No pneumothorax, pleural effusion or consolidation. Partially visible extensive cervical spine hardware. Widespread chronic left lateral rib fractures. IMPRESSION: 1. Increased chronic pulmonary interstitial opacity since November. Consider interstitial edema versus acute viral/atypical respiratory infection. 2. Chronic low lung volumes with elevation of the right hemidiaphragm and cardiomegaly. Electronically Signed   By: Odessa Fleming M.D.   On: 03/17/2018 07:40    ASSESSMENT AND PLAN:   Active Problems:   CHF (congestive heart failure) (HCC)  * Acute on chronic hypoxic resp failure - likely due to CHF exacerbation - O2 via [place] -On supplemental oxygen at home and uses BiPAP at night, continue that. -Patient was more hypoxic today morning because of not using BiPAP last night but comfortable after giving nonrebreather mask for 1 to 2 hours.  * Acute on chronic diastolic CHF - Echo in 10/19 showed EF 60-65% - Diuresis with lasix - strict I & Os, daily weight  Appreciated cardio consult, cont same meds.  * Left buttock with stage 2 pressure injury - wound care c/s  *  A.fib: - continue Eliquis for anticoagulation - Coreg for rate control   All the records are reviewed and case discussed with Care Management/Social Workerr. Management plans discussed with the patient, family and they are in agreement.  CODE STATUS: full.  TOTAL TIME TAKING CARE OF THIS PATIENT: 35 minutes.   At home with sister and had home health agencies before.  POSSIBLE D/C IN 1-2 11 DAYS, DEPENDING ON CLINICAL CONDITION.   Altamese Dilling M.D on 03/18/2018   Between 7am to 6pm - Pager - [phone]  After 6pm go to [email or web address] - password [email or web address] [facility]  [facility] [place] [facility]  Office  [phone]  CC: Primary care physician; Lyndon Code, MD  Note: This dictation was prepared with Dragon dictation along with smaller phrase technology. Any transcriptional errors that result from this process are unintentional.

## 2018-03-18 NOTE — Progress Notes (Addendum)
Family Meeting Note  Advance Directive:no  Today a meeting took place with the Patient and Sister on phone.  The following clinical team members were present during this meeting:MD  The following were discussed:Patient's diagnosis: CHF, COPD, respi failure, Patient's progosis: Unable to determine and Goals for treatment: Full Code  On admission patient was listed as DNR, but when I asked him, he confirms that he would like to have everything to be done but would not like to stay on the ventilator machine for a long time.  His sister on the phone also confirms this wishes. I have changed the CODE STATUS to full code.  Additional follow-up to be provided: Cardiology  Time spent during discussion:20 minutes  Scott DillingVaibhavkumar Scotti Kosta, MD

## 2018-03-18 NOTE — Progress Notes (Signed)
Patients O2 sats in 80's on 6L, placed on non-rebreather, sats came up to 97%, MD notified, CXR ordered, will to continue to monitor.

## 2018-03-18 NOTE — Progress Notes (Signed)
No charge note:   Palliative  Thank you for this consult.   Attempted to call patient's sister- [name]- to arrange GOC discussion. Left message requesting return call.   Ocie BobKasie Mahan, AGNP-C Palliative Medicine  Please call Palliative Medicine team phone with any questions [phone]. For individual providers please see AMION.

## 2018-03-18 NOTE — Progress Notes (Signed)
Patient CBG was 426. Called Dr. Caryn BeeMaier gave verbal orders of 12 units on Novolog once.

## 2018-03-18 NOTE — Consult Note (Signed)
[facility]  CARDIOLOGY CONSULT NOTE  Patient ID: LIBERO PUTHOFF MRN: [id number] DOB/AGE: 04/24/1935 82 y.o.  Admit date: 03/17/2018 Referring Physician Sherryll Burger Primary Physician [facility] Primary Cardiologist Darrelyn Morro Reason for Consultation wide-complex tachycardia  HPI: 82 year old gentleman referred for evaluation of wide-complex tachycardia.  She is admitted with respiratory failure, likely multifactorial, secondary to underlying COPD, with acute on chronic systolic congestive heart failure.  Patient treated with inhaler therapy and venous diuretics with overall clinical improvement.  The patient has ruled out for myocardial infarction with negative troponin x3.  Telemetry reveals episodes of ocular wide-complex tachycardia most consistent with atrial fibrillation with aberrancy.  2D echocardiogram 02/02/2018 revealed normal left ventricular function, with LVEF 60 to 65%.  Patient has a history of excisional atrial fibrillation, on Eliquis for stroke prevention.  Review of systems complete and found to be negative unless listed above     Past Medical History:  Diagnosis Date  . Alcohol use   . Arthritis   . Atrial fibrillation (HCC)   . CHF (congestive heart failure) (HCC)   . COPD (chronic obstructive pulmonary disease) (HCC)   . Coronary artery disease   . Hyperlipidemia   . Hypertension   . Stroke [facility])     Past Surgical History:  Procedure Laterality Date  . NECK SURGERY    . TONSILLECTOMY      Medications Prior to Admission  Medication Sig Dispense Refill Last Dose  . acetaminophen (TYLENOL) 325 MG tablet Take 2 tablets (650 mg total) by mouth every 6 (six) hours as needed for mild pain or headache.   prn at prn  . albuterol (VENTOLIN HFA) 108 (90 Base) MCG/ACT inhaler Inhale 1-2 puffs into the lungs every 6 (six) hours as needed for wheezing or shortness of breath. 18 g 2 prn at prn  . apixaban (ELIQUIS) 5 MG TABS tablet Take 1 tablet (5 mg total) by mouth 2 (two) times  daily. 60 tablet 0 03/16/2018 at 2100  . carvedilol (COREG) 3.125 MG tablet Take 1 tablet (3.125 mg total) by mouth 2 (two) times daily with a meal. (Patient taking differently: Take 3.125 mg by mouth daily. ) 60 tablet 3 03/16/2018 at 0800  . Cholecalciferol (VITAMIN D-1000 MAX ST) 1000 units tablet Take 1 tablet by mouth daily.   03/16/2018 at 0800  . fluticasone (FLONASE) 50 MCG/ACT nasal spray Place 2 sprays into both nostrils daily. (Patient taking differently: Place 2 sprays into both nostrils 2 (two) times daily. ) 16 g 3 03/16/2018 at 2100  . furosemide (LASIX) 20 MG tablet Take 2 tablets (40 mg total) by mouth daily. 60 tablet 0 03/16/2018 at 0800  . lactose free nutrition (BOOST) LIQD Take 237 mLs by mouth 3 (three) times daily with meals.   03/16/2018 at 1800  . lisinopril (PRINIVIL,ZESTRIL) 2.5 MG tablet Take 1 tablet (2.5 mg total) by mouth daily. 30 tablet 3 03/16/2018 at 0800  . Multiple Vitamin (MULTIVITAMIN) capsule Take by mouth.   03/16/2018 at 0800  . OXYGEN Inhale 2 L into the lungs continuous.   03/17/2018 at 0800  . potassium chloride 20 MEQ TBCR Take 20 mEq by mouth daily. 30 tablet 0 03/16/2018 at 0800  . pramipexole (MIRAPEX) 0.5 MG tablet Take 1 tablet (0.5 mg total) by mouth 3 (three) times daily. 90 tablet 0 03/16/2018 at 2100  . SYMBICORT 160-4.5 MCG/ACT inhaler Inhale 2 puffs into the lungs 2 (two) times daily. 10.2 g 0 03/16/2018 at 2100  . cetirizine (ZYRTEC) 10 MG tablet  Take 10 mg by mouth daily.   Not Taking at Unknown time   Social History   Socioeconomic History  . Marital status: Divorced    Spouse name: Not on file  . Number of children: Not on file  . Years of education: Not on file  . Highest education level: Not on file  Occupational History  . Not on file  Social Needs  . Financial resource strain: Not on file  . Food insecurity:    Worry: Not on file    Inability: Not on file  . Transportation needs:    Medical: Not on file    Non-medical: Not on file   Tobacco Use  . Smoking status: Former Smoker    Last attempt to quit: 2012    Years since quitting: 7.9  . Smokeless tobacco: Never Used  Substance and Sexual Activity  . Alcohol use: No  . Drug use: No  . Sexual activity: Not on file  Lifestyle  . Physical activity:    Days per week: Not on file    Minutes per session: Not on file  . Stress: Not on file  Relationships  . Social connections:    Talks on phone: Not on file    Gets together: Not on file    Attends religious service: Not on file    Active member of club or organization: Not on file    Attends meetings of clubs or organizations: Not on file    Relationship status: Not on file  . Intimate partner violence:    Fear of current or ex partner: Not on file    Emotionally abused: Not on file    Physically abused: Not on file    Forced sexual activity: Not on file  Other Topics Concern  . Not on file  Social History Narrative  . Not on file    Family History  Problem Relation Age of Onset  . Prostate cancer Neg Hx   . Bladder Cancer Neg Hx   . Kidney cancer Neg Hx       Review of systems complete and found to be negative unless listed above      PHYSICAL EXAM  General: Well developed, well nourished, in no acute distress HEENT:  Normocephalic and atramatic Neck:  No JVD.  Lungs: Clear bilaterally to auscultation and percussion. Heart: HRRR . Normal S1 and S2 without gallops or murmurs.  Abdomen: Bowel sounds are positive, abdomen soft and non-tender  Msk:  Back normal, normal gait. Normal strength and tone for age. Extremities: No clubbing, cyanosis or edema.   Neuro: Alert and oriented X 3. Psych:  Good affect, responds appropriately  Labs:   Lab Results  Component Value Date   WBC 8.8 03/17/2018   HGB 11.3 (L) 03/17/2018   HCT 37.1 (L) 03/17/2018   MCV 97.6 03/17/2018   PLT 194 03/17/2018    Recent Labs  Lab 03/18/18 0305  NA 136  K 3.9  CL 84*  CO2 46*  BUN 18  CREATININE 0.66   CALCIUM 8.8*  GLUCOSE 85   Lab Results  Component Value Date   TROPONINI <0.03 03/17/2018   No results found for: CHOL No results found for: HDL No results found for: LDLCALC No results found for: TRIG No results found for: CHOLHDL No results found for: LDLDIRECT    Radiology: US Venous Img Lower Bilateral  Result Date: 02/24/2018 CLINICAL DATA:  Bilateral lower extremity edema.  Evaluate for DVT. EXAM: BILATERAL LOWER  EXTREMITY VENOUS DOPPLER ULTRASOUND TECHNIQUE: Gray-scale sonography with graded compression, as well as color Doppler and duplex ultrasound were performed to evaluate the lower extremity deep venous systems from the level of the common femoral vein and including the common femoral, femoral, profunda femoral, popliteal and calf veins including the posterior tibial, peroneal and gastrocnemius veins when visible. The superficial great saphenous vein was also interrogated. Spectral Doppler was utilized to evaluate flow at rest and with distal augmentation maneuvers in the common femoral, femoral and popliteal veins. COMPARISON:  None. FINDINGS: RIGHT LOWER EXTREMITY Common Femoral Vein: No evidence of thrombus. Normal compressibility, respiratory phasicity and response to augmentation. Saphenofemoral Junction: No evidence of thrombus. Normal compressibility and flow on color Doppler imaging. Profunda Femoral Vein: No evidence of thrombus. Normal compressibility and flow on color Doppler imaging. Femoral Vein: No evidence of thrombus. Normal compressibility, respiratory phasicity and response to augmentation. Popliteal Vein: No evidence of thrombus. Normal compressibility, respiratory phasicity and response to augmentation. Calf Veins: No evidence of thrombus. Normal compressibility and flow on color Doppler imaging. Superficial Great Saphenous Vein: No evidence of thrombus. Normal compressibility. Venous Reflux:  None. Other Findings:  None. LEFT LOWER EXTREMITY Common Femoral Vein: No  evidence of thrombus. Normal compressibility, respiratory phasicity and response to augmentation. Saphenofemoral Junction: No evidence of thrombus. Normal compressibility and flow on color Doppler imaging. Profunda Femoral Vein: No evidence of thrombus. Normal compressibility and flow on color Doppler imaging. Femoral Vein: No evidence of thrombus. Normal compressibility, respiratory phasicity and response to augmentation. Popliteal Vein: No evidence of thrombus. Normal compressibility, respiratory phasicity and response to augmentation. Calf Veins: No evidence of thrombus. Normal compressibility and flow on color Doppler imaging. Superficial Great Saphenous Vein: No evidence of thrombus. Normal compressibility. Venous Reflux:  None. Other Findings:  None. IMPRESSION: No evidence of DVT within either lower extremity. Electronically Signed   By: Simonne ComeJohn  Watts M.D.   On: 02/24/2018 16:34   Dg Chest Portable 1 View  Result Date: 03/17/2018 CLINICAL DATA:  82 year old male with shortness of breath since waking. On home oxygen. Abnormal pulmonary auscultation. EXAM: PORTABLE CHEST 1 VIEW COMPARISON:  Portable chest 02/25/2018 and earlier. FINDINGS: Portable AP semi upright view at 0709 hours. Chronic elevation of the right hemidiaphragm with subjacent gas-filled large bowel. Chronic cardiomegaly. Stable cardiac size and mediastinal contours. Chronic increased pulmonary interstitial opacity but mildly increased from November studies. No pneumothorax, pleural effusion or consolidation. Partially visible extensive cervical spine hardware. Widespread chronic left lateral rib fractures. IMPRESSION: 1. Increased chronic pulmonary interstitial opacity since November. Consider interstitial edema versus acute viral/atypical respiratory infection. 2. Chronic low lung volumes with elevation of the right hemidiaphragm and cardiomegaly. Electronically Signed   By: Odessa FlemingH  Hall M.D.   On: 03/17/2018 07:40   Dg Chest Port 1  View  Result Date: 02/25/2018 CLINICAL DATA:  Hx of COPD, CHF, CAD. Former smoker 2012 Now with increasing SOB, pt on CPAP EXAM: PORTABLE CHEST 1 VIEW COMPARISON:  02/24/2018 FINDINGS: There is stable elevation of the RIGHT hemidiaphragm. Patient is rotated towards the LEFT. The heart size is probably normal. There is persistent opacity in the LEFT lung base partially obscuring the hemidiaphragm. No pulmonary edema. Remote rib fractures. IMPRESSION: Persistent LEFT LOWER lobe atelectasis or infiltrate. Electronically Signed   By: Norva PavlovElizabeth  Brown M.D.   On: 02/25/2018 09:41   Dg Chest Port 1 View  Result Date: 02/24/2018 CLINICAL DATA:  Acute onset of dyspnea. EXAM: PORTABLE CHEST 1 VIEW COMPARISON:  Chest radiograph performed 02/01/2018  FINDINGS: The lungs are hypoexpanded. A small left pleural effusion is noted. There is elevation of the right hemidiaphragm. Vascular crowding is noted. Left basilar airspace opacification could reflect pneumonia, depending on the patient's symptoms. There is no evidence of pneumothorax. The cardiomediastinal silhouette is within normal limits. No acute osseous abnormalities are seen. Cervical spinal fusion hardware is partially imaged. IMPRESSION: Lungs hypoexpanded. Small left pleural effusion noted. Left basilar airspace opacification could reflect pneumonia, depending on the patient's symptoms. Electronically Signed   By: Roanna Raider M.D.   On: 02/24/2018 04:39    EKG: Atrial fibrillation at a rate of 70 bpm  ASSESSMENT AND PLAN:   1.  Nonsustained wide-complex tachycardia, likely due to atrial fibrillation with aberrancy, with negative troponin, and normal left ventricular function by 2D echocardiogram, with history of atrial fibrillation 2.  Acute on chronic diastolic congestive heart failure, 3.  Persistent atrial fibrillation on Eliquis for stroke prevention  Recommendations  1.  Agree with current therapy 2.  Continue diuresis 3.  Carefully monitor  renal status 4.  Continue Eliquis for stroke prevention 5.  Defer further cardiac diagnostics at this time  Signed: Marcina Millard MD,PhD, [facility] 03/18/2018, 8:18 AM

## 2018-03-18 NOTE — Care Management Note (Signed)
Case Management Note  Patient Details  Name: Scott Richard MRN: [id number] Date of Birth: 10-28-1935  Subjective/Objective:    CM consult ordered.  Patient is from home; lives with sister.  Hx COPD, CHF, CAD, AFIB.  Admitted with CHF exacerbation.   Receiving 40mg  IV Lasix BID.  Chronic oxygen at home at 2L.  Has BiPAP at home but refuses to wear; states he does not like it but will start wearing it once discharged.   [facility]   Currently on 4L; currently getting CXRAY.  He is open to [facility] for RN, PT, Nilda Calamityeds Vest.  Teresa aware Will notify her when discharged.  Palliative consult pending.  [facility]  He uses a walker at home.  His sister transports him as needed.  Has a heart failure clinic appointment.  When asked if he is current with the heart failure clinic he stated, "I don't have heart failure".    This RNCM notified Diane, RN, cardiac nurse navigator of conversation.  Patient does have a scale at home.  Will continue to follow through discharge.      Action/Plan:   Expected Discharge Date:                  Expected Discharge Plan:  Home w Home Health Services  In-House Referral:     Discharge planning Services  CM Consult, HF Clinic  Post Acute Care Choice:  Resumption of Svcs/PTA Provider Choice offered to:     DME Arranged:    DME Agency:     HH Arranged:    [facility]  Status of Service:  Completed, signed off  If discussed at [employer] Length [employer] Stay Meetings, dates discussed:    Additional Comments:  Sherren KernsJennifer L Lillion Elbert, RN 03/18/2018, 9:03 AM

## 2018-03-19 LAB — BASIC METABOLIC PANEL
Anion gap: 7 (ref 5–15)
BUN: 23 mg/dL (ref 8–23)
CO2: 46 mmol/L — ABNORMAL HIGH (ref 22–32)
Calcium: 8.7 mg/dL — ABNORMAL LOW (ref 8.9–10.3)
Chloride: 83 mmol/L — ABNORMAL LOW (ref 98–111)
Creatinine, Ser: 0.72 mg/dL (ref 0.61–1.24)
GFR calc Af Amer: >60 mL/min (ref >60)
GFR calc non Af Amer: >60 mL/min (ref >60)
Glucose, Bld: 96 mg/dL (ref 70–99)
Potassium: 3.8 mmol/L (ref 3.5–5.1)
Sodium: 136 mmol/L (ref 135–145)

## 2018-03-19 MED ORDER — FUROSEMIDE 10 MG/ML IJ SOLN
20.0000 mg | Freq: Two times a day (BID) | INTRAMUSCULAR | Status: DC
  Administered 2018-03-19 – 2018-03-20 (×2): 20 mg via INTRAVENOUS
  Filled 2018-03-19 (×2): qty 2

## 2018-03-19 NOTE — Plan of Care (Signed)
?  Problem: Education: ?Goal: Ability to verbalize understanding of medication therapies will improve ?Outcome: Progressing ?  ?Problem: Activity: ?Goal: Capacity to carry out activities will improve ?Outcome: Progressing ?  ?Problem: Cardiac: ?Goal: Ability to achieve and maintain adequate cardiopulmonary perfusion will improve ?Outcome: Progressing ?  ?

## 2018-03-19 NOTE — Progress Notes (Signed)
[employer] - [place] [facility]   PATIENT NAME: Scott Richard    MR#:  [id number]  DATE OF BIRTH:  February 15, 1936  SUBJECTIVE:  CHIEF COMPLAINT:   Chief Complaint  Patient presents with  . Shortness of Breath   Came with worsening shortness of breath.  Noted to have CHF exacerbation.  Started on Lasix IV.   Feeling much better today, back on 3 L nasal cannula oxygen which is baseline.  REVIEW OF SYSTEMS:  CONSTITUTIONAL: No fever, fatigue or weakness.  EYES: No blurred or double vision.  EARS, NOSE, AND THROAT: No tinnitus or ear pain.  RESPIRATORY: No cough, have shortness of breath, no wheezing or hemoptysis.  CARDIOVASCULAR: No chest pain, orthopnea, edema.  GASTROINTESTINAL: No nausea, vomiting, diarrhea or abdominal pain.  GENITOURINARY: No dysuria, hematuria.  ENDOCRINE: No polyuria, nocturia,  HEMATOLOGY: No anemia, easy bruising or bleeding SKIN: No rash or lesion. MUSCULOSKELETAL: No joint pain or arthritis.   NEUROLOGIC: No tingling, numbness, weakness.  PSYCHIATRY: No anxiety or depression.   ROS  DRUG ALLERGIES:  No Known Allergies  VITALS:  Blood pressure 103/63, pulse 67, temperature (!) 97.4 F (36.3 C), temperature source Oral, resp. rate 18, height 5\' 6"  (1.676 m), weight 66.1 kg, SpO2 100 %.  PHYSICAL EXAMINATION:  GENERAL:  82 y.o.-year-old patient lying in the bed with no acute distress.  EYES: Pupils equal, round, reactive to light and accommodation. No scleral icterus. Extraocular muscles intact.  HEENT: Head atraumatic, normocephalic. Oropharynx and nasopharynx clear.  NECK:  Supple, no jugular venous distention. No thyroid enlargement, no tenderness.  LUNGS: Normal breath sounds bilaterally, no wheezing, bilateral crepitation. No use of accessory muscles of respiration.  CARDIOVASCULAR: S1, S2 normal. No murmurs, rubs, or gallops.  ABDOMEN: Soft, nontender, nondistended. Bowel sounds present. No organomegaly or mass.   EXTREMITIES: No pedal edema, cyanosis, or clubbing.  NEUROLOGIC: Cranial nerves II through XII are intact. Muscle strength 4/5 in all extremities. Sensation intact. Gait not checked.  PSYCHIATRIC: The patient is alert and oriented x 3.  SKIN: No obvious rash, lesion, or ulcer.   Physical Exam LABORATORY PANEL:   CBC Recent Labs  Lab 03/17/18 0704  WBC 8.8  HGB 11.3*  HCT 37.1*  PLT 194   ------------------------------------------------------------------------------------------------------------------  Chemistries  Recent Labs  Lab 03/17/18 1239  03/19/18 0436  NA  --    < > 136  K  --    < > 3.8  CL  --    < > 83*  CO2  --    < > 46*  GLUCOSE  --    < > 96  BUN  --    < > 23  CREATININE  --    < > 0.72  CALCIUM  --    < > 8.7*  MG 1.7  --   --    < > = values in this interval not displayed.   ------------------------------------------------------------------------------------------------------------------  Cardiac Enzymes Recent Labs  Lab 03/17/18 1428 03/17/18 2014  TROPONINI <0.03 <0.03   ------------------------------------------------------------------------------------------------------------------  RADIOLOGY:  Dg Chest 1 View  Result Date: 03/18/2018 CLINICAL DATA:  82 year old male with a history of hypoxia EXAM: CHEST  1 VIEW COMPARISON:  03/17/2018, 02/25/2018, 02/01/2018, 02/24/2018 FINDINGS: Cardiomediastinal silhouette unchanged in size and contour, partially obscured by overlying lung/pleural disease. Low lung volumes with crowding of the interstitium and the vasculature. Opacity at the left lung base obscuring the left hemidiaphragm and left heart border. Interposition of gas-filled colon subjacent to the  right diaphragm. Surgical changes of the cervical region. No pneumothorax. Coarsened interstitial markings. IMPRESSION: Persistently low lung volumes with elevation the right hemidiaphragm and likely atelectasis. Interstitial pattern of opacity  persists, and could represent chronic changes versus atypical infection. Opacity at the left lung base, potentially pleural fluid, atelectasis/consolidation. Interposition of the colon beneath the right hemidiaphragm. Cervical surgical changes. Electronically Signed   By: Gilmer Mor D.O.   On: 03/18/2018 10:09    ASSESSMENT AND PLAN:   Active Problems:   CHF (congestive heart failure) (HCC)  * Acute on chronic hypoxic resp failure - likely due to CHF exacerbation -On supplemental oxygen at home and uses BiPAP at night, continue that. -Back to baseline at 3 L nasal cannula oxygen now.  * Acute on chronic diastolic CHF - Echo in 10/19 showed EF 60-65% - Diuresis with lasix - strict I & Os, daily weight  Appreciated cardio consult, cont same meds.  * Left buttock with stage 2 pressure injury - wound care c/s  * A.fib: - continue Eliquis for anticoagulation - Coreg for rate control   All the records are reviewed and case discussed with Care Management/Social Workerr. Management plans discussed with the patient, family and they are in agreement.  CODE STATUS: full.  TOTAL TIME TAKING CARE OF THIS PATIENT: 35 minutes.   At home with sister and had home health agencies before. As per nurse, was very weak today on trying to get up and walk in the room so I will call physical therapy evaluation.  POSSIBLE D/C IN 1-2  DAYS, DEPENDING ON CLINICAL CONDITION.   Altamese Dilling M.D on 03/19/2018   Between 7am to 6pm - Pager - [phone]  After 6pm go to [email or web address] - password [email or web address] [facility]  [facility]  Office  [phone]  CC: Primary care physician; Lyndon Code, MD  Note: This dictation was prepared with Dragon dictation along with smaller phrase technology. Any transcriptional errors that result from this process are unintentional.

## 2018-03-19 NOTE — Progress Notes (Signed)
[facility]  SUBJECTIVE: Patient laying in bed, denies chest pain or shortness of breath   Vitals:   03/18/18 2223 03/19/18 0300 03/19/18 0414 03/19/18 0829  BP:   118/86 110/68  Pulse:   77 73  Resp:   16 18  Temp:   98.4 F (36.9 C) 97.9 F (36.6 C)  TempSrc:   Oral Oral  SpO2: 97%  95% 96%  Weight:  66.1 kg    Height:        No intake or output data in the 24 hours ending 03/19/18 0906    PHYSICAL EXAM  General: Well developed, well nourished, in no acute distress HEENT:  Normocephalic and atramatic Neck:  No JVD.  Lungs: Clear bilaterally to auscultation and percussion. Heart: HRRR . Normal S1 and S2 without gallops or murmurs.  Abdomen: Bowel sounds are positive, abdomen soft and non-tender  Msk:  Back normal, normal gait. Normal strength and tone for age. Extremities: No clubbing, cyanosis or edema.   Neuro: Alert and oriented X 3. Psych:  Good affect, responds appropriately   LABS: Basic Metabolic Panel: Recent Labs    03/17/18 1239 03/18/18 0305 03/19/18 0436  NA  --  136 136  K  --  3.9 3.8  CL  --  84* 83*  CO2  --  46* 46*  GLUCOSE  --  85 96  BUN  --  18 23  CREATININE  --  0.66 0.72  CALCIUM  --  8.8* 8.7*  MG 1.7  --   --    Liver Function Tests: No results for input(s): AST, ALT, ALKPHOS, BILITOT, PROT, ALBUMIN in the last 72 hours. No results for input(s): LIPASE, AMYLASE in the last 72 hours. CBC: Recent Labs    03/17/18 0704  WBC 8.8  HGB 11.3*  HCT 37.1*  MCV 97.6  PLT 194   Cardiac Enzymes: Recent Labs    03/17/18 1239 03/17/18 1428 03/17/18 2014  TROPONINI <0.03 <0.03 <0.03   BNP: Invalid input(s): POCBNP D-Dimer: No results for input(s): DDIMER in the last 72 hours. Hemoglobin A1C: No results for input(s): HGBA1C in the last 72 hours. Fasting Lipid Panel: No results for input(s): CHOL, HDL, LDLCALC, TRIG, CHOLHDL, LDLDIRECT in the last 72 hours. Thyroid Function Tests: Recent Labs    03/17/18 1239  TSH  1.777   Anemia Panel: No results for input(s): VITAMINB12, FOLATE, FERRITIN, TIBC, IRON, RETICCTPCT in the last 72 hours.  Dg Chest 1 View  Result Date: 03/18/2018 CLINICAL DATA:  82 year old male with a history of hypoxia EXAM: CHEST  1 VIEW COMPARISON:  03/17/2018, 02/25/2018, 02/01/2018, 02/24/2018 FINDINGS: Cardiomediastinal silhouette unchanged in size and contour, partially obscured by overlying lung/pleural disease. Low lung volumes with crowding of the interstitium and the vasculature. Opacity at the left lung base obscuring the left hemidiaphragm and left heart border. Interposition of gas-filled colon subjacent to the right diaphragm. Surgical changes of the cervical region. No pneumothorax. Coarsened interstitial markings. IMPRESSION: Persistently low lung volumes with elevation the right hemidiaphragm and likely atelectasis. Interstitial pattern of opacity persists, and could represent chronic changes versus atypical infection. Opacity at the left lung base, potentially pleural fluid, atelectasis/consolidation. Interposition of the colon beneath the right hemidiaphragm. Cervical surgical changes. Electronically Signed   By: Gilmer MorJaime  Wagner D.O.   On: 03/18/2018 10:09     Echo LVEF 60 to 65%  TELEMETRY: Atrial fibrillation at 70 bpm:  ASSESSMENT AND PLAN:  Active Problems:   CHF (congestive heart failure) (HCC)  1.  Nonsustained WCT, probable atrial fibrillation with aberrancy 2.  Persistent atrial fibrillation, rate controlled, on Eliquis for stroke prevention 3.  Acute on chronic diastolic congestive heart failure, improved after diuresis  Recommendations  1.  Agree with current therapy 2.  Continue diuresis 3.  Carefully monitor renal status 4.  Continue Eliquis for stroke prevention 5.  Defer further cardiac diagnostics at this time  Sign off for now, please call if any questions   Marcina Millard, MD, PhD, [facility] 03/19/2018 9:06 AM

## 2018-03-19 NOTE — Progress Notes (Addendum)
SATURATION QUALIFICATIONS: (This note is used to comply with regulatory documentation for home oxygen)  Patient Saturations on Room Air at Rest = 78%  Patient Saturations on Room Air while Ambulating = n/a%  Patient Saturations on 3 Liters of oxygen while standing = 93%  Please briefly explain why patient needs home oxygen:  Unable to ambulate pt, pt too weak to walk. Md notiifed. Per sister pt wears 3 L  O2 chronically.

## 2018-03-20 LAB — CBC
HCT: 34.6 % — ABNORMAL LOW (ref 39.0–52.0)
Hemoglobin: 10.7 g/dL — ABNORMAL LOW (ref 13.0–17.0)
MCH: 29.6 pg (ref 26.0–34.0)
MCHC: 30.9 g/dL (ref 30.0–36.0)
MCV: 95.6 fL (ref 80.0–100.0)
Platelets: 155 10*3/uL (ref 150–400)
RBC: 3.62 MIL/uL — ABNORMAL LOW (ref 4.22–5.81)
RDW: 13.7 % (ref 11.5–15.5)
WBC: 5.5 10*3/uL (ref 4.0–10.5)
nRBC: 0 % (ref 0.0–0.2)

## 2018-03-20 LAB — BASIC METABOLIC PANEL
Anion gap: 7 (ref 5–15)
BUN: 24 mg/dL — ABNORMAL HIGH (ref 8–23)
CHLORIDE: 83 mmol/L — AB (ref 98–111)
CO2: 46 mmol/L — AB (ref 22–32)
Calcium: 8.6 mg/dL — ABNORMAL LOW (ref 8.9–10.3)
Creatinine, Ser: 0.65 mg/dL (ref 0.61–1.24)
GFR calc Af Amer: >60 mL/min (ref >60)
GFR calc non Af Amer: >60 mL/min (ref >60)
Glucose, Bld: 89 mg/dL (ref 70–99)
Potassium: 3.9 mmol/L (ref 3.5–5.1)
SODIUM: 136 mmol/L (ref 135–145)

## 2018-03-20 MED ORDER — MORPHINE SULFATE (CONCENTRATE) 20 MG/ML PO SOLN: 5.0000 mg | ORAL | 0 refills | Status: AC | PRN

## 2018-03-20 MED ORDER — FUROSEMIDE 40 MG PO TABS: 40.0000 mg | ORAL_TABLET | Freq: Every day | ORAL | Status: DC

## 2018-03-20 NOTE — Progress Notes (Signed)
Family Meeting Note  Advance Directive:yes  Today a meeting took place with the Patient and neice.   The following clinical team members were present during this meeting:MD  The following were discussed:Patient's diagnosis: CHF, respi failure, functional decline. , Patient's progosis: < 6 months and Goals for treatment: DNR  As per niece, they have family 24 hrs with him. He is mostly bed bound and need oxygen all the time. Pt had told niece that " I am ready to go home ( heaven)" She is requesting to keep him DNR and take him his home to be with family and requesting hospice services at home.  Additional follow-up to be provided: Hospice at home  Time spent during discussion:20 minutes  Scott DillingVaibhavkumar Bijon Mineer, MD

## 2018-03-20 NOTE — Progress Notes (Signed)
Scott Richard to be D/C'd Home with hospice per MD order.  Discussed prescriptions and follow up appointments with the patient, sister and niece. Prescriptions given to patient, medication list explained in detail. Pt verbalized understanding.  Allergies as of 03/20/2018   No Known Allergies     Medication List    TAKE these medications   acetaminophen 325 MG tablet Commonly known as:  TYLENOL Take 2 tablets (650 mg total) by mouth every 6 (six) hours as needed for mild pain or headache.   albuterol 108 (90 Base) MCG/ACT inhaler Commonly known as:  PROVENTIL HFA;VENTOLIN HFA Inhale 1-2 puffs into the lungs every 6 (six) hours as needed for wheezing or shortness of breath.   apixaban 5 MG Tabs tablet Commonly known as:  ELIQUIS Take 1 tablet (5 mg total) by mouth 2 (two) times daily.   carvedilol 3.125 MG tablet Commonly known as:  COREG Take 1 tablet (3.125 mg total) by mouth 2 (two) times daily with a meal. What changed:  when to take this   cetirizine 10 MG tablet Commonly known as:  ZYRTEC Take 10 mg by mouth daily.   fluticasone 50 MCG/ACT nasal spray Commonly known as:  FLONASE Place 2 sprays into both nostrils daily. What changed:  when to take this   furosemide 20 MG tablet Commonly known as:  LASIX Take 2 tablets (40 mg total) by mouth daily.   lactose free nutrition Liqd Take 237 mLs by mouth 3 (three) times daily with meals.   lisinopril 2.5 MG tablet Commonly known as:  PRINIVIL,ZESTRIL Take 1 tablet (2.5 mg total) by mouth daily.   morphine 20 MG/ML concentrated solution Commonly known as:  ROXANOL Take 0.25 mLs (5 mg total) by mouth every 2 (two) hours as needed for severe pain, anxiety or shortness of breath.   multivitamin capsule Take by mouth.   OXYGEN Inhale 2 L into the lungs continuous.   Potassium Chloride ER 20 MEQ Tbcr Take 20 mEq by mouth daily.   pramipexole 0.5 MG tablet Commonly known as:  MIRAPEX Take 1 tablet (0.5 mg total)  by mouth 3 (three) times daily.   SYMBICORT 160-4.5 MCG/ACT inhaler Generic drug:  budesonide-formoterol Inhale 2 puffs into the lungs 2 (two) times daily.   VITAMIN D-1000 MAX ST 25 MCG (1000 UT) tablet Generic drug:  Cholecalciferol Take 1 tablet by mouth daily.       Vitals:   03/20/18 0300 03/20/18 0809  BP: 110/62 133/64  Pulse: 60 60  Resp: 17 20  Temp: 98.6 F (37 C) 98.2 F (36.8 C)  SpO2: 97% 100%    Tele box removed and returned.Skin clean, dry and intact without evidence of skin break down, no evidence of skin tears noted. IV catheter discontinued intact. Site without signs and symptoms of complications. Dressing and pressure applied. Pt denies pain at this time. No complaints noted.  An After Visit Summary was printed and given to the patient. Patient escorted via WC, and D/C home via private auto. Sister will be transporting pt home.  Scott NoelErica Y Margarite Vessel

## 2018-03-20 NOTE — Plan of Care (Signed)
  Problem: Elimination: Goal: Will not experience complications related to bowel motility Outcome: Progressing   Problem: Elimination: Goal: Will not experience complications related to urinary retention Outcome: Progressing   Problem: Safety: Goal: Ability to remain free from injury will improve Outcome: Progressing   Problem: Skin Integrity: Goal: Risk for impaired skin integrity will decrease Outcome: Progressing  Patient sacrum foam changed

## 2018-03-20 NOTE — Care Management Note (Signed)
Case Management Note  Patient Details  Name: Scott EasterlyWilbur G Oliff MRN: [id number] Date of Birth: 12/12/1935  Subjective/Objective:   Md informed RNCM team that patient and family would like to go home with hospice care. Patient currently has home health but has not progressed and feels he is suffering. Family and patient agreeable to going home with hospice and feel comfortable being at home together and all family plans to stay with the patient until he ultimately passes. Patient has wheelchair, bedside commode and walker in home. Per family may eventually require a hospital bed. Currently on chronic O2 and has equipment but will transition to equipment with hospice. They are familiar with [facility] and prefer to use them. [facility] Faxed all needed info to Hospice intake.              Action/Plan:   Expected Discharge Date:  03/20/18               Expected Discharge Plan:  Home w Hospice Care  In-House Referral:     Discharge planning Services  CM Consult, HF Clinic  Post Acute Care Choice:  Resumption of Svcs/PTA Provider Choice offered to:  Patient  DME Arranged:    DME Agency:     HH Arranged:    HH Agency:  [facility]  Status of Service:  Completed, signed off  If discussed at Long Length of Stay Meetings, dates discussed:    Additional Comments:  Virgel ManifoldJosh A Quay Simkin, RN 03/20/2018, 1:38 PM

## 2018-03-20 NOTE — Discharge Instructions (Signed)
Hospice care at home. °

## 2018-03-20 NOTE — Evaluation (Signed)
Physical Therapy Evaluation Patient Details Name: Scott Richard MRN: [id number] DOB: July 14, 1935 Today's Date: 03/20/2018   History of Present Illness  82 yo male with onset of CHF and SOB with COPD, atelectasis, and weakness was admitted.  He is going home with palliative/hospice care and referred to PT to check safety with mobility.  PMHx:  COPD, CHF, HTN, CAD, 3L O2 baseline  Clinical Impression  Pt is demonstrating a level of ability that is close to baseline, but also is potentially getting a hospice vs palliative consult for home.  Has tolerated gait and transfers with fairly safe mobility, using O2 and belt with family in attendance. Follow up at home, will not continue with PT in this setting as rehab is not requested to follow at home.  Discharging PT for now.    Follow Up Recommendations Supervision for mobility/OOB;Supervision/Assistance - 24 hour    Equipment Recommendations  None recommended by PT    Recommendations for Other Services       Precautions / Restrictions Precautions Precautions: Fall(telemetry) Precaution Comments: gait belt with all mobility Restrictions Weight Bearing Restrictions: No      Mobility  Bed Mobility Overal bed mobility: Needs Assistance;Modified Independent Bed Mobility: Supine to Sit;Sit to Supine     Supine to sit: Min assist Sit to supine: Min assist   General bed mobility comments: using bed rail and maneuvering carefully  Transfers Overall transfer level: Needs assistance Equipment used: Rolling walker (2 wheeled);1 person hand held assist Transfers: Sit to/from Stand Sit to Stand: Min assist            Ambulation/Gait Ambulation/Gait assistance: Min guard;Min assist Gait Distance (Feet): 20 Feet Assistive device: Rolling walker (2 wheeled);1 person hand held assist Gait Pattern/deviations: Step-through pattern;Wide base of support;Decreased stride length Gait velocity: reduced Gait velocity interpretation: <1.31  ft/sec, indicative of household ambulator General Gait Details: gait belt with cues for family to assist, talked about buckling on RLE  Stairs            Wheelchair Mobility    Modified Rankin (Stroke Patients Only)       Balance Overall balance assessment: Needs assistance Sitting-balance support: Feet supported Sitting balance-Leahy Scale: Fair     Standing balance support: Bilateral upper extremity supported;During functional activity Standing balance-Leahy Scale: Poor                               Pertinent Vitals/Pain Pain Assessment: No/denies pain    Home Living Family/patient expects to be discharged to:: Private residence Living Arrangements: Other relatives(sister, niece) Available Help at Discharge: Family Type of Home: Mobile home Home Access: Ramped entrance     Home Layout: One level Home Equipment: Environmental consultant - 2 wheels;Wheelchair - power;Hospital bed      Prior Function Level of Independence: Needs assistance   Gait / Transfers Assistance Needed: RW with supervision at home  ADL's / Homemaking Assistance Needed: family assist to perform housework  Comments: RW and O2 in home, previously using power chair in community, but not mentioned this admission     Hand Dominance   Dominant Hand: Right    Extremity/Trunk Assessment   Upper Extremity Assessment Upper Extremity Assessment: Generalized weakness    Lower Extremity Assessment Lower Extremity Assessment: Generalized weakness(strength is 4+ but RLE buckling with gait)    Cervical / Trunk Assessment Cervical / Trunk Assessment: Kyphotic  Communication   Communication: Expressive difficulties  Cognition Arousal/Alertness: Awake/alert  Behavior During Therapy: WFL for tasks assessed/performed Overall Cognitive Status: Within Functional Limits for tasks assessed                                        General Comments General comments (skin integrity, edema,  etc.): On 3L O2 with unsteady standing but family available to instruct    Exercises     Assessment/Plan    PT Assessment Patient needs continued PT services  PT Problem List Decreased strength;Decreased range of motion;Decreased activity tolerance;Decreased balance;Decreased mobility;Decreased coordination;Decreased knowledge of use of DME;Cardiopulmonary status limiting activity       PT Treatment Interventions DME instruction;Gait training;Functional mobility training;Therapeutic activities;Therapeutic exercise;Balance training;Neuromuscular re-education;Patient/family education    PT Goals (Current goals can be found in the Care Plan section)  Acute Rehab PT Goals Patient Stated Goal: go home again PT Goal Formulation: With patient/family Time For Goal Achievement: 03/27/18 Potential to Achieve Goals: Fair    Frequency Min 2X/week   Barriers to discharge   home with family and ramp to enter house    Co-evaluation               AM-PAC PT "6 Clicks" Mobility  Outcome Measure Help needed turning from your back to your side while in a flat bed without using bedrails?: A Little Help needed moving from lying on your back to sitting on the side of a flat bed without using bedrails?: A Little Help needed moving to and from a bed to a chair (including a wheelchair)?: A Little Help needed standing up from a chair using your arms (e.g., wheelchair or bedside chair)?: A Little Help needed to walk in hospital room?: A Little Help needed climbing 3-5 steps with a railing? : A Little 6 Click Score: 18    End of Session Equipment Utilized During Treatment: Oxygen;Gait belt Activity Tolerance: Patient limited by fatigue Patient left: in bed;with call bell/phone within reach;with bed alarm set;with family/visitor present Nurse Communication: Mobility status PT Visit Diagnosis: Unsteadiness on feet (R26.81);Muscle weakness (generalized) (M62.81);Difficulty in walking, not elsewhere  classified (R26.2)    Time: [id number] PT Time Calculation (min) (ACUTE ONLY): 20 min   Charges:   PT Evaluation $PT Eval Moderate Complexity: 1 Mod         Ivar DrapeRuth E Frankey Botting 03/20/2018, 1:00 PM   Samul Dadauth Jacalyn Biggs, PT MS Acute Rehab Dept. Number: [facility] [phone] and [facility] [phone]

## 2018-03-20 NOTE — Discharge Summary (Signed)
[facility] -  at [facility]   PATIENT NAME: Scott Richard    MR#:  [id number]  DATE OF BIRTH:  1935-06-20  DATE OF ADMISSION:  03/17/2018 ADMITTING PHYSICIAN: Delfino Lovett, MD  DATE OF DISCHARGE: 03/20/2018   PRIMARY CARE PHYSICIAN: Lyndon Code, MD    ADMISSION DIAGNOSIS:  Shortness of breath [R06.02] HCAP (healthcare-associated pneumonia) [J18.9] Congestive heart failure, unspecified HF chronicity, unspecified heart failure type (HCC) [I50.9]  DISCHARGE DIAGNOSIS:  Active Problems:   CHF (congestive heart failure) (HCC)   SECONDARY DIAGNOSIS:   Past Medical History:  Diagnosis Date  . Alcohol use   . Arthritis   . Atrial fibrillation (HCC)   . CHF (congestive heart failure) (HCC)   . COPD (chronic obstructive pulmonary disease) (HCC)   . Coronary artery disease   . Hyperlipidemia   . Hypertension   . Stroke [facility])     HOSPITAL COURSE:   * Acute on chronic hypoxic resp failure -  due to CHF exacerbation -On supplemental oxygen at home and uses BiPAP at night, continue that. -Back to baseline at 2-3 L nasal cannula oxygen now.  * Acute on chronic diastolic CHF - Echo in 10/19 showed EF 60-65% - Diuresis with lasix - strict I & Os, daily weight  Appreciated cardio consult, cont same meds.   Improved, Back to baseline now as per niece in room now.  *Left buttock with stage 2 pressure injury - wound care c/s  * A.fib: - continue Eliquis for anticoagulation - Coreg for rate control   DISCHARGE CONDITIONS:   Stable.  CONSULTS OBTAINED:  Treatment Team:  Marcina Millard, MD  DRUG ALLERGIES:  No Known Allergies  DISCHARGE MEDICATIONS:   Allergies as of 03/20/2018   No Known Allergies     Medication List    TAKE these medications   acetaminophen 325 MG tablet Commonly known as:  TYLENOL Take 2 tablets (650 mg total) by mouth every 6 (six) hours as needed for mild pain or headache.   albuterol 108  (90 Base) MCG/ACT inhaler Commonly known as:  PROVENTIL HFA;VENTOLIN HFA Inhale 1-2 puffs into the lungs every 6 (six) hours as needed for wheezing or shortness of breath.   apixaban 5 MG Tabs tablet Commonly known as:  ELIQUIS Take 1 tablet (5 mg total) by mouth 2 (two) times daily.   carvedilol 3.125 MG tablet Commonly known as:  COREG Take 1 tablet (3.125 mg total) by mouth 2 (two) times daily with a meal. What changed:  when to take this   cetirizine 10 MG tablet Commonly known as:  ZYRTEC Take 10 mg by mouth daily.   fluticasone 50 MCG/ACT nasal spray Commonly known as:  FLONASE Place 2 sprays into both nostrils daily. What changed:  when to take this   furosemide 20 MG tablet Commonly known as:  LASIX Take 2 tablets (40 mg total) by mouth daily.   lactose free nutrition Liqd Take 237 mLs by mouth 3 (three) times daily with meals.   lisinopril 2.5 MG tablet Commonly known as:  PRINIVIL,ZESTRIL Take 1 tablet (2.5 mg total) by mouth daily.   morphine 20 MG/ML concentrated solution Commonly known as:  ROXANOL Take 0.25 mLs (5 mg total) by mouth every 2 (two) hours as needed for severe pain, anxiety or shortness of breath.   multivitamin capsule Take by mouth.   OXYGEN Inhale 2 L into the lungs continuous.   Potassium Chloride ER 20 MEQ Tbcr Take 20 mEq by  mouth daily.   pramipexole 0.5 MG tablet Commonly known as:  MIRAPEX Take 1 tablet (0.5 mg total) by mouth 3 (three) times daily.   SYMBICORT 160-4.5 MCG/ACT inhaler Generic drug:  budesonide-formoterol Inhale 2 puffs into the lungs 2 (two) times daily.   VITAMIN D-1000 MAX ST 25 MCG (1000 UT) tablet Generic drug:  Cholecalciferol Take 1 tablet by mouth daily.        DISCHARGE INSTRUCTIONS:    Hospice to follow at home.  If you experience worsening of your admission symptoms, develop shortness of breath, life threatening emergency, suicidal or homicidal thoughts you must seek medical attention  immediately by calling 911 or calling your MD immediately  if symptoms less severe.  You Must read complete instructions/literature along with all the possible adverse reactions/side effects for all the Medicines you take and that have been prescribed to you. Take any new Medicines after you have completely understood and accept all the possible adverse reactions/side effects.   Please note  You were cared for by a hospitalist during your hospital stay. If you have any questions about your discharge medications or the care you received while you were in the hospital after you are discharged, you can call the unit and asked to speak with the hospitalist on call if the hospitalist that took care of you is not available. Once you are discharged, your primary care physician will handle any further medical issues. Please note that NO REFILLS for any discharge medications will be authorized once you are discharged, as it is imperative that you return to your primary care physician (or establish a relationship with a primary care physician if you do not have one) for your aftercare needs so that they can reassess your need for medications and monitor your lab values.    Today   CHIEF COMPLAINT:   Chief Complaint  Patient presents with  . Shortness of Breath    HISTORY OF PRESENT ILLNESS:  Scott Richard  is a 82 y.o. male with a known history of COPD is being admitted for worsening SOB. His breathing started getting worse yesterday. Normally on 2L at home but they have had to increase his oxygen. He had associated cough productive of white/clear phlegm. Also has swollen lower extremities which are worsening.   VITAL SIGNS:  Blood pressure 133/64, pulse 60, temperature 98.2 F (36.8 C), temperature source Oral, resp. rate 20, height 5\' 6"  (1.676 m), weight 66.2 kg, SpO2 100 %.  I/O:    Intake/Output Summary (Last 24 hours) at 03/20/2018 1201 Last data filed at 03/20/2018 1012 Gross per 24 hour   Intake 600 ml  Output 0 ml  Net 600 ml    PHYSICAL EXAMINATION:   GENERAL:  82 y.o.-year-old patient lying in the bed with no acute distress.  EYES: Pupils equal, round, reactive to light and accommodation. No scleral icterus. Extraocular muscles intact.  HEENT: Head atraumatic, normocephalic. Oropharynx and nasopharynx clear.  NECK:  Supple, no jugular venous distention. No thyroid enlargement, no tenderness.  LUNGS: Normal breath sounds bilaterally, no wheezing, bilateral crepitation. No use of accessory muscles of respiration.  CARDIOVASCULAR: S1, S2 normal. No murmurs, rubs, or gallops.  ABDOMEN: Soft, nontender, nondistended. Bowel sounds present. No organomegaly or mass.  EXTREMITIES: No pedal edema, cyanosis, or clubbing.  NEUROLOGIC: Cranial nerves II through XII are intact. Muscle strength 3-4/5 in all extremities. Sensation intact. Gait not checked.  PSYCHIATRIC: The patient is alert and oriented x 3.  SKIN: No obvious rash, lesion,  or ulcer.   DATA REVIEW:   CBC Recent Labs  Lab 03/20/18 0322  WBC 5.5  HGB 10.7*  HCT 34.6*  PLT 155    Chemistries  Recent Labs  Lab 03/17/18 1239  03/20/18 0322  NA  --    < > 136  K  --    < > 3.9  CL  --    < > 83*  CO2  --    < > 46*  GLUCOSE  --    < > 89  BUN  --    < > 24*  CREATININE  --    < > 0.65  CALCIUM  --    < > 8.6*  MG 1.7  --   --    < > = values in this interval not displayed.    Cardiac Enzymes Recent Labs  Lab 03/17/18 2014  TROPONINI <0.03    Microbiology Results  Results for orders placed or performed during the hospital encounter of 03/17/18  Blood culture (routine x 2)     Status: None (Preliminary result)   Collection Time: 03/17/18  8:11 AM  Result Value Ref Range Status   Specimen Description BLOOD BLOOD LEFT FOREARM  Final   Special Requests   Final    BOTTLES DRAWN AEROBIC AND ANAEROBIC Blood Culture results may not be optimal due to an excessive volume of blood received in culture  bottles   Culture   Final    NO GROWTH 3 DAYS Performed at [facility], [address] [place], [place] [address]    Report Status PENDING  Incomplete  Blood culture (routine x 2)     Status: None (Preliminary result)   Collection Time: 03/17/18  8:11 AM  Result Value Ref Range Status   Specimen Description BLOOD LEFT ANTECUBITAL  Final   Special Requests   Final    BOTTLES DRAWN AEROBIC AND ANAEROBIC Blood Culture adequate volume   Culture   Final    NO GROWTH 3 DAYS Performed at [facility], [address], [place], [place] [address]    Report Status PENDING  Incomplete    RADIOLOGY:  No results found.  EKG:   Orders placed or performed during the hospital encounter of 03/17/18  . ED EKG within 10 minutes  . ED EKG within 10 minutes  . EKG 12-Lead  . EKG 12-Lead      Management plans discussed with the patient, family and they are in agreement.  CODE STATUS: DNR    Code Status Orders  (From admission, onward)         Start     Ordered   03/18/18 1715  Full code  Continuous     03/18/18 1714        Code Status History    Date Active Date Inactive Code Status Order ID Comments User Context   03/17/2018 1003 03/18/2018 1714 DNR [id number]  Delfino Lovett, MD ED   02/24/2018 1658 02/27/2018 2047 DNR [id number]  Eugenie Norrie, NP Inpatient   02/24/2018 0835 02/24/2018 1658 Full Code [id number]  Arnaldo Natal, MD Inpatient   02/01/2018 1735 02/04/2018 1822 Full Code [id number]  Salary, Evelena Asa, MD Inpatient   06/17/2016 2320 06/22/2016 1628 Full Code [id number]  Varughese, Daiva Eves, NP ED      TOTAL TIME TAKING CARE OF THIS PATIENT: 40 minutes.    Altamese Dilling M.D on 03/20/2018 at 12:01 PM  Between 7am to 6pm - Pager -  [phone]  After 6pm go to [email or web address] - password [email or web address] [facility]  [facility]  Office  [phone]  CC: Primary care physician; Lyndon Code, MD   Note: This dictation was prepared with  Dragon dictation along with smaller phrase technology. Any transcriptional errors that result from this process are unintentional.

## 2018-03-21 ENCOUNTER — Telehealth: Payer: Self-pay

## 2018-03-21 NOTE — Telephone Encounter (Signed)
Gave  Verbal order crystal for hospice in home

## 2018-03-22 LAB — CULTURE, BLOOD (ROUTINE X 2)
Culture: NO GROWTH
Culture: NO GROWTH
Special Requests: ADEQUATE

## 2018-03-24 ENCOUNTER — Other Ambulatory Visit: Payer: Self-pay

## 2018-03-24 MED ORDER — IPRATROPIUM-ALBUTEROL 0.5-2.5 (3) MG/3ML IN SOLN: 3.0000 mL | Freq: Four times a day (QID) | RESPIRATORY_TRACT | 2 refills | Status: AC

## 2018-03-28 ENCOUNTER — Ambulatory Visit: Payer: Self-pay | Admitting: Family

## 2018-03-30 ENCOUNTER — Other Ambulatory Visit: Payer: Self-pay

## 2018-03-30 MED ORDER — POTASSIUM CHLORIDE ER 10 MEQ PO TBCR: EXTENDED_RELEASE_TABLET | ORAL | 2 refills | Status: AC

## 2018-04-13 DEATH — deceased

## 2018-04-14 DIAGNOSIS — I5022 Chronic systolic (congestive) heart failure: Secondary | ICD-10-CM | POA: Diagnosis not present

## 2018-04-14 DIAGNOSIS — J9611 Chronic respiratory failure with hypoxia: Secondary | ICD-10-CM | POA: Diagnosis not present

## 2018-04-14 DIAGNOSIS — R262 Difficulty in walking, not elsewhere classified: Secondary | ICD-10-CM | POA: Diagnosis not present

## 2018-04-14 DIAGNOSIS — R531 Weakness: Secondary | ICD-10-CM | POA: Diagnosis not present

## 2018-06-08 ENCOUNTER — Ambulatory Visit: Payer: Self-pay | Admitting: Adult Health

## 2019-01-16 ENCOUNTER — Ambulatory Visit: Payer: Self-pay | Admitting: Adult Health

## 2019-01-17 IMAGING — DX DG CHEST 1V
1 series · 1 of 1 positions shown · non-contrast
Comparison: Chest radiograph December 25, 2011

CLINICAL DATA: Increasing shortness of breath, coughing and
congestion for 3 days. Fall. History of COPD.

EXAM:
CHEST 1 VIEW

[chest ap]
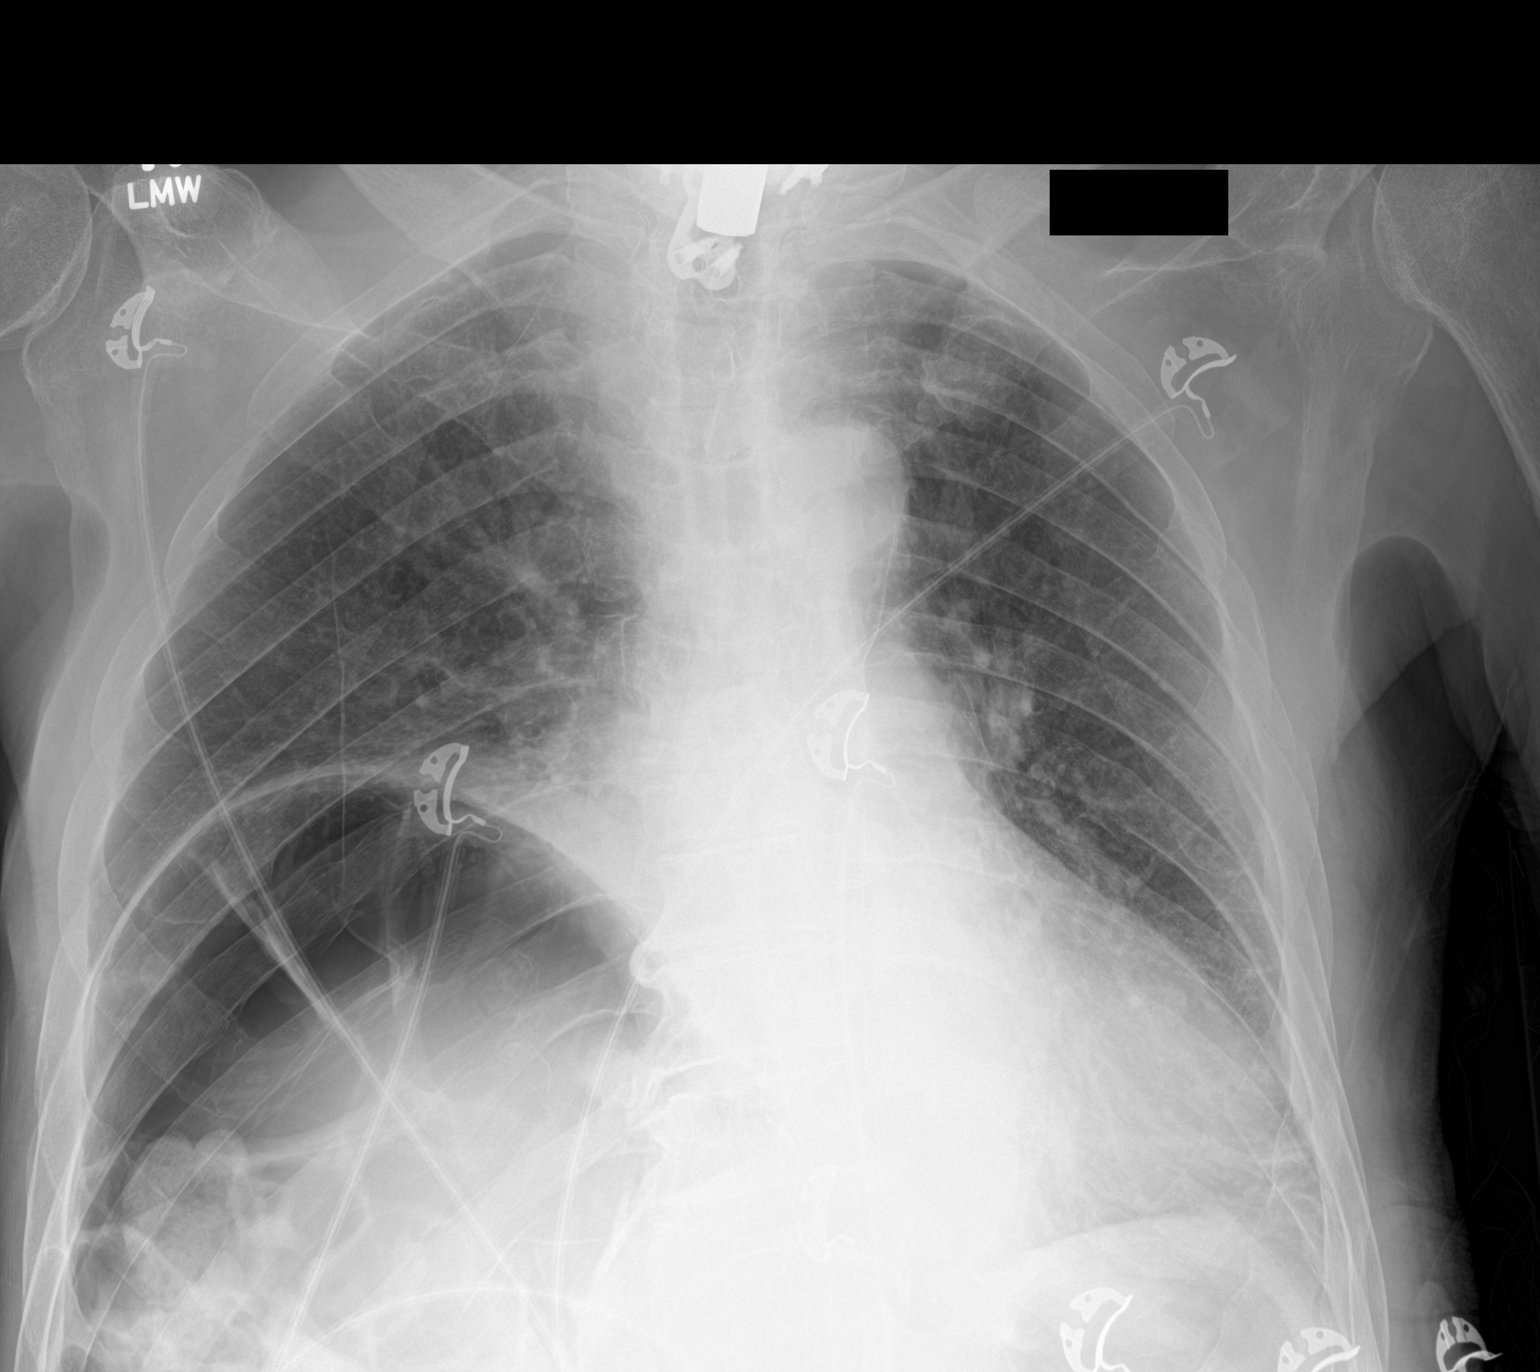

[1 of 1 positions shown; findings below may reference images not displayed]

FINDINGS: Cardiac silhouette is mildly enlarged and unchanged. Mediastinal
silhouette is nonsuspicious, mildly calcified aortic knob. Diffuse
interstitial prominence with LEFT lung base strandy densities.
Elevated RIGHT hemidiaphragm is unchanged with gas distended colonic
intra positioning. No pneumothorax. Nondisplaced LEFT fifth rib
fracture may be acute. Old RIGHT rib fractures. ACDF and facet
screws. Osteopenia.
IMPRESSION: LEFT fifth rib fracture may be acute.  Old RIGHT rib fractures.

Stable cardiomegaly, increasing interstitial prominence concerning
for atypical infection or pulmonary edema.

Bibasilar atelectasis.

## 2019-02-05 IMAGING — CR DG CHEST 2V
1 series · 2 of 2 positions shown · non-contrast
Comparison: Chest x-ray of June 17, 2016

CLINICAL DATA: Episode of pneumonia 2 weeks ago, follow-up study.
Persistent weakness, cough, and shortness of breath. History of
COPD, current smoker.

EXAM:
CHEST  2 VIEW

[Series 1: dg chest 2 view · 0.14mm/px · 2 of 2 slices shown]
[im 1/2]
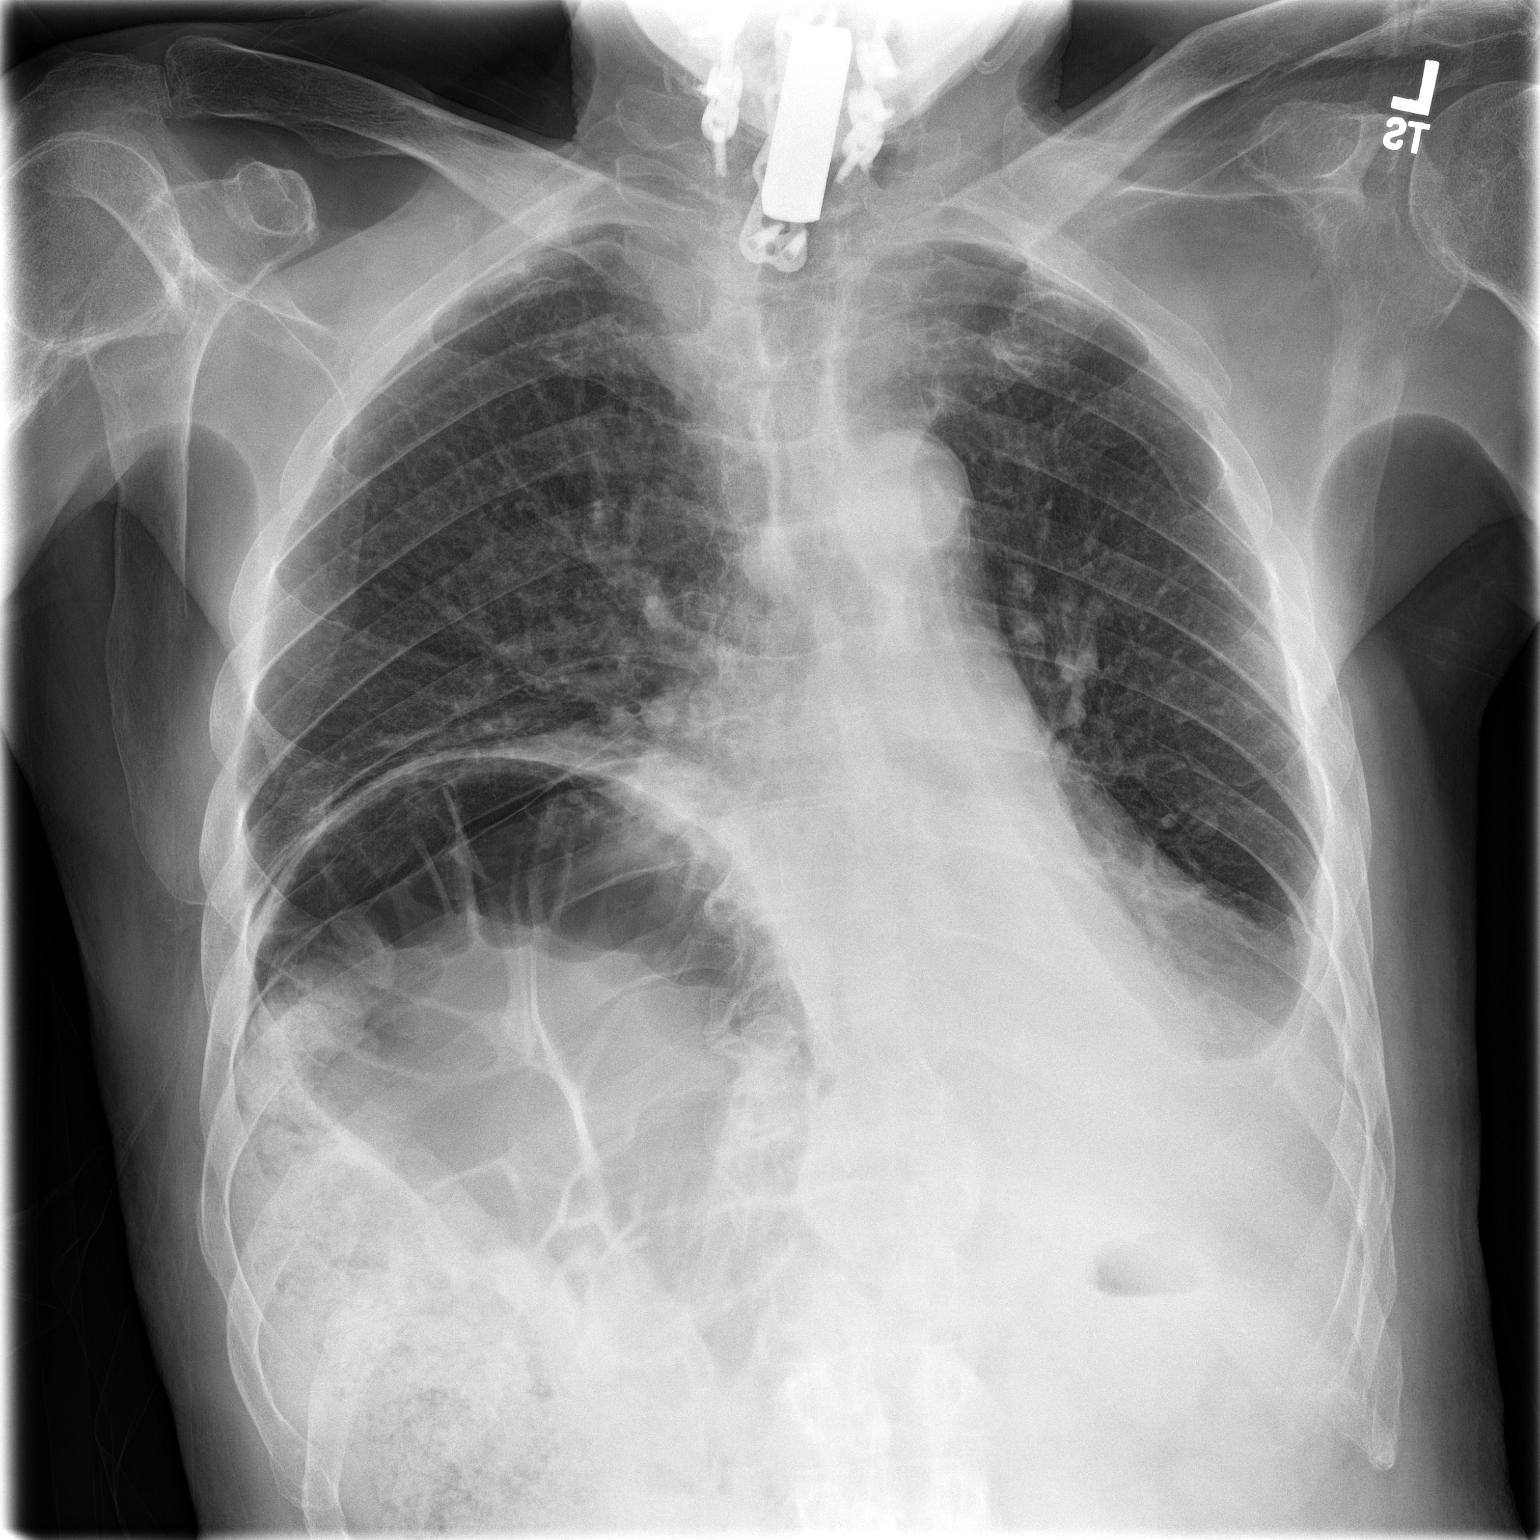
[im 2/2]
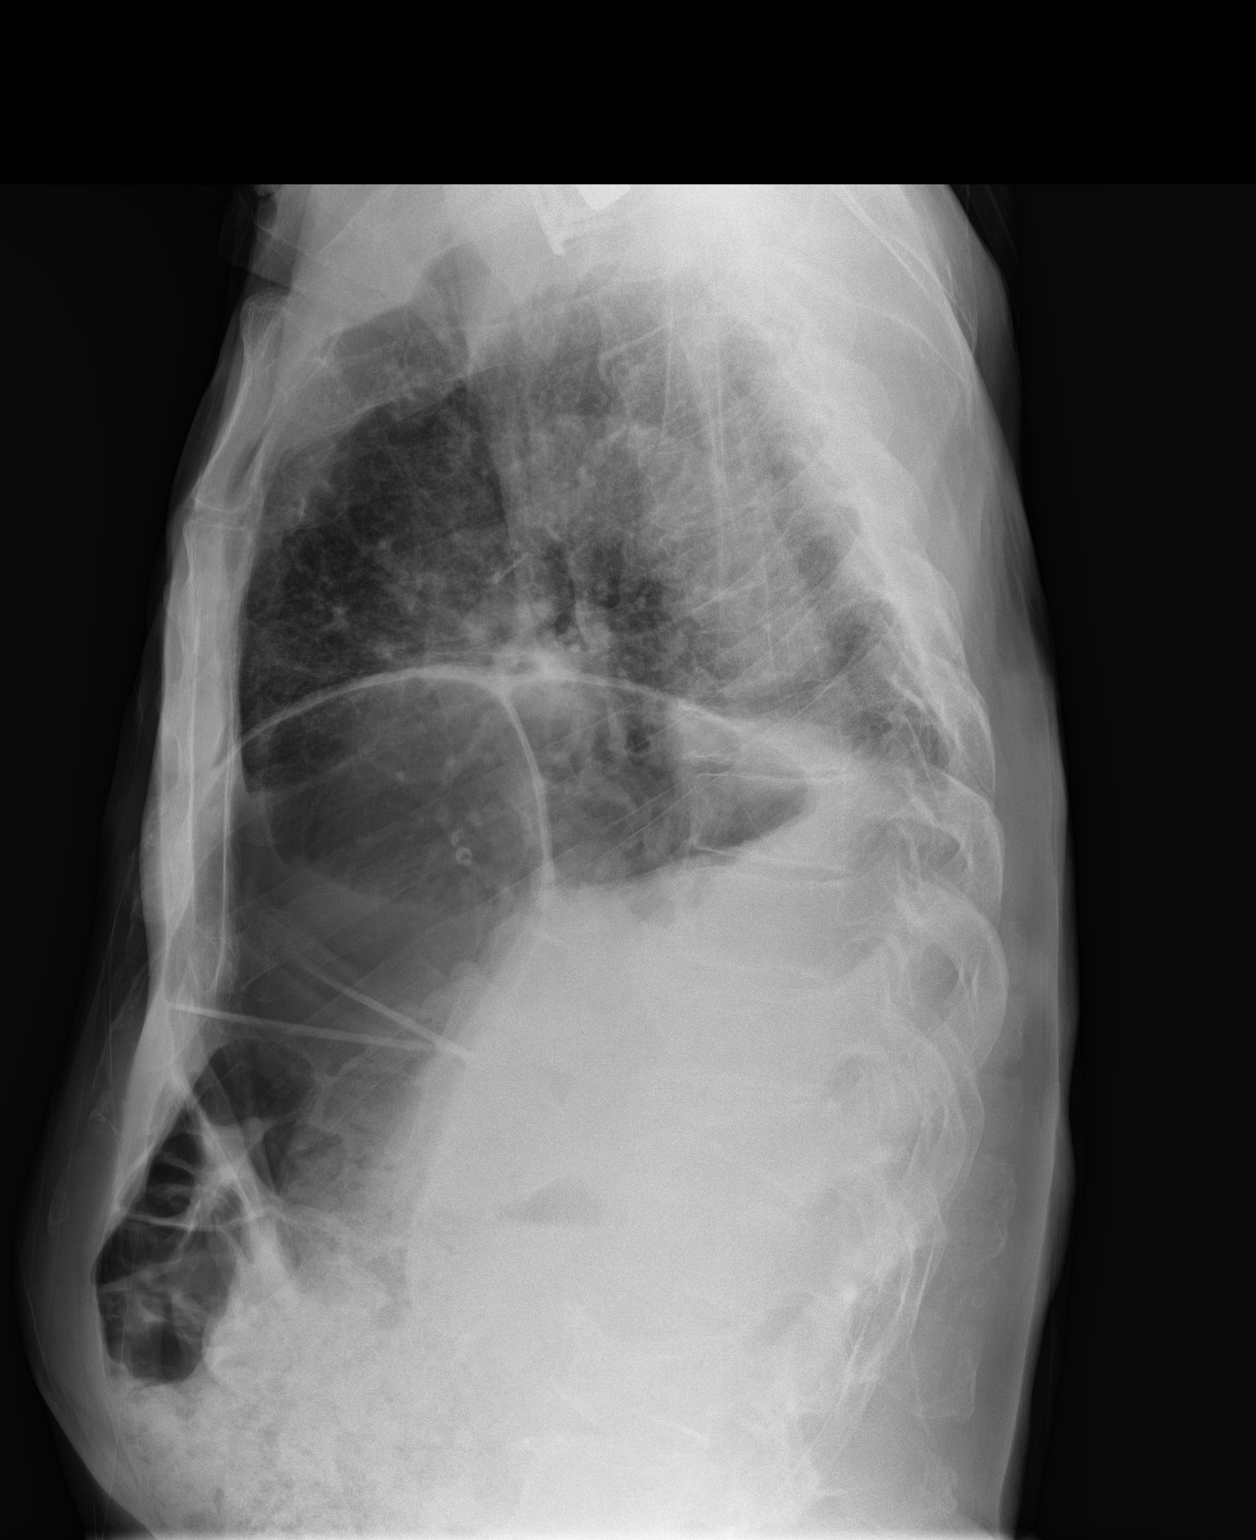

[2 of 2 positions shown; findings below may reference images not displayed]

FINDINGS: There remains elevation of the right hemidiaphragm with
interposition of a loop of bowel between the hemidiaphragm and the
dome of the liver. There is increased density at the left lung base
consistent with atelectasis with new small left pleural effusion.
The heart is not enlarged. The pulmonary vascularity is not
engorged. There is calcification in the wall of the aortic arch.
IMPRESSION: Increasing atelectasis or infiltrate at the left lung base with new
small left pleural effusion. Followup PA and lateral chest X-ray is
recommended in 3-4 weeks following trial of antibiotic therapy to
ensure resolution and exclude underlying malignancy.

## 2019-03-29 IMAGING — US US SCROTUM
1 series · 14 of 25 positions shown · non-contrast
Comparison: None.

CLINICAL DATA: Swollen left scrotum for 6 years.

EXAM:
ULTRASOUND OF SCROTUM
TECHNIQUE: Complete ultrasound examination of the testicles, epididymis, and
other scrotal structures was performed.

[Series 1: us scrotum · 0.13mm/px · 14 of 78 slices shown]
[im 1/78]
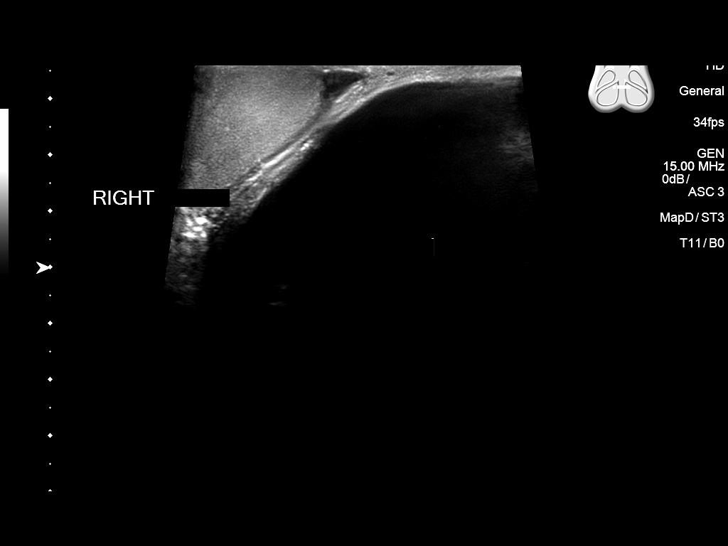
[im 7/78]
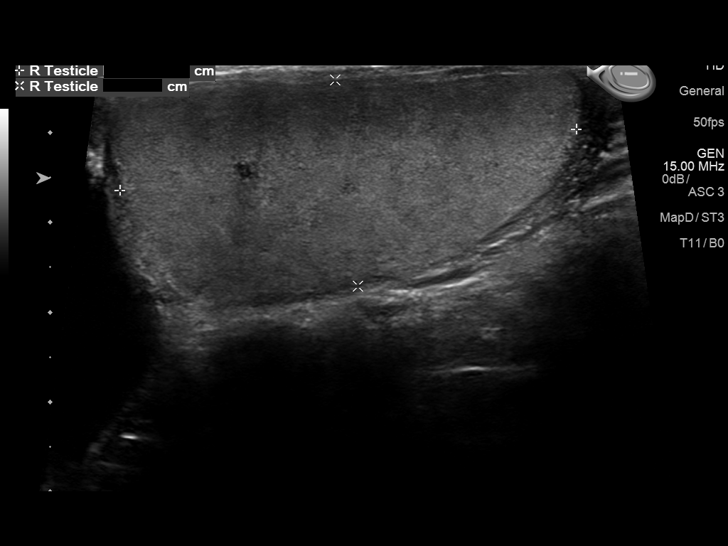
[im 13/78]
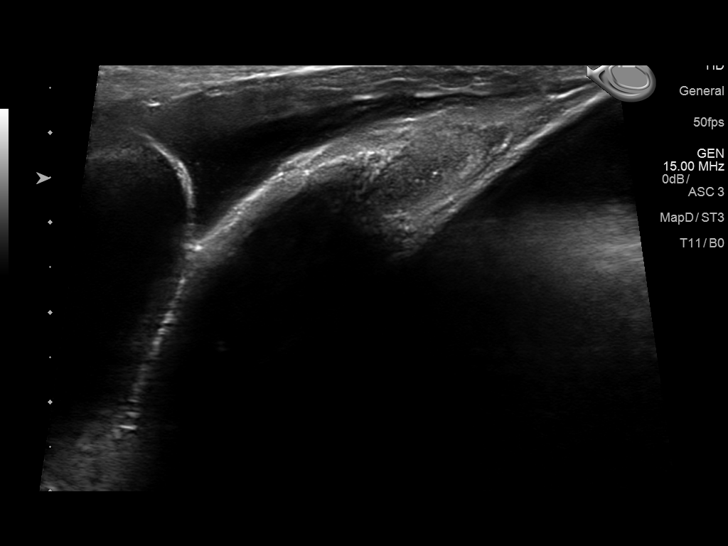
[im 20/78]
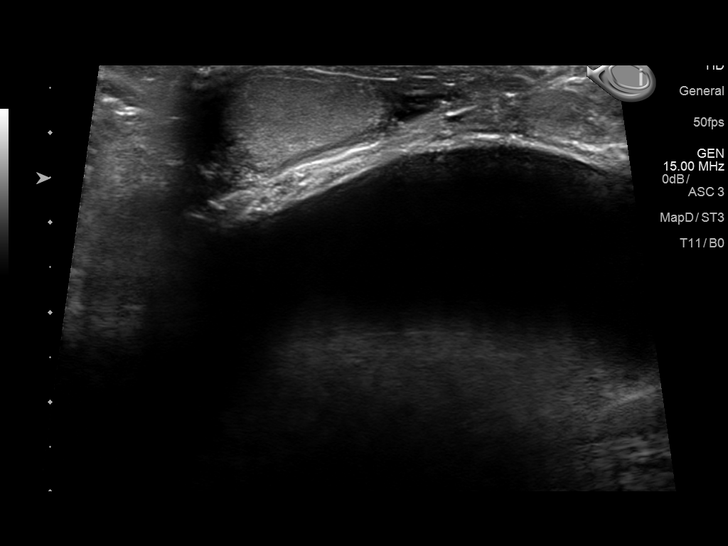
[im 26/78]
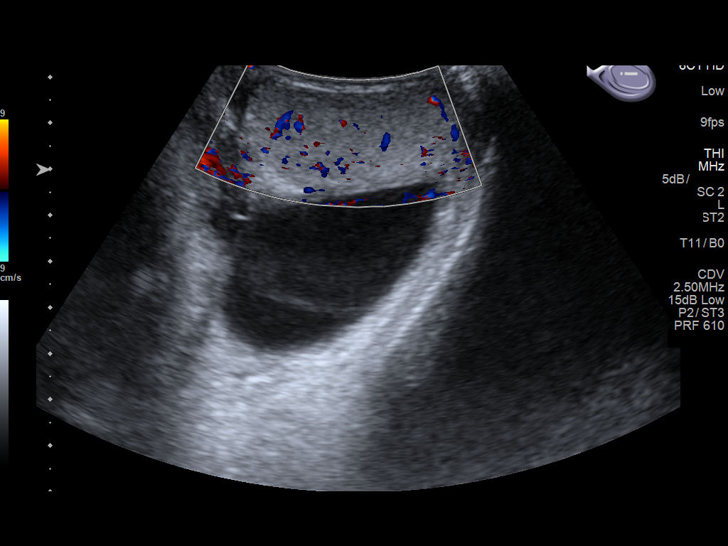
[im 29/78]
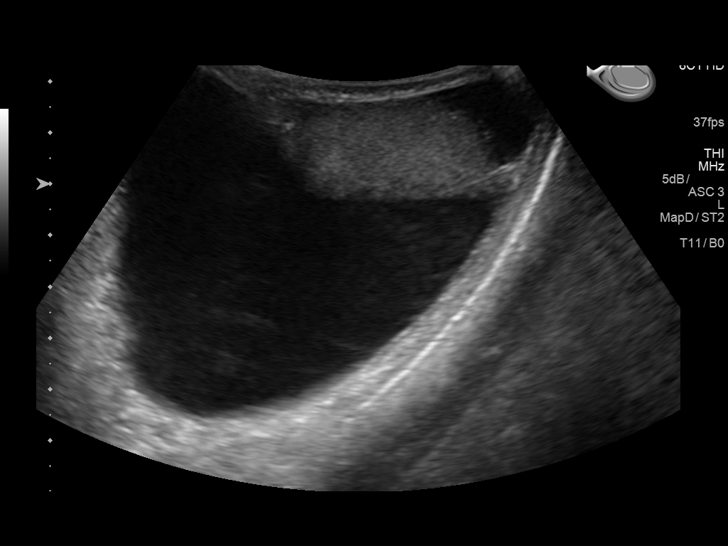
[im 36/78]
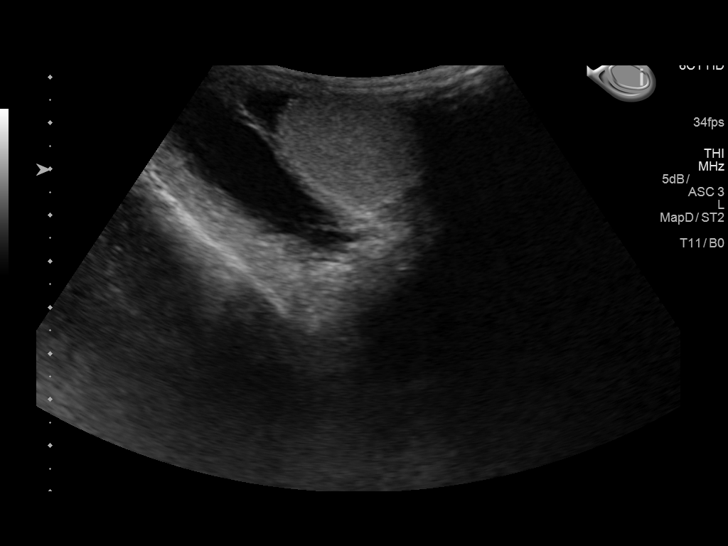
[im 42/78]
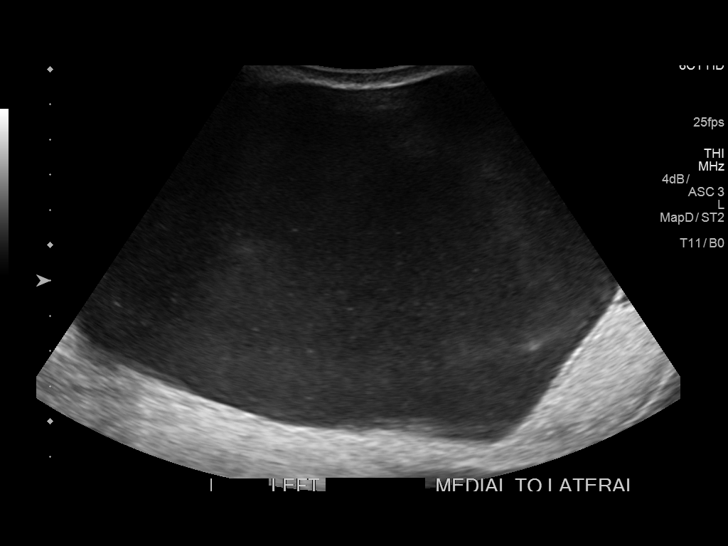
[im 49/78]
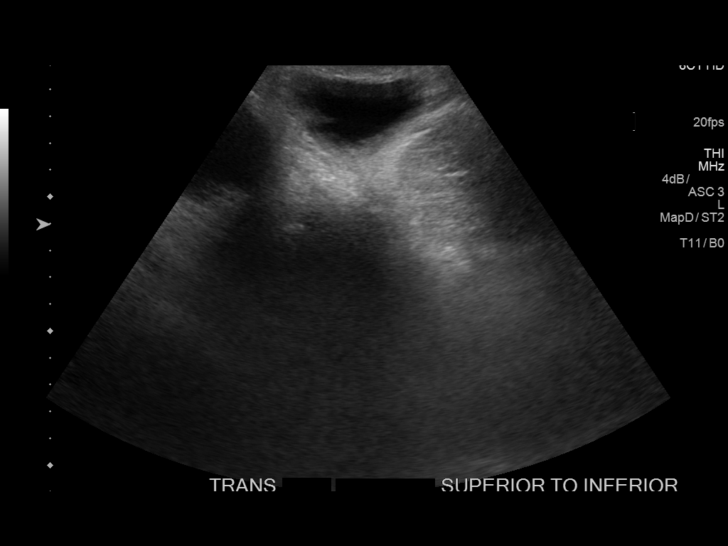
[im 52/78]
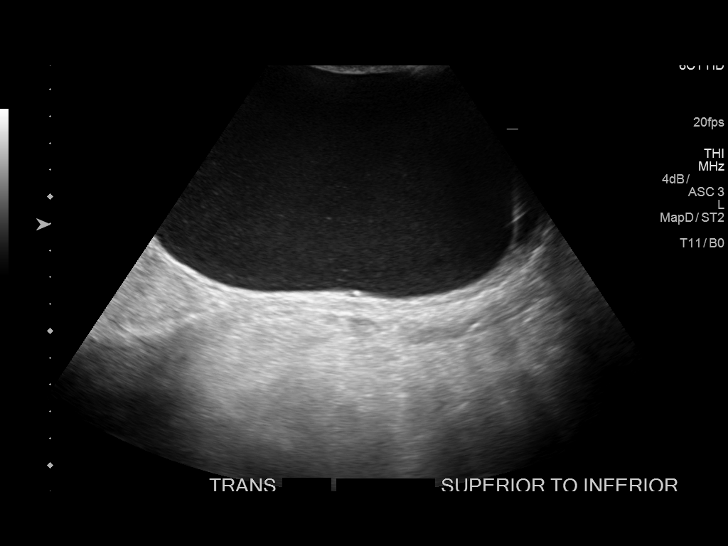
[im 58/78]
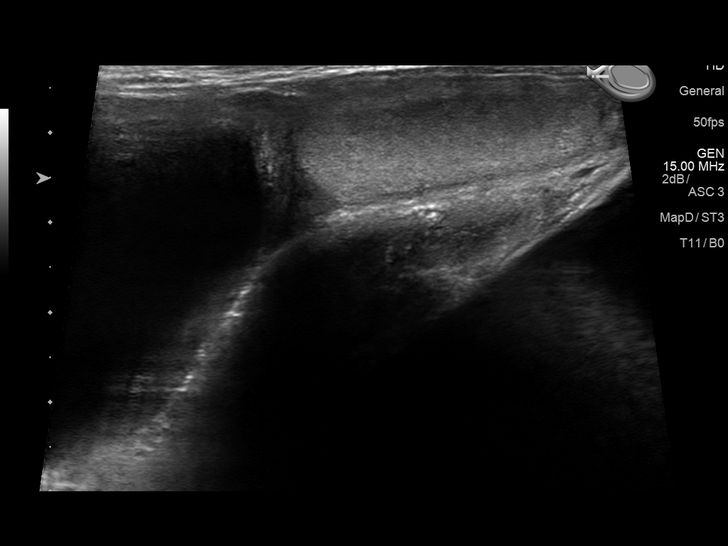
[im 65/78]
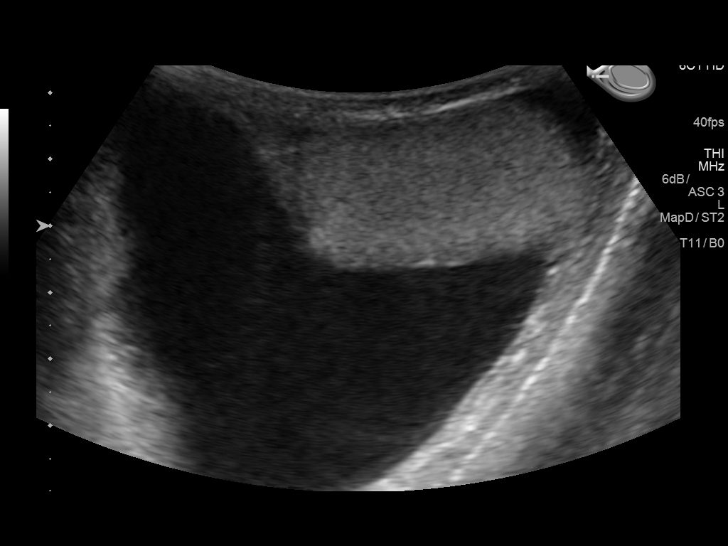
[im 71/78]
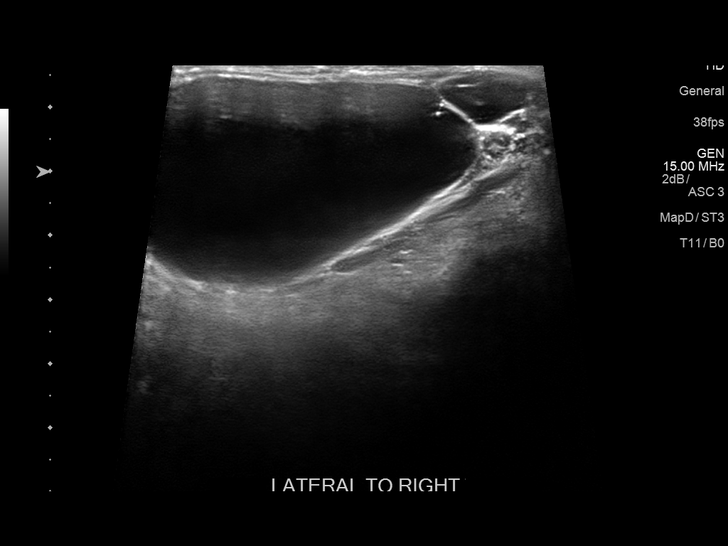
[im 78/78]
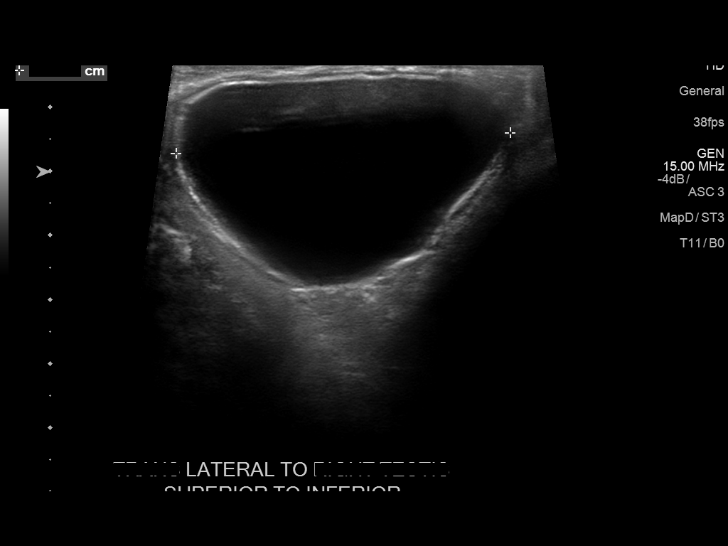

[14 of 25 positions shown; findings below may reference images not displayed]

FINDINGS: Right testicle

Measurements: 5.1 x 2.3 x 2.7 cm. No mass or microlithiasis
visualized.

Left testicle

Measurements: 4.8 x 2.1 x 3.7 cm. No mass or microlithiasis
visualized.

Right epididymis: Septated cyst superior and lateral to the right
testicle measuring 6.9 x 3.9 x 5.2 cm.

Left epididymis:  Limited visualization.

Hydrocele: There is a large left hydrocele measuring 17.8 x 11.9 x
11.3 cm.

Varicocele:  Limited visualization.
IMPRESSION: 17.8 cm left hydrocele. There is a septated cyst superior and
lateral to the right testicle measuring 6.9 cm. The bilateral testis
are normal.
